# Patient Record
Sex: Female | Born: 1940 | Race: White | Hispanic: No | Smoking: Never smoker
Health system: Southern US, Community
[De-identification: ages and names within clinical notes are randomized; demographics above are authoritative.]

## PROBLEM LIST (undated history)

## (undated) DIAGNOSIS — R51 Headache: Secondary | ICD-10-CM

## (undated) DIAGNOSIS — E785 Hyperlipidemia, unspecified: Secondary | ICD-10-CM

## (undated) DIAGNOSIS — E210 Primary hyperparathyroidism: Secondary | ICD-10-CM

## (undated) DIAGNOSIS — M1990 Unspecified osteoarthritis, unspecified site: Secondary | ICD-10-CM

## (undated) DIAGNOSIS — E209 Hypoparathyroidism, unspecified: Secondary | ICD-10-CM

## (undated) DIAGNOSIS — L570 Actinic keratosis: Secondary | ICD-10-CM

## (undated) DIAGNOSIS — E079 Disorder of thyroid, unspecified: Secondary | ICD-10-CM

## (undated) DIAGNOSIS — I6529 Occlusion and stenosis of unspecified carotid artery: Secondary | ICD-10-CM

## (undated) DIAGNOSIS — E039 Hypothyroidism, unspecified: Secondary | ICD-10-CM

## (undated) DIAGNOSIS — T148XXA Other injury of unspecified body region, initial encounter: Secondary | ICD-10-CM

## (undated) HISTORY — DX: Occlusion and stenosis of unspecified carotid artery: I65.29

## (undated) HISTORY — DX: Disorder of thyroid, unspecified: E07.9

## (undated) HISTORY — DX: Hyperlipidemia, unspecified: E78.5

## (undated) HISTORY — DX: Other injury of unspecified body region, initial encounter: T14.8XXA

## (undated) HISTORY — DX: Actinic keratosis: L57.0

---

## 1998-11-26 HISTORY — PX: CARPAL TUNNEL RELEASE: SHX101

## 2003-09-15 LAB — HM COLONOSCOPY

## 2006-06-18 ENCOUNTER — Ambulatory Visit: Payer: Self-pay | Admitting: Unknown Physician Specialty

## 2007-06-24 ENCOUNTER — Ambulatory Visit: Payer: Self-pay | Admitting: Unknown Physician Specialty

## 2008-06-30 ENCOUNTER — Ambulatory Visit: Payer: Self-pay | Admitting: Unknown Physician Specialty

## 2008-09-11 DIAGNOSIS — T148XXA Other injury of unspecified body region, initial encounter: Secondary | ICD-10-CM

## 2008-09-11 HISTORY — DX: Other injury of unspecified body region, initial encounter: T14.8XXA

## 2008-11-26 LAB — HM DEXA SCAN

## 2009-07-26 ENCOUNTER — Ambulatory Visit: Payer: Self-pay | Admitting: Unknown Physician Specialty

## 2010-08-17 ENCOUNTER — Ambulatory Visit: Payer: Self-pay | Admitting: Unknown Physician Specialty

## 2010-08-17 LAB — HM MAMMOGRAPHY

## 2011-08-05 LAB — HM PAP SMEAR

## 2011-11-27 HISTORY — PX: ROTATOR CUFF REPAIR: SHX139

## 2011-12-03 ENCOUNTER — Ambulatory Visit (INDEPENDENT_AMBULATORY_CARE_PROVIDER_SITE_OTHER): Payer: Medicare Other | Admitting: Internal Medicine

## 2011-12-03 ENCOUNTER — Encounter: Payer: Self-pay | Admitting: Internal Medicine

## 2011-12-03 DIAGNOSIS — M8580 Other specified disorders of bone density and structure, unspecified site: Secondary | ICD-10-CM | POA: Insufficient documentation

## 2011-12-03 DIAGNOSIS — I8390 Asymptomatic varicose veins of unspecified lower extremity: Secondary | ICD-10-CM

## 2011-12-03 DIAGNOSIS — M899 Disorder of bone, unspecified: Secondary | ICD-10-CM

## 2011-12-03 DIAGNOSIS — M949 Disorder of cartilage, unspecified: Secondary | ICD-10-CM | POA: Diagnosis not present

## 2011-12-03 DIAGNOSIS — Z1211 Encounter for screening for malignant neoplasm of colon: Secondary | ICD-10-CM

## 2011-12-03 DIAGNOSIS — G43909 Migraine, unspecified, not intractable, without status migrainosus: Secondary | ICD-10-CM | POA: Insufficient documentation

## 2011-12-03 DIAGNOSIS — I8393 Asymptomatic varicose veins of bilateral lower extremities: Secondary | ICD-10-CM

## 2011-12-03 DIAGNOSIS — Z1239 Encounter for other screening for malignant neoplasm of breast: Secondary | ICD-10-CM

## 2011-12-03 DIAGNOSIS — Z124 Encounter for screening for malignant neoplasm of cervix: Secondary | ICD-10-CM

## 2011-12-03 MED ORDER — LEVOTHYROXINE SODIUM 88 MCG PO TABS: 88.0000 ug | ORAL_TABLET | Freq: Every day | ORAL | Status: DC

## 2011-12-03 NOTE — Assessment & Plan Note (Signed)
Last DEXA 2011 A [facility] [place]

## 2011-12-03 NOTE — Assessment & Plan Note (Signed)
Normal with internal hemorrhoids, asymptomatic 2004.
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
## 2011-12-03 NOTE — Assessment & Plan Note (Signed)
Asymptoamtic.  Recommended use of compression stockings

## 2011-12-03 NOTE — Patient Instructions (Addendum)
You can use up to 2 grams of tylenol daily for pain relief    You can   use up to 2400 mg of advil ( 200 mg tablets are OTC , you can use up to 4 OTC at a time,  Every 8 hours if needed)    Return in March or a[pril for fasting labs

## 2011-12-03 NOTE — Assessment & Plan Note (Signed)
Due for mammogram,  Last one sept 2011

## 2011-12-19 ENCOUNTER — Ambulatory Visit: Payer: Self-pay | Admitting: Unknown Physician Specialty

## 2011-12-19 DIAGNOSIS — R920 Mammographic microcalcification found on diagnostic imaging of breast: Secondary | ICD-10-CM | POA: Diagnosis not present

## 2011-12-19 DIAGNOSIS — Z1231 Encounter for screening mammogram for malignant neoplasm of breast: Secondary | ICD-10-CM | POA: Diagnosis not present

## 2011-12-31 DIAGNOSIS — L821 Other seborrheic keratosis: Secondary | ICD-10-CM | POA: Diagnosis not present

## 2011-12-31 DIAGNOSIS — D485 Neoplasm of uncertain behavior of skin: Secondary | ICD-10-CM | POA: Diagnosis not present

## 2011-12-31 DIAGNOSIS — L988 Other specified disorders of the skin and subcutaneous tissue: Secondary | ICD-10-CM | POA: Diagnosis not present

## 2012-01-31 ENCOUNTER — Encounter: Payer: Self-pay | Admitting: Internal Medicine

## 2012-02-11 ENCOUNTER — Encounter: Payer: Self-pay | Admitting: Internal Medicine

## 2012-02-13 ENCOUNTER — Telehealth: Payer: Self-pay | Admitting: Internal Medicine

## 2012-02-13 DIAGNOSIS — E785 Hyperlipidemia, unspecified: Secondary | ICD-10-CM

## 2012-02-13 DIAGNOSIS — E039 Hypothyroidism, unspecified: Secondary | ICD-10-CM

## 2012-02-13 DIAGNOSIS — Z79899 Other long term (current) drug therapy: Secondary | ICD-10-CM

## 2012-02-13 NOTE — Telephone Encounter (Signed)
Patient is coming in for fasting labs on April 11 th I don't see any labs in the system.

## 2012-02-14 NOTE — Telephone Encounter (Signed)
ordeers placed

## 2012-02-14 NOTE — Telephone Encounter (Signed)
Patient was told to come back for labs but there are no orders.   Please advise on what labs you want patient to have.

## 2012-03-06 ENCOUNTER — Other Ambulatory Visit: Payer: Medicare Other

## 2012-03-07 ENCOUNTER — Other Ambulatory Visit (INDEPENDENT_AMBULATORY_CARE_PROVIDER_SITE_OTHER): Payer: Medicare Other | Admitting: *Deleted

## 2012-03-07 DIAGNOSIS — E785 Hyperlipidemia, unspecified: Secondary | ICD-10-CM | POA: Diagnosis not present

## 2012-03-07 DIAGNOSIS — E039 Hypothyroidism, unspecified: Secondary | ICD-10-CM

## 2012-03-07 DIAGNOSIS — Z79899 Other long term (current) drug therapy: Secondary | ICD-10-CM

## 2012-03-07 LAB — COMPREHENSIVE METABOLIC PANEL
ALT: 20 U/L (ref 0–35)
AST: 23 U/L (ref 0–37)
Albumin: 4.1 g/dL (ref 3.5–5.2)
Alkaline Phosphatase: 67 U/L (ref 39–117)
BUN: 19 mg/dL (ref 6–23)
CO2: 30 mEq/L (ref 19–32)
Calcium: 10.3 mg/dL (ref 8.4–10.5)
Chloride: 105 mEq/L (ref 96–112)
Creatinine, Ser: 0.6 mg/dL (ref 0.4–1.2)
GFR: 97.8 mL/min (ref >60.00)
Glucose, Bld: 87 mg/dL (ref 70–99)
Potassium: 4.2 mEq/L (ref 3.5–5.1)
Sodium: 141 mEq/L (ref 135–145)
Total Bilirubin: 0.5 mg/dL (ref 0.3–1.2)
Total Protein: 6.8 g/dL (ref 6.0–8.3)

## 2012-03-07 LAB — LIPID PANEL
Cholesterol: 194 mg/dL (ref 0–200)
HDL: 69.2 mg/dL (ref >39.00)
LDL Cholesterol: 113 mg/dL — ABNORMAL HIGH (ref 0–99)
Total CHOL/HDL Ratio: 3
Triglycerides: 58 mg/dL (ref 0.0–149.0)
VLDL: 11.6 mg/dL (ref 0.0–40.0)

## 2012-03-07 LAB — TSH: TSH: 0.21 u[IU]/mL — ABNORMAL LOW (ref 0.35–5.50)

## 2012-03-09 MED ORDER — LEVOTHYROXINE SODIUM 75 MCG PO TABS: 75.0000 ug | ORAL_TABLET | Freq: Every day | ORAL | Status: DC

## 2012-03-09 NOTE — Progress Notes (Signed)
Addended by: Duncan Dull on: 03/09/2012 07:51 PM   Modules accepted: Orders

## 2012-05-19 ENCOUNTER — Other Ambulatory Visit: Payer: Self-pay | Admitting: Internal Medicine

## 2012-05-21 ENCOUNTER — Other Ambulatory Visit: Payer: Self-pay | Admitting: Internal Medicine

## 2012-08-27 ENCOUNTER — Telehealth: Payer: Self-pay

## 2012-08-27 DIAGNOSIS — E039 Hypothyroidism, unspecified: Secondary | ICD-10-CM

## 2012-08-27 DIAGNOSIS — Z79899 Other long term (current) drug therapy: Secondary | ICD-10-CM

## 2012-08-27 DIAGNOSIS — E785 Hyperlipidemia, unspecified: Secondary | ICD-10-CM

## 2012-08-27 NOTE — Telephone Encounter (Signed)
Patient is requesting a 90 day supply of Simvastatin 20 mg to last her until January when she can come in for a CPE. Ok to refill?

## 2012-08-28 MED ORDER — SIMVASTATIN 20 MG PO TABS: 20.0000 mg | ORAL_TABLET | Freq: Every evening | ORAL | Status: DC

## 2012-08-28 NOTE — Telephone Encounter (Signed)
Simvastatin  20 MG #90 0 R called in to [facility]

## 2012-08-28 NOTE — Telephone Encounter (Signed)
She can, if she agrees to come in for nonfasting labs this month ;  Needs a CMET and a TSH to monitor her Liver tests and prior thyroid medication dose change.  I will add orders to [facility]

## 2012-09-23 DIAGNOSIS — Z23 Encounter for immunization: Secondary | ICD-10-CM | POA: Diagnosis not present

## 2012-09-25 ENCOUNTER — Other Ambulatory Visit (INDEPENDENT_AMBULATORY_CARE_PROVIDER_SITE_OTHER): Payer: Medicare Other

## 2012-09-25 DIAGNOSIS — Z79899 Other long term (current) drug therapy: Secondary | ICD-10-CM

## 2012-09-25 DIAGNOSIS — E039 Hypothyroidism, unspecified: Secondary | ICD-10-CM | POA: Diagnosis not present

## 2012-09-25 LAB — COMPREHENSIVE METABOLIC PANEL
ALT: 19 U/L (ref 0–35)
AST: 25 U/L (ref 0–37)
Albumin: 3.8 g/dL (ref 3.5–5.2)
Alkaline Phosphatase: 50 U/L (ref 39–117)
BUN: 19 mg/dL (ref 6–23)
CO2: 27 mEq/L (ref 19–32)
Calcium: 10.1 mg/dL (ref 8.4–10.5)
Chloride: 105 mEq/L (ref 96–112)
Creatinine, Ser: 0.7 mg/dL (ref 0.4–1.2)
GFR: 82.58 mL/min (ref >60.00)
Glucose, Bld: 86 mg/dL (ref 70–99)
Potassium: 3.8 mEq/L (ref 3.5–5.1)
Sodium: 139 mEq/L (ref 135–145)
Total Bilirubin: 0.7 mg/dL (ref 0.3–1.2)
Total Protein: 6.6 g/dL (ref 6.0–8.3)

## 2012-09-25 LAB — TSH: TSH: 1.18 u[IU]/mL (ref 0.35–5.50)

## 2012-09-30 ENCOUNTER — Encounter: Payer: Self-pay | Admitting: *Deleted

## 2012-09-30 NOTE — Progress Notes (Signed)
Result letter mailed to patient's home address.

## 2012-10-02 ENCOUNTER — Telehealth: Payer: Self-pay | Admitting: Internal Medicine

## 2012-10-02 NOTE — Telephone Encounter (Signed)
Results of her lab work.

## 2012-10-02 NOTE — Telephone Encounter (Signed)
Left message on pateint vm with normal lab results.

## 2012-12-02 LAB — HM PAP SMEAR: HM Pap smear: NORMAL

## 2012-12-04 ENCOUNTER — Encounter: Payer: Self-pay | Admitting: Internal Medicine

## 2012-12-04 ENCOUNTER — Other Ambulatory Visit (HOSPITAL_COMMUNITY)
Admission: RE | Admit: 2012-12-04 | Discharge: 2012-12-04 | Disposition: A | Discharge status: Home / Self Care | Payer: Medicare Other | Source: Ambulatory Visit | Attending: Internal Medicine | Admitting: Internal Medicine

## 2012-12-04 ENCOUNTER — Ambulatory Visit (INDEPENDENT_AMBULATORY_CARE_PROVIDER_SITE_OTHER): Payer: Medicare Other | Admitting: Internal Medicine

## 2012-12-04 VITALS — BP 124/82 | HR 74 | Temp 98.2°F | Resp 16 | Ht 63.0 in | Wt 127.0 lb

## 2012-12-04 DIAGNOSIS — Z139 Encounter for screening, unspecified: Secondary | ICD-10-CM

## 2012-12-04 DIAGNOSIS — Z1239 Encounter for other screening for malignant neoplasm of breast: Secondary | ICD-10-CM

## 2012-12-04 DIAGNOSIS — M76899 Other specified enthesopathies of unspecified lower limb, excluding foot: Secondary | ICD-10-CM | POA: Diagnosis not present

## 2012-12-04 DIAGNOSIS — Z01419 Encounter for gynecological examination (general) (routine) without abnormal findings: Secondary | ICD-10-CM | POA: Diagnosis not present

## 2012-12-04 DIAGNOSIS — Z0000 Encounter for general adult medical examination without abnormal findings: Secondary | ICD-10-CM | POA: Diagnosis not present

## 2012-12-04 DIAGNOSIS — Z1211 Encounter for screening for malignant neoplasm of colon: Secondary | ICD-10-CM | POA: Diagnosis not present

## 2012-12-04 DIAGNOSIS — E785 Hyperlipidemia, unspecified: Secondary | ICD-10-CM | POA: Diagnosis not present

## 2012-12-04 DIAGNOSIS — Z1151 Encounter for screening for human papillomavirus (HPV): Secondary | ICD-10-CM | POA: Insufficient documentation

## 2012-12-04 DIAGNOSIS — Z124 Encounter for screening for malignant neoplasm of cervix: Secondary | ICD-10-CM

## 2012-12-04 DIAGNOSIS — M7062 Trochanteric bursitis, left hip: Secondary | ICD-10-CM

## 2012-12-04 MED ORDER — SIMVASTATIN 20 MG PO TABS: 20.0000 mg | ORAL_TABLET | Freq: Every evening | ORAL | Status: DC

## 2012-12-04 MED ORDER — TETANUS-DIPHTH-ACELL PERTUSSIS 5-2.5-18.5 LF-MCG/0.5 IM SUSP: 0.5000 mL | Freq: Once | INTRAMUSCULAR | Status: DC

## 2012-12-04 NOTE — Progress Notes (Signed)
Patient ID: Raven [name], female   DOB: 10-04-1941, 72 y.o.   MRN: [id number]        The patient is here for annual Medicare wellness examination and management of other chronic and acute problems. In 2012 had right rotator cuff surgery on the right shoulder,  Lately has been having pain with sleeping ,  aggravated by lying on shoulder. Using prn NSAIDs.  2) Right hip pain points to area of ischial tuberosity. Aggravated by recent prolonged car ride on a driving trip in [place]  Not aggravated by climbing stairs  But made worse by leg abduction of opposite side    The risk factors are reflected in the social history.  The roster of all physicians providing medical care to patient - is listed in the Snapshot section of the chart.  Activities of daily living:  The patient is 100% independent in all ADLs: dressing, toileting, feeding as well as independent mobility  Home safety : The patient has smoke detectors in the home. They wear seatbelts.  There are no firearms at home. There is no violence in the home.   There is no risks for hepatitis, STDs or HIV. There is no   history of blood transfusion. They have no travel history to infectious disease endemic areas of the world.  The patient has seen their dentist in the last six month. They have seen their eye doctor in the last year. They admit to slight hearing difficulty with regard to whispered voices and some television programs.  They have deferred audiologic testing in the last year.  They do not  have excessive sun exposure. Discussed the need for sun protection: hats, long sleeves and use of sunscreen if there is significant sun exposure.   Diet: the importance of a healthy diet is discussed. They do have a healthy diet.  The benefits of regular aerobic exercise were discussed. She walks 4 times per week ,  20 minutes.   Depression screen: there are no signs or vegative symptoms of depression- irritability, change in appetite, anhedonia,  sadness/tearfullness.  Cognitive assessment: the patient manages all their financial and personal affairs and is actively engaged. They could relate day,date,year and events; recalled 2/3 objects at 3 minutes; performed clock-face test normally.  The following portions of the patient's history were reviewed and updated as appropriate: allergies, current medications, past family history, past medical history,  past surgical history, past social history  and problem list.  Visual acuity was not assessed per patient preference since she has regular follow up with her ophthalmologist. Hearing and body mass index were assessed and reviewed.   During the course of the visit the patient was educated and counseled about appropriate screening and preventive services including : fall prevention , diabetes screening, nutrition counseling, colorectal cancer screening, and recommended immunizations.    Objective:     BP 124/82  Pulse 74  Temp 98.2 F (36.8 C) (Oral)  Resp 16  Ht 5\' 3"  (1.6 m)  Wt 127 lb (57.607 kg)  BMI 22.50 kg/m2  SpO2 98%  General appearance: alert, cooperative and appears stated age Eyes: conjunctivae/corneas clear. PERRL, EOM's intact. Fundi benign. Ears: normal TM's and external ear canals both ears Nose: Nares normal. Septum midline. Mucosa normal. No drainage or sinus tenderness. Neck: no adenopathy, no carotid bruit, no JVD, supple, symmetrical, trachea midline and thyroid not enlarged, symmetric, no tenderness/mass/nodules Lungs: clear to auscultation bilaterally Breasts: normal appearance, no masses or tenderness, No nipple retraction or dimpling Heart:  regular rate and rhythm, S1, S2 normal, no murmur, click, rub or gallop Abdomen: soft, non-tender; bowel sounds normal; no masses,  no organomegaly Pelvic: cervix normal in appearance, external genitalia normal, no adnexal masses or tenderness, no bladder tenderness, no cervical motion tenderness, rectovaginal septum  normal, uterus normal size, shape, and consistency and vagina normal without discharge Extremities: extremities normal, atraumatic, no cyanosis or edema Pulses: 2+ and symmetric Skin: Skin color, texture, turgor normal. No rashes or lesions Neurologic: Alert and oriented X 3, normal strength and tone. Normal symmetric reflexes. Normal coordination and gait   Assessment and Plan  Routine general medical examination at a health care facility Breast pelvic and Pap smear were done today. Her last Pap smear was 2010.  Screening for breast cancer Breast exam normal. Mammogram ordered.  Trochanteric bursitis of left hip Range of motion is normal,  straight leg lift is normal. Two week trial of NSAIDs recommended.   Updated Medication List Outpatient Encounter Prescriptions as of 12/04/2012  Medication Sig Dispense Refill  . CALCIUM CARBONATE PO Take 1260 mg by mouth daily       . cholecalciferol (VITAMIN D) 1000 UNITS tablet Take 1,000 Units by mouth daily.       Marland Kitchen levothyroxine (SYNTHROID, LEVOTHROID) 75 MCG tablet TAKE ONE TABLET BY MOUTH ONE TIME DAILY  90 tablet  2  . simvastatin (ZOCOR) 20 MG tablet Take 1 tablet (20 mg total) by mouth every evening.  90 tablet  2  . vitamin E 400 UNIT capsule Take 400 Units by mouth daily.        . [DISCONTINUED] levothyroxine (SYNTHROID, LEVOTHROID) 88 MCG tablet TAKE ONE TABLET BY MOUTH ONE TIME DAILY  30 tablet  6  . [DISCONTINUED] simvastatin (ZOCOR) 20 MG tablet Take 1 tablet (20 mg total) by mouth every evening.  90 tablet  0  . TDaP (BOOSTRIX) 5-2.5-18.5 LF-MCG/0.5 injection Inject 0.5 mLs into the muscle once.  0.5 mL  0  . [DISCONTINUED] TDaP (BOOSTRIX) 5-2.5-18.5 LF-MCG/0.5 injection Inject 0.5 mLs into the muscle once.  0.5 mL  0

## 2012-12-04 NOTE — Patient Instructions (Addendum)
Your hip pain may be due to trochanteric bursitis or bursitis of the ischial tuberosity.  I recommend a two week trial of NSAIDs (aleve twice daily) to see if it improves  You should protect your stomach with daily prevacid or prilosec,  Take in the am with your synthroid  If no improvement, we will get an x ray and consider alternative therapies, including seeing a sports medicine doctor.   You need a TdaP and a colonoscopy.  rx for vaccine  We will call you with a GI referral for colonosocpy   vascular referral  for carotid u/s To be arranged

## 2012-12-06 ENCOUNTER — Encounter: Payer: Self-pay | Admitting: Internal Medicine

## 2012-12-06 DIAGNOSIS — Z0000 Encounter for general adult medical examination without abnormal findings: Secondary | ICD-10-CM | POA: Insufficient documentation

## 2012-12-06 DIAGNOSIS — M7061 Trochanteric bursitis, right hip: Secondary | ICD-10-CM | POA: Insufficient documentation

## 2012-12-06 NOTE — Assessment & Plan Note (Signed)
Breast pelvic and Pap smear were done today. Her last Pap smear was 2010.

## 2012-12-06 NOTE — Assessment & Plan Note (Signed)
Breast exam normal. Mammogram ordered. 

## 2012-12-06 NOTE — Assessment & Plan Note (Addendum)
Range of motion is normal,  straight leg lift is normal. Two week trial of NSAIDs recommended.

## 2012-12-10 NOTE — Progress Notes (Signed)
Quick Note:  PAP smear was normal, HPV screen negative. Repeat in 3 years   ______

## 2012-12-22 LAB — HM MAMMOGRAPHY: HM Mammogram: NORMAL

## 2012-12-30 ENCOUNTER — Telehealth: Payer: Self-pay | Admitting: General Practice

## 2012-12-30 DIAGNOSIS — M7071 Other bursitis of hip, right hip: Secondary | ICD-10-CM

## 2012-12-30 NOTE — Telephone Encounter (Signed)
LM informing pt waiting for a return call in regards to the orthopedist.

## 2012-12-30 NOTE — Telephone Encounter (Signed)
She would definitiely benefit from a steroid hip injection.  I am happy to refer her to an orthopeidst.  Does she have a preference?

## 2012-12-30 NOTE — Telephone Encounter (Signed)
PT called stating that she has taken the Aleve as you requested for 2 weeks to alleviate hip pain. Stated you thought it was bursitis at her last OV. 3 days after stopping Aleve the pain came back. Pt would like to know if she should continue the Aleve or do you want to proceed with a referral. Please advise.

## 2012-12-31 ENCOUNTER — Ambulatory Visit: Payer: Self-pay | Admitting: Internal Medicine

## 2012-12-31 DIAGNOSIS — Z1231 Encounter for screening mammogram for malignant neoplasm of breast: Secondary | ICD-10-CM | POA: Diagnosis not present

## 2013-01-01 NOTE — Telephone Encounter (Signed)
Pt called again to ask about referral.

## 2013-01-02 NOTE — Telephone Encounter (Signed)
PT order in place.

## 2013-01-02 NOTE — Telephone Encounter (Signed)
Pt states she would like to try physical therapy at [facility] if possible.

## 2013-01-02 NOTE — Addendum Note (Signed)
Addended by: Sherlene Shams on: 01/02/2013 02:04 PM   Modules accepted: Orders

## 2013-01-06 DIAGNOSIS — M7150 Other bursitis, not elsewhere classified, unspecified site: Secondary | ICD-10-CM | POA: Diagnosis not present

## 2013-01-06 DIAGNOSIS — M2559 Pain in other specified joint: Secondary | ICD-10-CM | POA: Diagnosis not present

## 2013-01-09 DIAGNOSIS — M2559 Pain in other specified joint: Secondary | ICD-10-CM | POA: Diagnosis not present

## 2013-01-09 DIAGNOSIS — M7150 Other bursitis, not elsewhere classified, unspecified site: Secondary | ICD-10-CM | POA: Diagnosis not present

## 2013-01-10 ENCOUNTER — Other Ambulatory Visit: Payer: Self-pay

## 2013-01-12 DIAGNOSIS — M2559 Pain in other specified joint: Secondary | ICD-10-CM | POA: Diagnosis not present

## 2013-01-12 DIAGNOSIS — M7150 Other bursitis, not elsewhere classified, unspecified site: Secondary | ICD-10-CM | POA: Diagnosis not present

## 2013-01-13 DIAGNOSIS — M2559 Pain in other specified joint: Secondary | ICD-10-CM | POA: Diagnosis not present

## 2013-01-13 DIAGNOSIS — M7150 Other bursitis, not elsewhere classified, unspecified site: Secondary | ICD-10-CM | POA: Diagnosis not present

## 2013-01-15 DIAGNOSIS — M2559 Pain in other specified joint: Secondary | ICD-10-CM | POA: Diagnosis not present

## 2013-01-15 DIAGNOSIS — M7150 Other bursitis, not elsewhere classified, unspecified site: Secondary | ICD-10-CM | POA: Diagnosis not present

## 2013-01-16 ENCOUNTER — Telehealth: Payer: Self-pay | Admitting: Internal Medicine

## 2013-01-16 NOTE — Telephone Encounter (Signed)
Patient Information:  Caller Name: Wrigley  Phone: [phone]  Patient: Raven [name], Raven [name]  Gender: Female  DOB: [birth year]  Age: 72 Years  PCP: Duncan Dull (Adults only)  Office Follow Up:  Does the office need to follow up with this patient?: No  Instructions For The Office: N/A   Symptoms  Reason For Call & Symptoms: Wearing steroid patch daily for 5-6 hours to treat Bursistis and when she removed it this morning and skin was red and raised in the outline of the adhesive but not itchy-01/16/13. Still has faint outline of rash this afternoon but no swelling.  Reviewed Health History In EMR: Yes  Reviewed Medications In EMR: Yes  Reviewed Allergies In EMR: Yes  Reviewed Surgeries / Procedures: Yes  Date of Onset of Symptoms: 01/16/2013  Treatments Tried: Washed area, ice applied  Treatments Tried Worked: Yes  Guideline(s) Used:  Rash or Redness - Localized  Disposition Per Guideline:   Home Care  Reason For Disposition Reached:   Mild localized rash  Advice Given: Wash area with mild soap and water after adhesive removed.  Apply Cold to the Area:  Apply or soak in cold water for 20 minutes every 3 to 4 hours to reduce itching or pain.  Call Back If:  Rash spreads or becomes worse  Rash lasts longer than 1 week  You become worse.  Expected Course:  Most of these rashes pass in 2 to 3 days.

## 2013-01-18 DIAGNOSIS — M2559 Pain in other specified joint: Secondary | ICD-10-CM | POA: Diagnosis not present

## 2013-01-18 DIAGNOSIS — M7150 Other bursitis, not elsewhere classified, unspecified site: Secondary | ICD-10-CM | POA: Diagnosis not present

## 2013-01-19 ENCOUNTER — Encounter: Payer: Self-pay | Admitting: Internal Medicine

## 2013-01-20 DIAGNOSIS — M7150 Other bursitis, not elsewhere classified, unspecified site: Secondary | ICD-10-CM | POA: Diagnosis not present

## 2013-01-20 DIAGNOSIS — M2559 Pain in other specified joint: Secondary | ICD-10-CM | POA: Diagnosis not present

## 2013-01-21 ENCOUNTER — Telehealth: Payer: Self-pay | Admitting: Internal Medicine

## 2013-01-21 NOTE — Telephone Encounter (Signed)
Fixed.

## 2013-01-21 NOTE — Telephone Encounter (Signed)
Patient was looking at her my chart and notice that it mentioned her left hip having bursitis under diagnosis and not her right she is wanting this corrected in case the insurance company is needing any of this information.

## 2013-01-22 ENCOUNTER — Telehealth: Payer: Self-pay | Admitting: Internal Medicine

## 2013-01-22 DIAGNOSIS — M2559 Pain in other specified joint: Secondary | ICD-10-CM | POA: Diagnosis not present

## 2013-01-22 DIAGNOSIS — M7150 Other bursitis, not elsewhere classified, unspecified site: Secondary | ICD-10-CM | POA: Diagnosis not present

## 2013-01-22 NOTE — Telephone Encounter (Signed)
Patient stated that she did not call about labs but a correction in my chart and it was taken care of.

## 2013-01-22 NOTE — Telephone Encounter (Signed)
Pt called to get results.  °

## 2013-01-26 DIAGNOSIS — M2559 Pain in other specified joint: Secondary | ICD-10-CM | POA: Diagnosis not present

## 2013-01-28 DIAGNOSIS — M2559 Pain in other specified joint: Secondary | ICD-10-CM | POA: Diagnosis not present

## 2013-01-30 DIAGNOSIS — M2559 Pain in other specified joint: Secondary | ICD-10-CM | POA: Diagnosis not present

## 2013-02-02 DIAGNOSIS — M2559 Pain in other specified joint: Secondary | ICD-10-CM | POA: Diagnosis not present

## 2013-02-04 DIAGNOSIS — M2559 Pain in other specified joint: Secondary | ICD-10-CM | POA: Diagnosis not present

## 2013-02-06 DIAGNOSIS — M2559 Pain in other specified joint: Secondary | ICD-10-CM | POA: Diagnosis not present

## 2013-07-01 ENCOUNTER — Other Ambulatory Visit: Payer: Self-pay

## 2013-07-15 ENCOUNTER — Other Ambulatory Visit: Payer: Self-pay | Admitting: Internal Medicine

## 2013-08-25 ENCOUNTER — Other Ambulatory Visit: Payer: Self-pay | Admitting: Internal Medicine

## 2013-08-28 DIAGNOSIS — Z23 Encounter for immunization: Secondary | ICD-10-CM | POA: Diagnosis not present

## 2013-09-01 ENCOUNTER — Encounter: Payer: Self-pay | Admitting: Internal Medicine

## 2013-09-01 ENCOUNTER — Ambulatory Visit (INDEPENDENT_AMBULATORY_CARE_PROVIDER_SITE_OTHER): Payer: Medicare Other | Admitting: Internal Medicine

## 2013-09-01 VITALS — BP 126/74 | HR 66 | Temp 97.6°F | Resp 14 | Ht 63.0 in | Wt 131.5 lb

## 2013-09-01 DIAGNOSIS — E559 Vitamin D deficiency, unspecified: Secondary | ICD-10-CM | POA: Diagnosis not present

## 2013-09-01 DIAGNOSIS — E785 Hyperlipidemia, unspecified: Secondary | ICD-10-CM | POA: Insufficient documentation

## 2013-09-01 DIAGNOSIS — Z1211 Encounter for screening for malignant neoplasm of colon: Secondary | ICD-10-CM

## 2013-09-01 DIAGNOSIS — E039 Hypothyroidism, unspecified: Secondary | ICD-10-CM

## 2013-09-01 DIAGNOSIS — Z79899 Other long term (current) drug therapy: Secondary | ICD-10-CM

## 2013-09-01 DIAGNOSIS — Z124 Encounter for screening for malignant neoplasm of cervix: Secondary | ICD-10-CM

## 2013-09-01 LAB — LIPID PANEL
Cholesterol: 208 mg/dL — ABNORMAL HIGH (ref 0–200)
HDL: 81.7 mg/dL (ref >39.00)
Total CHOL/HDL Ratio: 3
Triglycerides: 53 mg/dL (ref 0.0–149.0)
VLDL: 10.6 mg/dL (ref 0.0–40.0)

## 2013-09-01 LAB — COMPREHENSIVE METABOLIC PANEL
ALT: 21 U/L (ref 0–35)
AST: 31 U/L (ref 0–37)
Albumin: 4.2 g/dL (ref 3.5–5.2)
Alkaline Phosphatase: 50 U/L (ref 39–117)
BUN: 18 mg/dL (ref 6–23)
CO2: 28 mEq/L (ref 19–32)
Calcium: 11 mg/dL — ABNORMAL HIGH (ref 8.4–10.5)
Chloride: 108 mEq/L (ref 96–112)
Creatinine, Ser: 0.7 mg/dL (ref 0.4–1.2)
GFR: 82.36 mL/min (ref >60.00)
Glucose, Bld: 88 mg/dL (ref 70–99)
Potassium: 4.3 mEq/L (ref 3.5–5.1)
Sodium: 142 mEq/L (ref 135–145)
Total Bilirubin: 0.5 mg/dL (ref 0.3–1.2)
Total Protein: 6.6 g/dL (ref 6.0–8.3)

## 2013-09-01 LAB — LDL CHOLESTEROL, DIRECT: Direct LDL: 107.6 mg/dL

## 2013-09-01 LAB — TSH: TSH: 1.36 u[IU]/mL (ref 0.35–5.50)

## 2013-09-01 MED ORDER — ZOSTER VACCINE LIVE 19400 UNT/0.65ML ~~LOC~~ SOLR: 0.6500 mL | Freq: Once | SUBCUTANEOUS | Status: DC

## 2013-09-01 MED ORDER — TETANUS-DIPHTH-ACELL PERTUSSIS 5-2.5-18.5 LF-MCG/0.5 IM SUSP: 0.5000 mL | Freq: Once | INTRAMUSCULAR | Status: DC

## 2013-09-01 NOTE — Assessment & Plan Note (Signed)
Scheduled for 10 yr follow up this month

## 2013-09-01 NOTE — Patient Instructions (Addendum)
If your cholesterol remains under 130 on simvastatin,  It would be dine to suspend it for 6 months and check your untreated lipids in 6 months with your next annual exam   I recommend that you have your tetanus-diptheria-pertussis vaccine (TDaP) and Singles vaccines but you can get them for less $$$ at a local pharmacy or the [facility] the [facility] I have provided you.   Return in April for your annual exam
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
## 2013-09-01 NOTE — Assessment & Plan Note (Signed)
LDL is 107 and HDL is 81 on simvastatin. Liver enzymes are normal  Given her lack of cardiac risk factors, I think suspending the statin for 6 months and evaluating untreated repeating fasting lipids would be appropriate .

## 2013-09-01 NOTE — Assessment & Plan Note (Signed)
Normal PAp smear Jan 2014

## 2013-09-02 ENCOUNTER — Encounter: Payer: Self-pay | Admitting: Internal Medicine

## 2013-09-02 LAB — VITAMIN D 25 HYDROXY (VIT D DEFICIENCY, FRACTURES): Vit D, 25-Hydroxy: 45 ng/mL (ref 30–89)

## 2013-09-02 NOTE — Addendum Note (Signed)
Addended by: Sherlene Shams on: 09/02/2013 06:35 AM   Modules accepted: Orders

## 2013-09-04 NOTE — Telephone Encounter (Signed)
Mailed unread message to pt  

## 2013-10-01 ENCOUNTER — Other Ambulatory Visit: Payer: Self-pay

## 2013-10-14 ENCOUNTER — Other Ambulatory Visit (INDEPENDENT_AMBULATORY_CARE_PROVIDER_SITE_OTHER): Payer: Medicare Other

## 2013-10-14 ENCOUNTER — Telehealth: Payer: Self-pay | Admitting: *Deleted

## 2013-10-14 DIAGNOSIS — E785 Hyperlipidemia, unspecified: Secondary | ICD-10-CM

## 2013-10-14 LAB — LDL CHOLESTEROL, DIRECT: Direct LDL: 163 mg/dL

## 2013-10-14 LAB — LIPID PANEL
Cholesterol: 250 mg/dL — ABNORMAL HIGH (ref 0–200)
HDL: 73.7 mg/dL (ref >39.00)
Total CHOL/HDL Ratio: 3
Triglycerides: 46 mg/dL (ref 0.0–149.0)
VLDL: 9.2 mg/dL (ref 0.0–40.0)

## 2013-10-14 LAB — BASIC METABOLIC PANEL
BUN: 13 mg/dL (ref 6–23)
CO2: 28 mEq/L (ref 19–32)
Calcium: 10.2 mg/dL (ref 8.4–10.5)
Chloride: 106 mEq/L (ref 96–112)
Creatinine, Ser: 0.7 mg/dL (ref 0.4–1.2)
GFR: 82.33 mL/min (ref >60.00)
Glucose, Bld: 95 mg/dL (ref 70–99)
Potassium: 4.3 mEq/L (ref 3.5–5.1)
Sodium: 139 mEq/L (ref 135–145)

## 2013-10-14 NOTE — Telephone Encounter (Signed)
Patient came in for labs this morning and stated she need a refill on her thyroid medication sent to the pharmacy on file.

## 2013-10-15 ENCOUNTER — Other Ambulatory Visit: Payer: Self-pay | Admitting: Internal Medicine

## 2013-10-15 LAB — PARATHYROID HORMONE, INTACT (NO CA): PTH: 144.5 pg/mL — ABNORMAL HIGH (ref 14.0–72.0)

## 2013-10-15 LAB — CALCIUM, IONIZED

## 2013-10-15 MED ORDER — LEVOTHYROXINE SODIUM 75 MCG PO TABS: ORAL_TABLET | ORAL | Status: DC

## 2013-10-15 NOTE — Telephone Encounter (Signed)
Rx sent to pharmacy by escript  

## 2013-10-16 LAB — CALCIUM, IONIZED: Calcium, Ion: 1.28 mmol/L (ref 1.12–1.32)

## 2013-10-18 ENCOUNTER — Encounter: Payer: Self-pay | Admitting: Internal Medicine

## 2013-10-18 DIAGNOSIS — E213 Hyperparathyroidism, unspecified: Secondary | ICD-10-CM

## 2013-10-19 MED ORDER — SIMVASTATIN 20 MG PO TABS: ORAL_TABLET | ORAL | Status: DC

## 2013-10-21 ENCOUNTER — Encounter: Payer: Self-pay | Admitting: Emergency Medicine

## 2013-11-02 LAB — HM COLONOSCOPY

## 2013-11-03 DIAGNOSIS — K5730 Diverticulosis of large intestine without perforation or abscess without bleeding: Secondary | ICD-10-CM | POA: Diagnosis not present

## 2013-11-03 DIAGNOSIS — K648 Other hemorrhoids: Secondary | ICD-10-CM | POA: Diagnosis not present

## 2013-11-03 DIAGNOSIS — Z1211 Encounter for screening for malignant neoplasm of colon: Secondary | ICD-10-CM | POA: Diagnosis not present

## 2013-11-03 LAB — HM COLONOSCOPY

## 2013-11-12 ENCOUNTER — Encounter: Payer: Self-pay | Admitting: *Deleted

## 2013-11-17 NOTE — Telephone Encounter (Signed)
Mailed unread message to pt  

## 2013-12-16 ENCOUNTER — Encounter: Payer: Self-pay | Admitting: Internal Medicine

## 2013-12-16 ENCOUNTER — Ambulatory Visit (INDEPENDENT_AMBULATORY_CARE_PROVIDER_SITE_OTHER): Payer: Medicare Other | Admitting: Internal Medicine

## 2013-12-16 VITALS — BP 118/72 | HR 65 | Temp 98.0°F | Resp 12 | Ht 63.0 in | Wt 127.4 lb

## 2013-12-16 DIAGNOSIS — E213 Hyperparathyroidism, unspecified: Secondary | ICD-10-CM | POA: Diagnosis not present

## 2013-12-16 LAB — MAGNESIUM: Magnesium: 2.1 mg/dL (ref 1.5–2.5)

## 2013-12-16 LAB — PHOSPHORUS: Phosphorus: 2.8 mg/dL (ref 2.3–4.6)

## 2013-12-16 NOTE — Patient Instructions (Signed)
Please return in 3 months. Please stop at the lab. I will send you the results through [place]. Please start 500 mg calcium supplement daily with a meal >> ideally dinner.
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
## 2013-12-17 LAB — VITAMIN D 25 HYDROXY (VIT D DEFICIENCY, FRACTURES): Vit D, 25-Hydroxy: 46 ng/mL (ref 30–89)

## 2013-12-18 LAB — PTH, INTACT AND CALCIUM
Calcium: 10.6 mg/dL — ABNORMAL HIGH (ref 8.7–10.3)
PTH: 58 pg/mL (ref 15–65)

## 2013-12-19 LAB — VITAMIN D 1,25 DIHYDROXY
Vitamin D 1, 25 (OH)2 Total: 81 pg/mL — ABNORMAL HIGH (ref 18–72)
Vitamin D2 1, 25 (OH)2: <8 pg/mL
Vitamin D3 1, 25 (OH)2: 81 pg/mL

## 2013-12-25 NOTE — Addendum Note (Signed)
Addended by: Philemon Kingdom on: 12/25/2013 03:50 PM   Modules accepted: Orders

## 2013-12-31 DIAGNOSIS — H2510 Age-related nuclear cataract, unspecified eye: Secondary | ICD-10-CM | POA: Diagnosis not present

## 2014-01-04 ENCOUNTER — Encounter (INDEPENDENT_AMBULATORY_CARE_PROVIDER_SITE_OTHER): Payer: Medicare Other | Admitting: Surgery

## 2014-01-06 ENCOUNTER — Encounter (INDEPENDENT_AMBULATORY_CARE_PROVIDER_SITE_OTHER): Payer: Self-pay | Admitting: Surgery

## 2014-01-06 ENCOUNTER — Ambulatory Visit (INDEPENDENT_AMBULATORY_CARE_PROVIDER_SITE_OTHER): Payer: Medicare Other | Admitting: Surgery

## 2014-01-06 ENCOUNTER — Telehealth (INDEPENDENT_AMBULATORY_CARE_PROVIDER_SITE_OTHER): Payer: Self-pay

## 2014-01-06 DIAGNOSIS — E349 Endocrine disorder, unspecified: Secondary | ICD-10-CM | POA: Diagnosis not present
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
## 2014-01-06 NOTE — Telephone Encounter (Signed)
Order in epic for parathyroid scan. To Stevie to set up and call pt.

## 2014-01-06 NOTE — Patient Instructions (Signed)
Parathyroidectomy A parathyroidectomy is surgery to remove one or more parathyroid glands. These glands produce a hormone (parathyroid hormone) that helps control the level of calcium in your body. The glands are very small, about the size of a pea. They are located in your neck, close to your thyroid gland and your Adam's apple. Most people (85%) have four parathyroid glands,some people may have one or two more than that. Hyperparathyroidism is when too much parathyroid hormone is being produced. Usually this is caused by one of the parathyroid glands becoming enlarged, but it can also be caused by more than one of the glands. Hyperparathyroidism is found during blood tests that show high calcium in the blood. Parathyroid hormone levels will also be elevated. Cancer also can cause hyperparathyroidism, but this is rare. For the most common type of hyperparathyroidism, the treatment is surgical removal of the parathyroid gland that is enlarged. For patients with kidney failure and hyperparathyroidism, other treatment will be tried before surgery is done on the parathyroid.  Many times x-ray studies are done to find out which parathyroid gland or glands is malfunctioning. The decision about the best treatment for hyperparathyroidism is between the patient, their primary doctor, an endocrinologist, and a surgeon experienced in parathyroid surgery. LET YOUR CAREGIVER KNOW ABOUT:  Any allergies.  All medications you are taking, including:  Herbs, eyedrops, over-the-counter medications and creams.  Blood thinners (anticoagulants), aspirin or other drugs that could affect blood clotting.  Use of steroids (by mouth or as creams).  Previous problems with anesthetics, including local anesthetics.  Possibility of pregnancy, if this applies.  Any history of blood clots.  Any history of bleeding or other blood problems.  Previous surgery.  Smoking history.  Other health problems. RISKS AND  COMPLICATIONS   Short-term possibilities include:  Excessive bleeding.  Pain.  Infection near the incision.  Slow healing.  Pooling of blood under the wound (hematoma).  Damage to nerves in your neck.  Blood clots.  Difficulty breathing. This is very rare. It also is almost always temporary.  Longer-term possibilities include:  Scarring.  Skin damage.  Damage to blood vessels in the area.  Need for additional surgery.  A hoarse or weak voice. This is usually temporary. It can be the result of nerve damage.  Development of hypoparathyroidism. This means you are not making enough parathyroid hormone. It is rare. If it occurs, you will need to take calcium supplements daily. BEFORE THE PROCEDURE  Sometimes the surgery is done on an outpatient basis. This means you could go home the same day as your surgery. Other times, people need to stay in the hospital overnight. Ask your surgeon what you should expect.  If your surgery will be an outpatient procedure, arrange for someone to drive you home after the surgery.  Two weeks before your surgery, stop using aspirin and non-steroidal anti-inflammatory drugs (NSAID's) for pain relief. This includes prescription drugs and over-the-counter drugs such as ibuprofen and naproxen. Also stop taking vitamin E.  If you take blood-thinners, ask your healthcare provider when you should stop taking them.  Do not eat or drink for about 8 hours before your surgery.  You might be asked to shower or wash with a special antibacterial soap before the procedure.  Arrive at least an hour before the surgery, or whenever your surgeon recommends. This will give you time to check in and fill out any needed paperwork. PROCEDURE  The preparation:  You will change into a hospital gown.  You  will be given an IV. A needle will be inserted in your arm. Medication will be able to flow directly into your body through this needle.  You might be given a  sedative to help you relax.  You will be given a drug that puts you to sleep during the surgery (general anesthetic).  The procedure:  Once you are asleep, the surgeon will make a small cut (incision) in your lower neck. Ask your surgeon where the incision will be.  The surgeon will look for the gland(s) that are not working well. Often a tissue sample from a gland is used to determine this.  Any glands that are not working well will be removed.  The surgeon will close the incision with stitches, often these are hidden under the skin. AFTER THE PROCEDURE  You will stay in a recovery area until the anesthesia has worn off. Your blood pressure and heart rate will be checked.  If your surgery was an outpatient procedure, you will go home the same day.  If you need to stay in the hospital, you will be moved to a hospital room. You will probably stay for two to three days. This will depend on how quickly you recover.  While you are in the hospital, your blood will be tested to check the calcium levels in your body. HOME CARE INSTRUCTIONS   Take any medication that your surgeon prescribes. Follow the directions carefully. Take all of the medication.  Ask your surgeon whether you can take over-the-counter medicines for pain, discomfort or fever. Do not take aspirin without permission from the surgeon. Aspirin increases the chances of bleeding.  Do not get the wound wet for the first few days after surgery (or until the surgeon tells you it is OK).  After this procedure, many patients may develop low calcium levels in the blood. It is critical that you see your medical caregiver to have this monitored and managed.     SEEK MEDICAL CARE IF:   You notice blood or fluid leaking from the wound, or it becomes red or swollen.  You have trouble breathing.  You have trouble speaking.  You become nauseous or throw up for more than two days after the surgery.  You have a fever or  persistent symptoms for more than 2-3 days. SEEK IMMEDIATE MEDICAL CARE IF:   Breathing becomes more difficult.  You have a fever and your symptoms suddenly get worse. Document Released: 02/08/2009 Document Revised: 10/29/2012 Document Reviewed: 02/08/2009 [facility] Patient Information 2014 [place], [place].

## 2014-01-07 ENCOUNTER — Encounter (INDEPENDENT_AMBULATORY_CARE_PROVIDER_SITE_OTHER): Payer: Self-pay | Admitting: Surgery

## 2014-01-07 ENCOUNTER — Encounter: Payer: Self-pay | Admitting: Internal Medicine

## 2014-01-07 ENCOUNTER — Telehealth (INDEPENDENT_AMBULATORY_CARE_PROVIDER_SITE_OTHER): Payer: Self-pay | Admitting: *Deleted

## 2014-01-07 NOTE — Telephone Encounter (Signed)
I spoke with pt and informed her of the parathyroid scan scheduled at [facility] on 01/19/14 with an arrival time of 7:45am.  She is agreeable with this appt.

## 2014-01-08 DIAGNOSIS — E349 Endocrine disorder, unspecified: Secondary | ICD-10-CM | POA: Diagnosis not present

## 2014-01-08 LAB — CALCIUM, URINE, 24 HOUR
Calcium, 24H Urine: 436.8 mg/24 hr — ABNORMAL HIGH (ref 100.0–300.0)
Calcium, Ur: 27.3 mg/dL

## 2014-01-11 NOTE — Telephone Encounter (Signed)
FYI

## 2014-01-12 NOTE — Progress Notes (Signed)
Quick Note:  24-hour urine calcium markedly elevated consistent with primary hyperparathyroidism.  Await nuclear med scan.  Earnstine Regal, MD, [facility] Office: [phone]    ______

## 2014-01-15 ENCOUNTER — Encounter: Payer: Self-pay | Admitting: Internal Medicine

## 2014-01-19 ENCOUNTER — Encounter (HOSPITAL_COMMUNITY)
Admission: RE | Admit: 2014-01-19 | Discharge: 2014-01-19 | Disposition: A | Discharge status: Home / Self Care | Payer: Medicare Other | Source: Ambulatory Visit | Attending: Surgery | Admitting: Surgery

## 2014-01-19 DIAGNOSIS — E213 Hyperparathyroidism, unspecified: Secondary | ICD-10-CM | POA: Diagnosis not present

## 2014-01-19 DIAGNOSIS — E349 Endocrine disorder, unspecified: Secondary | ICD-10-CM

## 2014-01-19 DIAGNOSIS — E0590 Thyrotoxicosis, unspecified without thyrotoxic crisis or storm: Secondary | ICD-10-CM | POA: Diagnosis not present

## 2014-01-19 MED ORDER — TECHNETIUM TC 99M SESTAMIBI - CARDIOLITE
24.3000 | Freq: Once | INTRAVENOUS | Status: AC | PRN
  Administered 2014-01-19: 24 via INTRAVENOUS

## 2014-01-20 ENCOUNTER — Telehealth (INDEPENDENT_AMBULATORY_CARE_PROVIDER_SITE_OTHER): Payer: Self-pay | Admitting: Surgery

## 2014-01-20 ENCOUNTER — Telehealth (INDEPENDENT_AMBULATORY_CARE_PROVIDER_SITE_OTHER): Payer: Self-pay

## 2014-01-20 ENCOUNTER — Other Ambulatory Visit (INDEPENDENT_AMBULATORY_CARE_PROVIDER_SITE_OTHER): Payer: Self-pay | Admitting: Surgery

## 2014-01-20 DIAGNOSIS — E349 Endocrine disorder, unspecified: Secondary | ICD-10-CM

## 2014-01-20 NOTE — Telephone Encounter (Signed)
Orders for surgery in epic and surgery scheduling form to surgery schedulers to call pt.

## 2014-01-20 NOTE — Progress Notes (Signed)
Quick Note:  Scan localizes left inferior parathyroid adenoma. Patient is good candidate for out-patient surgery. Will proceed with scheduling.  Earnstine Regal, MD, [facility] Office: [phone]    ______

## 2014-01-20 NOTE — Telephone Encounter (Signed)
Telephone call to patient with results of 24-hour urine collection for calcium and nuclear medicine parathyroid scan. 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 Patient is a good candidate for minimally invasive parathyroidectomy as an outpatient surgery. We will schedule that procedure in the near future at a time convenient for the patient.  The risks and benefits of the procedure have been discussed at length with the patient.  The patient understands the proposed procedure, potential alternative treatments, and the course of recovery to be expected.  All of the patient's questions have been answered at this time.  The patient wishes to proceed with surgery.  Earnstine Regal, MD, [facility] Office: [phone]

## 2014-01-25 ENCOUNTER — Telehealth (INDEPENDENT_AMBULATORY_CARE_PROVIDER_SITE_OTHER): Payer: Self-pay | Admitting: Surgery

## 2014-01-25 DIAGNOSIS — E349 Endocrine disorder, unspecified: Secondary | ICD-10-CM

## 2014-01-25 NOTE — Telephone Encounter (Signed)
Telephone call to patient in order to answer her questions.  No answer.  Left message with instructions to call office on 3/4 and talk to my nurse.  Earnstine Regal, MD, [facility] Office: [phone]

## 2014-01-27 ENCOUNTER — Telehealth (INDEPENDENT_AMBULATORY_CARE_PROVIDER_SITE_OTHER): Payer: Self-pay | Admitting: General Surgery

## 2014-01-27 NOTE — Telephone Encounter (Signed)
Pt called to ask what dosage of calcium and Vitamin D she needs to be taking at this point.  Please advise and call pt.

## 2014-01-29 NOTE — Telephone Encounter (Signed)
Patient on schedule for parathyroidectomy.  Does not need to take supplemental calcium, but can take 600 mg daily if she wants.  Vit D supplement should be 1000 IU daily.  Earnstine Regal, MD, [facility] Office: [phone]

## 2014-01-29 NOTE — Telephone Encounter (Signed)
Pt advised attached msg below. Pt states she understands and will follow Dr Gala Lewandowsky recommendation.

## 2014-02-15 ENCOUNTER — Encounter (INDEPENDENT_AMBULATORY_CARE_PROVIDER_SITE_OTHER): Payer: Self-pay

## 2014-02-24 ENCOUNTER — Encounter (INDEPENDENT_AMBULATORY_CARE_PROVIDER_SITE_OTHER): Payer: Medicare Other | Admitting: Surgery

## 2014-03-05 ENCOUNTER — Encounter (INDEPENDENT_AMBULATORY_CARE_PROVIDER_SITE_OTHER): Payer: Self-pay

## 2014-03-11 ENCOUNTER — Encounter (HOSPITAL_COMMUNITY): Payer: Self-pay

## 2014-03-11 ENCOUNTER — Encounter (HOSPITAL_COMMUNITY): Payer: Self-pay | Admitting: Pharmacy Technician

## 2014-03-11 ENCOUNTER — Ambulatory Visit (HOSPITAL_COMMUNITY)
Admission: RE | Admit: 2014-03-11 | Discharge: 2014-03-11 | Disposition: A | Discharge status: Home / Self Care | Payer: Medicare Other | Source: Ambulatory Visit | Attending: Anesthesiology | Admitting: Anesthesiology

## 2014-03-11 ENCOUNTER — Encounter (HOSPITAL_COMMUNITY)
Admission: RE | Admit: 2014-03-11 | Discharge: 2014-03-11 | Disposition: A | Discharge status: Home / Self Care | Payer: Medicare Other | Source: Ambulatory Visit | Attending: Surgery | Admitting: Surgery

## 2014-03-11 DIAGNOSIS — Z01818 Encounter for other preprocedural examination: Secondary | ICD-10-CM | POA: Diagnosis not present

## 2014-03-11 DIAGNOSIS — Z01810 Encounter for preprocedural cardiovascular examination: Secondary | ICD-10-CM | POA: Diagnosis not present

## 2014-03-11 DIAGNOSIS — Z01812 Encounter for preprocedural laboratory examination: Secondary | ICD-10-CM | POA: Diagnosis not present

## 2014-03-11 HISTORY — DX: Unspecified osteoarthritis, unspecified site: M19.90

## 2014-03-11 HISTORY — DX: Headache: R51

## 2014-03-11 HISTORY — DX: Hypoparathyroidism, unspecified: E20.9

## 2014-03-11 HISTORY — DX: Hypothyroidism, unspecified: E03.9

## 2014-03-11 HISTORY — DX: Primary hyperparathyroidism: E21.0

## 2014-03-11 LAB — CBC
HCT: 39.9 % (ref 36.0–46.0)
Hemoglobin: 13.1 g/dL (ref 12.0–15.0)
MCH: 31.4 pg (ref 26.0–34.0)
MCHC: 32.8 g/dL (ref 30.0–36.0)
MCV: 95.7 fL (ref 78.0–100.0)
Platelets: 232 10*3/uL (ref 150–400)
RBC: 4.17 MIL/uL (ref 3.87–5.11)
RDW: 12.9 % (ref 11.5–15.5)
WBC: 5.1 10*3/uL (ref 4.0–10.5)

## 2014-03-11 LAB — BASIC METABOLIC PANEL
BUN: 15 mg/dL (ref 6–23)
CO2: 30 mEq/L (ref 19–32)
Calcium: 11 mg/dL — ABNORMAL HIGH (ref 8.4–10.5)
Chloride: 103 mEq/L (ref 96–112)
Creatinine, Ser: 0.8 mg/dL (ref 0.50–1.10)
GFR calc Af Amer: 85 mL/min — ABNORMAL LOW (ref >90)
GFR calc non Af Amer: 73 mL/min — ABNORMAL LOW (ref >90)
Glucose, Bld: 96 mg/dL (ref 70–99)
Potassium: 4.7 mEq/L (ref 3.7–5.3)
Sodium: 142 mEq/L (ref 137–147)

## 2014-03-11 NOTE — Patient Instructions (Signed)
NO ASPIRIN OR HERBALS FOR 5 DAYS BEFORE SURGERY.  YOUR SURGERY IS SCHEDULED AT [facility] [address]  ON:  Tuesday  4/28  REPORT TO  SHORT STAY CENTER AT:  5:30 AM      PHONE # FOR SHORT STAY IS [phone]  DO NOT EAT OR DRINK ANYTHING AFTER MIDNIGHT THE NIGHT BEFORE YOUR SURGERY.  YOU MAY BRUSH YOUR TEETH, RINSE OUT YOUR MOUTH--BUT NO WATER, NO FOOD, NO CHEWING GUM, NO MINTS, NO CANDIES, NO CHEWING TOBACCO.  PLEASE TAKE THE FOLLOWING MEDICATIONS THE AM OF YOUR SURGERY WITH A FEW SIPS OF WATER:  LEVOTHYROXINE  DO NOT BRING VALUABLES, MONEY, CREDIT CARDS.  DO NOT WEAR JEWELRY, MAKE-UP, NAIL POLISH AND NO METAL PINS OR CLIPS IN YOUR HAIR. CONTACT LENS, DENTURES / PARTIALS, GLASSES SHOULD NOT BE WORN TO SURGERY AND IN MOST CASES-HEARING AIDS WILL NEED TO BE REMOVED.  BRING YOUR GLASSES CASE, ANY EQUIPMENT NEEDED FOR YOUR CONTACT LENS. FOR PATIENTS ADMITTED TO THE HOSPITAL--CHECK OUT TIME THE DAY OF DISCHARGE IS 11:00 AM.  ALL INPATIENT ROOMS ARE PRIVATE - WITH BATHROOM, TELEPHONE, TELEVISION AND WIFI INTERNET.  IF YOU ARE BEING DISCHARGED THE SAME DAY OF YOUR SURGERY--YOU CAN NOT DRIVE YOURSELF HOME--AND SHOULD NOT GO HOME ALONE BY TAXI OR BUS.  NO DRIVING OR OPERATING MACHINERY, OR MAKING LEGAL DECISIONS FOR 24 HOURS FOLLOWING ANESTHESIA / PAIN MEDICATIONS.  PLEASE MAKE ARRANGEMENTS FOR SOMEONE TO BE WITH YOU AT HOME THE FIRST 24 HOURS AFTER SURGERY. RESPONSIBLE DRIVER'S NAME / PHONE                                                     FAILURE TO FOLLOW THESE INSTRUCTIONS MAY RESULT IN THE CANCELLATION OF YOUR SURGERY. PLEASE BE AWARE THAT YOU MAY NEED ADDITIONAL BLOOD DRAWN DAY OF YOUR SURGERY  PATIENT SIGNATURE_________________________________

## 2014-03-11 NOTE — Progress Notes (Signed)
Quick Note:  Pre-operative chest x-ray is acceptable for scheduled surgery.  Loyola Santino M. Ruwayda Curet, MD, FACS [facility] Office: [phone]   ______ 

## 2014-03-11 NOTE — Progress Notes (Signed)
Quick Note:  EKG is acceptable for scheduled surgery.  Raven Dedmon M. Breaunna Gottlieb, MD, FACS [facility] Office: [phone]   ______ 

## 2014-03-11 NOTE — Progress Notes (Signed)
Quick Note:  These results are acceptable for scheduled surgery.  Vertie Dibbern M. Veralyn Lopp, MD, FACS [facility] Office: [phone]   ______ 

## 2014-03-11 NOTE — Pre-Procedure Instructions (Signed)
EKG AND CXR WERE DONE TODAY - PREOP AT [facility]. 

## 2014-03-17 ENCOUNTER — Ambulatory Visit: Payer: Medicare Other | Admitting: Internal Medicine

## 2014-03-22 ENCOUNTER — Ambulatory Visit: Payer: Medicare Other | Admitting: Internal Medicine

## 2014-03-22 NOTE — Anesthesia Preprocedure Evaluation (Addendum)
Anesthesia Evaluation  Patient identified by MRN, date of birth, ID band Patient awake    Reviewed: Allergy & Precautions, H&P , NPO status , Patient's Chart, lab work & pertinent test results  Airway Mallampati: II TM Distance: >3 FB Neck ROM: Full    Dental no notable dental hx.    Pulmonary neg pulmonary ROS,  breath sounds clear to auscultation  Pulmonary exam normal       Cardiovascular Rhythm:Regular Rate:Normal     Neuro/Psych negative neurological ROS  negative psych ROS   GI/Hepatic negative GI ROS, Neg liver ROS,   Endo/Other  Hypothyroidism   Renal/GU negative Renal ROS  negative genitourinary   Musculoskeletal negative musculoskeletal ROS (+)   Abdominal   Peds negative pediatric ROS (+)  Hematology negative hematology ROS (+)   Anesthesia Other Findings   Reproductive/Obstetrics negative OB ROS                          Anesthesia Physical Anesthesia Plan  ASA: II  Anesthesia Plan: General   Post-op Pain Management:    Induction: Intravenous  Airway Management Planned: Oral ETT  Additional Equipment:   Intra-op Plan:   Post-operative Plan: Extubation in OR  Informed Consent: I have reviewed the patients History and Physical, chart, labs and discussed the procedure including the risks, benefits and alternatives for the proposed anesthesia with the patient or authorized representative who has indicated his/her understanding and acceptance.   Dental advisory given  Plan Discussed with: CRNA and Surgeon  Anesthesia Plan Comments:         Anesthesia Quick Evaluation

## 2014-03-23 ENCOUNTER — Encounter (HOSPITAL_COMMUNITY): Payer: Medicare Other | Admitting: Anesthesiology

## 2014-03-23 ENCOUNTER — Encounter (HOSPITAL_COMMUNITY): Payer: Self-pay | Admitting: *Deleted

## 2014-03-23 ENCOUNTER — Other Ambulatory Visit (INDEPENDENT_AMBULATORY_CARE_PROVIDER_SITE_OTHER): Payer: Self-pay

## 2014-03-23 ENCOUNTER — Telehealth (INDEPENDENT_AMBULATORY_CARE_PROVIDER_SITE_OTHER): Payer: Self-pay

## 2014-03-23 ENCOUNTER — Ambulatory Visit (HOSPITAL_COMMUNITY): Payer: Medicare Other | Admitting: Anesthesiology

## 2014-03-23 ENCOUNTER — Encounter (HOSPITAL_COMMUNITY)
Admission: RE | Disposition: A | Discharge status: Home / Self Care | Payer: Self-pay | Source: Ambulatory Visit | Attending: Surgery

## 2014-03-23 ENCOUNTER — Ambulatory Visit: Payer: Medicare Other | Admitting: Internal Medicine

## 2014-03-23 ENCOUNTER — Ambulatory Visit (HOSPITAL_COMMUNITY)
Admission: RE | Admit: 2014-03-23 | Discharge: 2014-03-23 | Disposition: A | Discharge status: Home / Self Care | Payer: Medicare Other | Source: Ambulatory Visit | Attending: Surgery | Admitting: Surgery

## 2014-03-23 DIAGNOSIS — E892 Postprocedural hypoparathyroidism: Secondary | ICD-10-CM | POA: Diagnosis present

## 2014-03-23 DIAGNOSIS — E349 Endocrine disorder, unspecified: Secondary | ICD-10-CM | POA: Diagnosis present

## 2014-03-23 DIAGNOSIS — M810 Age-related osteoporosis without current pathological fracture: Secondary | ICD-10-CM | POA: Insufficient documentation

## 2014-03-23 DIAGNOSIS — D34 Benign neoplasm of thyroid gland: Secondary | ICD-10-CM | POA: Diagnosis not present

## 2014-03-23 DIAGNOSIS — E210 Primary hyperparathyroidism: Secondary | ICD-10-CM | POA: Diagnosis not present

## 2014-03-23 DIAGNOSIS — Z79899 Other long term (current) drug therapy: Secondary | ICD-10-CM | POA: Insufficient documentation

## 2014-03-23 DIAGNOSIS — Z9889 Other specified postprocedural states: Secondary | ICD-10-CM | POA: Diagnosis present

## 2014-03-23 DIAGNOSIS — R7989 Other specified abnormal findings of blood chemistry: Secondary | ICD-10-CM | POA: Diagnosis present

## 2014-03-23 DIAGNOSIS — E785 Hyperlipidemia, unspecified: Secondary | ICD-10-CM | POA: Diagnosis not present

## 2014-03-23 DIAGNOSIS — E039 Hypothyroidism, unspecified: Secondary | ICD-10-CM | POA: Insufficient documentation

## 2014-03-23 DIAGNOSIS — D351 Benign neoplasm of parathyroid gland: Secondary | ICD-10-CM | POA: Diagnosis not present

## 2014-03-23 HISTORY — PX: PARATHYROIDECTOMY: SHX19

## 2014-03-23 SURGERY — PARATHYROIDECTOMY
Anesthesia: General | Site: Neck | Laterality: Left

## 2014-03-23 MED ORDER — NEOSTIGMINE METHYLSULFATE 1 MG/ML IJ SOLN
INTRAMUSCULAR | Status: DC | PRN
  Administered 2014-03-23: 3 mg via INTRAVENOUS

## 2014-03-23 MED ORDER — LIDOCAINE HCL (CARDIAC) 20 MG/ML IV SOLN
INTRAVENOUS | Status: DC | PRN
  Administered 2014-03-23: 25 mg via INTRAVENOUS

## 2014-03-23 MED ORDER — LIDOCAINE HCL (CARDIAC) 20 MG/ML IV SOLN
INTRAVENOUS | Status: AC
  Filled 2014-03-23: qty 5

## 2014-03-23 MED ORDER — EPHEDRINE SULFATE 50 MG/ML IJ SOLN
INTRAMUSCULAR | Status: DC | PRN
  Administered 2014-03-23 (×2): 10 mg via INTRAVENOUS

## 2014-03-23 MED ORDER — LACTATED RINGERS IV SOLN
INTRAVENOUS | Status: DC | PRN
  Administered 2014-03-23: 07:00:00 via INTRAVENOUS
  Administered 2014-03-23: 1000 mL

## 2014-03-23 MED ORDER — EPHEDRINE SULFATE 50 MG/ML IJ SOLN
INTRAMUSCULAR | Status: AC
  Filled 2014-03-23: qty 1

## 2014-03-23 MED ORDER — HYDROMORPHONE HCL PF 1 MG/ML IJ SOLN: 0.2500 mg | INTRAMUSCULAR | Status: DC | PRN

## 2014-03-23 MED ORDER — DEXAMETHASONE SODIUM PHOSPHATE 10 MG/ML IJ SOLN
INTRAMUSCULAR | Status: DC | PRN
  Administered 2014-03-23: 6 mg via INTRAVENOUS

## 2014-03-23 MED ORDER — DEXAMETHASONE SODIUM PHOSPHATE 10 MG/ML IJ SOLN
INTRAMUSCULAR | Status: AC
  Filled 2014-03-23: qty 1

## 2014-03-23 MED ORDER — HYDROCODONE-ACETAMINOPHEN 5-325 MG PO TABS
1.0000 | ORAL_TABLET | ORAL | Status: DC | PRN
  Administered 2014-03-23: 1 via ORAL
  Filled 2014-03-23: qty 1

## 2014-03-23 MED ORDER — LIDOCAINE HCL 4 % MT SOLN
OROMUCOSAL | Status: DC | PRN
  Administered 2014-03-23: 4 mL via TOPICAL

## 2014-03-23 MED ORDER — CISATRACURIUM BESYLATE (PF) 10 MG/5ML IV SOLN
INTRAVENOUS | Status: DC | PRN
  Administered 2014-03-23: 4 mg via INTRAVENOUS

## 2014-03-23 MED ORDER — CEFAZOLIN SODIUM-DEXTROSE 2-3 GM-% IV SOLR
2.0000 g | INTRAVENOUS | Status: AC
  Administered 2014-03-23: 2 g via INTRAVENOUS

## 2014-03-23 MED ORDER — 0.9 % SODIUM CHLORIDE (POUR BTL) OPTIME
TOPICAL | Status: DC | PRN
  Administered 2014-03-23: 1000 mL

## 2014-03-23 MED ORDER — BUPIVACAINE-EPINEPHRINE 0.25% -1:200000 IJ SOLN
INTRAMUSCULAR | Status: DC | PRN
  Administered 2014-03-23: 10 mL

## 2014-03-23 MED ORDER — PROPOFOL 10 MG/ML IV BOLUS
INTRAVENOUS | Status: DC | PRN
  Administered 2014-03-23: 110 mg via INTRAVENOUS

## 2014-03-23 MED ORDER — BUPIVACAINE-EPINEPHRINE (PF) 0.25% -1:200000 IJ SOLN
INTRAMUSCULAR | Status: AC
  Filled 2014-03-23: qty 30

## 2014-03-23 MED ORDER — PROMETHAZINE HCL 25 MG/ML IJ SOLN: 6.2500 mg | INTRAMUSCULAR | Status: DC | PRN

## 2014-03-23 MED ORDER — HYDROCODONE-ACETAMINOPHEN 5-325 MG PO TABS: 1.0000 | ORAL_TABLET | ORAL | Status: DC | PRN

## 2014-03-23 MED ORDER — SUCCINYLCHOLINE CHLORIDE 20 MG/ML IJ SOLN
INTRAMUSCULAR | Status: DC | PRN
Start: 2014-03-23 — End: 2014-03-23
  Administered 2014-03-23: 60 mg via INTRAVENOUS

## 2014-03-23 MED ORDER — ONDANSETRON HCL 4 MG/2ML IJ SOLN
INTRAMUSCULAR | Status: AC
  Filled 2014-03-23: qty 2

## 2014-03-23 MED ORDER — ONDANSETRON HCL 4 MG/2ML IJ SOLN
INTRAMUSCULAR | Status: DC | PRN
  Administered 2014-03-23: 4 mg via INTRAVENOUS

## 2014-03-23 MED ORDER — FENTANYL CITRATE 0.05 MG/ML IJ SOLN
INTRAMUSCULAR | Status: AC
  Filled 2014-03-23: qty 5

## 2014-03-23 MED ORDER — GLYCOPYRROLATE 0.2 MG/ML IJ SOLN
INTRAMUSCULAR | Status: AC
  Filled 2014-03-23: qty 2

## 2014-03-23 MED ORDER — CISATRACURIUM BESYLATE 20 MG/10ML IV SOLN
INTRAVENOUS | Status: AC
  Filled 2014-03-23: qty 10

## 2014-03-23 MED ORDER — GLYCOPYRROLATE 0.2 MG/ML IJ SOLN
INTRAMUSCULAR | Status: DC | PRN
  Administered 2014-03-23: .4 mg via INTRAVENOUS

## 2014-03-23 MED ORDER — KETOROLAC TROMETHAMINE 30 MG/ML IJ SOLN: 15.0000 mg | Freq: Once | INTRAMUSCULAR | Status: DC | PRN

## 2014-03-23 MED ORDER — FENTANYL CITRATE 0.05 MG/ML IJ SOLN
INTRAMUSCULAR | Status: DC | PRN
  Administered 2014-03-23 (×2): 50 ug via INTRAVENOUS
  Administered 2014-03-23: 100 ug via INTRAVENOUS

## 2014-03-23 MED ORDER — PROPOFOL 10 MG/ML IV BOLUS
INTRAVENOUS | Status: AC
  Filled 2014-03-23: qty 20

## 2014-03-23 MED ORDER — SODIUM CHLORIDE 0.9 % IJ SOLN
INTRAMUSCULAR | Status: AC
  Filled 2014-03-23: qty 10

## 2014-03-23 MED ORDER — CEFAZOLIN SODIUM-DEXTROSE 2-3 GM-% IV SOLR
INTRAVENOUS | Status: AC
  Filled 2014-03-23: qty 50

## 2014-03-23 SURGICAL SUPPLY — 40 items
ATTRACTOMAT 16X20 MAGNETIC DRP (DRAPES) ×2 IMPLANT
BENZOIN TINCTURE PRP APPL 2/3 (GAUZE/BANDAGES/DRESSINGS) ×2 IMPLANT
BLADE HEX COATED 2.75 (ELECTRODE) ×2 IMPLANT
BLADE SURG 15 STRL LF DISP TIS (BLADE) ×1 IMPLANT
BLADE SURG 15 STRL SS (BLADE) ×1
CANISTER SUCTION 2500CC (MISCELLANEOUS) IMPLANT
CHLORAPREP W/TINT 10.5 ML (MISCELLANEOUS) ×2 IMPLANT
CLIP TI MEDIUM 6 (CLIP) ×2 IMPLANT
CLIP TI WIDE RED SMALL 6 (CLIP) ×2 IMPLANT
DRAPE PED LAPAROTOMY (DRAPES) ×2 IMPLANT
DRESSING SURGICEL FIBRLLR 1X2 (HEMOSTASIS) ×1 IMPLANT
DRSG SURGICEL FIBRILLAR 1X2 (HEMOSTASIS) ×2
ELECT REM PT RETURN 9FT ADLT (ELECTROSURGICAL) ×2
ELECTRODE REM PT RTRN 9FT ADLT (ELECTROSURGICAL) ×1 IMPLANT
GAUZE SPONGE 4X4 16PLY XRAY LF (GAUZE/BANDAGES/DRESSINGS) ×2 IMPLANT
GLOVE BIOGEL PI IND STRL 7.5 (GLOVE) ×1 IMPLANT
GLOVE BIOGEL PI INDICATOR 7.5 (GLOVE) ×1
GLOVE SS BIOGEL STRL SZ 7.5 (GLOVE) ×1 IMPLANT
GLOVE SUPERSENSE BIOGEL SZ 7.5 (GLOVE) ×1
GLOVE SURG ORTHO 8.0 STRL STRW (GLOVE) ×2 IMPLANT
GLOVE SURG SS PI 6.5 STRL IVOR (GLOVE) ×2 IMPLANT
GOWN STRL REUS W/TWL XL LVL3 (GOWN DISPOSABLE) ×4 IMPLANT
KIT BASIN OR (CUSTOM PROCEDURE TRAY) ×2 IMPLANT
NEEDLE HYPO 25X1 1.5 SAFETY (NEEDLE) ×2 IMPLANT
NS IRRIG 1000ML POUR BTL (IV SOLUTION) ×2 IMPLANT
PACK BASIC VI WITH GOWN DISP (CUSTOM PROCEDURE TRAY) ×2 IMPLANT
PENCIL BUTTON HOLSTER BLD 10FT (ELECTRODE) ×2 IMPLANT
STAPLER VISISTAT 35W (STAPLE) IMPLANT
STRIP CLOSURE SKIN 1/2X4 (GAUZE/BANDAGES/DRESSINGS) ×2 IMPLANT
SUT MNCRL AB 4-0 PS2 18 (SUTURE) ×2 IMPLANT
SUT SILK 2 0 (SUTURE)
SUT SILK 2-0 18XBRD TIE 12 (SUTURE) IMPLANT
SUT SILK 3 0 (SUTURE)
SUT SILK 3-0 18XBRD TIE 12 (SUTURE) IMPLANT
SUT VIC AB 3-0 SH 18 (SUTURE) ×2 IMPLANT
SYR BULB IRRIGATION 50ML (SYRINGE) ×2 IMPLANT
SYR CONTROL 10ML LL (SYRINGE) ×2 IMPLANT
TOWEL OR 17X26 10 PK STRL BLUE (TOWEL DISPOSABLE) ×2 IMPLANT
TOWEL OR NON WOVEN STRL DISP B (DISPOSABLE) ×2 IMPLANT
YANKAUER SUCT BULB TIP 10FT TU (MISCELLANEOUS) ×2 IMPLANT
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
## 2014-03-23 NOTE — Telephone Encounter (Signed)
LMOM for pt to call. Pt needs to be advised of po appt with Dr Harlow Asa for 04-20-14 arrive at 10:00am. PO appt will be 4 wk po due to Dr Harlow Asa being out of the office. I have mailed lab slip to pt to have labs on 04-13-14.

## 2014-03-23 NOTE — Telephone Encounter (Signed)
Pt called back and was given below appt info. She will be out of town through 5/26. She is asking if she can be seen on 5/27. Let pt know I would send request to his assistant and she will call her back.

## 2014-03-23 NOTE — Op Note (Signed)
OPERATIVE REPORT - PARATHYROIDECTOMY  Preoperative diagnosis: Primary hyperparathyroidism  Postop diagnosis: Same  Procedure: Left inferior minimally invasive parathyroidectomy  Surgeon:  Earnstine Regal, MD, FACS  Anesthesia: Gen. endotracheal  Estimated blood loss: Minimal  Preparation: ChloraPrep  Indications: Patient is a 73 year old female referred by her endocrinologist for evaluation of hypercalcemia and elevated intact parathyroid hormone levels. She does have a history of osteoporosis. She has had no prior head or neck surgery. Laboratory studies have shown calcium levels ranging from 10.2-11.0. Intact PTH levels from separate laboratories ranged from 58-144. Vitamin D levels are normal. Patient has a long-standing history of hypothyroidism and takes levothyroxine 75 mcg daily. There is a history of hypothyroidism in the patient's mother. There is no family history of parathyroid disease. There is no family history of other endocrine neoplasm.  Patient has had no prior head or neck surgery. Nuclear med scan localizes to the left inferior position. 24 hour calcium level is markedly elevated consistent with primary hyperparathyroidism.  Procedure: Patient was prepared in the holding area. He was brought to operating room and placed in a supine position on the operating room table. Following administration of general anesthesia, the patient was positioned and then prepped and draped in the usual strict aseptic fashion. After ascertaining that an adequate level of anesthesia been achieved, a neck incision was made with a #15 blade. Dissection was carried through subcutaneous tissues and platysma. Hemostasis was obtained with the electrocautery. Skin flaps were developed circumferentially and a Weitlander retractor was placed for exposure.  Strap muscles were incised in the midline. Strap muscles were reflected exposing the thyroid lobe. With gentle blunt dissection the thyroid lobe was  mobilized.  Dissection was carried through adipose tissue and an enlarged parathyroid gland was identified. It was gently mobilized. Vascular structures were divided between small and medium ligaclips. Care was taken to avoid the recurrent laryngeal nerve and the esophagus. The parathyroid gland was completely excised. It was submitted to pathology where frozen section confirmed parathyroid tissue consistent with adenoma.  Neck was irrigated with warm saline and good hemostasis was noted. Fibrillar was placed in the operative field. Strap muscles were reapproximated in the midline with interrupted 3-0 Vicryl sutures. Platysma was closed with interrupted 3-0 Vicryl sutures. Skin was closed with a running 4-0 Monocryl subcuticular suture. Marcaine was infiltrated circumferentially. Wound was washed and dried and benzoin and Steri-Strips were applied. Sterile gauze dressings were applied. Patient was awakened from anesthesia and brought to the recovery room. The patient tolerated the procedure well.   Earnstine Regal, MD, [facility]

## 2014-03-23 NOTE — Anesthesia Postprocedure Evaluation (Signed)
  Anesthesia Post-op Note  Patient: Raven [name]  Procedure(s) Performed: Procedure(s) (LRB): LEFT INFERIOR PARATHYROIDECTOMY (Left)  Patient Location: PACU  Anesthesia Type: General  Level of Consciousness: awake and alert   Airway and Oxygen Therapy: Patient Spontanous Breathing  Post-op Pain: mild  Post-op Assessment: Post-op Vital signs reviewed, Patient's Cardiovascular Status Stable, Respiratory Function Stable, Patent Airway and No signs of Nausea or vomiting  Last Vitals:  Filed Vitals:   03/23/14 0915  BP: 123/60  Pulse: 57  Temp: 36.6 C  Resp: 16    Post-op Vital Signs: stable   Complications: No apparent anesthesia complications

## 2014-03-23 NOTE — Transfer of Care (Signed)
Immediate Anesthesia Transfer of Care Note  Patient: Raven [name]  Procedure(s) Performed: Procedure(s): LEFT INFERIOR PARATHYROIDECTOMY (Left)  Patient Location: PACU  Anesthesia Type:General  Level of Consciousness: awake, alert  and oriented  Airway & Oxygen Therapy: Patient Spontanous Breathing and Patient connected to face mask oxygen  Post-op Assessment: Report given to PACU RN and Post -op Vital signs reviewed and stable  Post vital signs: Reviewed and stable  Complications: No apparent anesthesia complications

## 2014-03-23 NOTE — Anesthesia Procedure Notes (Signed)
Procedure Name: Intubation Date/Time: 03/23/2014 7:44 AM Performed by: Danley Danker L Patient Re-evaluated:Patient Re-evaluated prior to inductionOxygen Delivery Method: Circle system utilized Preoxygenation: Pre-oxygenation with 100% oxygen Intubation Type: IV induction Ventilation: Mask ventilation without difficulty Laryngoscope Size: Miller and 2 Grade View: Grade I Tube type: Reinforced Tube size: 7.0 mm Number of attempts: 1 Airway Equipment and Method: Stylet and LTA kit utilized Placement Confirmation: ETT inserted through vocal cords under direct vision,  positive ETCO2 and breath sounds checked- equal and bilateral Secured at: 20 cm Tube secured with: Tape Dental Injury: Teeth and Oropharynx as per pre-operative assessment

## 2014-03-24 ENCOUNTER — Encounter (HOSPITAL_COMMUNITY): Payer: Self-pay | Admitting: Surgery

## 2014-03-28 ENCOUNTER — Encounter (INDEPENDENT_AMBULATORY_CARE_PROVIDER_SITE_OTHER): Payer: Self-pay | Admitting: Surgery

## 2014-04-09 ENCOUNTER — Ambulatory Visit (INDEPENDENT_AMBULATORY_CARE_PROVIDER_SITE_OTHER): Payer: Medicare Other | Admitting: Internal Medicine

## 2014-04-09 ENCOUNTER — Encounter: Payer: Self-pay | Admitting: Internal Medicine

## 2014-04-09 VITALS — BP 140/76 | HR 60 | Temp 98.0°F | Resp 16 | Wt 126.2 lb

## 2014-04-09 DIAGNOSIS — Z1239 Encounter for other screening for malignant neoplasm of breast: Secondary | ICD-10-CM | POA: Diagnosis not present

## 2014-04-09 DIAGNOSIS — M8580 Other specified disorders of bone density and structure, unspecified site: Secondary | ICD-10-CM

## 2014-04-09 DIAGNOSIS — M899 Disorder of bone, unspecified: Secondary | ICD-10-CM

## 2014-04-09 DIAGNOSIS — M949 Disorder of cartilage, unspecified: Secondary | ICD-10-CM

## 2014-04-09 DIAGNOSIS — G5600 Carpal tunnel syndrome, unspecified upper limb: Secondary | ICD-10-CM

## 2014-04-09 DIAGNOSIS — E210 Primary hyperparathyroidism: Secondary | ICD-10-CM

## 2014-04-09 DIAGNOSIS — G5602 Carpal tunnel syndrome, left upper limb: Secondary | ICD-10-CM

## 2014-04-09 DIAGNOSIS — E785 Hyperlipidemia, unspecified: Secondary | ICD-10-CM
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
## 2014-04-09 NOTE — Patient Instructions (Addendum)
There is some controversy regarding the effects of calcium supplements on coronary scores.  I am recommending that dietary calcium be the main source of calcium for that reason, rather than medications.   Try [employer] Diamond almond milk/coconut milk   Unsweetened   Variety > 400 mg calcium /serving 45 calories   Mammogram and Bone Density Test are to be ordered.   Fasting labs to be done next week with your calcium recheck.   Retrurn  in 6 months for your annual exam

## 2014-04-09 NOTE — Progress Notes (Signed)
Pre-visit discussion using our clinic review tool. No additional management support is needed unless otherwise documented below in the visit note.  

## 2014-04-11 DIAGNOSIS — Z9889 Other specified postprocedural states: Secondary | ICD-10-CM | POA: Insufficient documentation

## 2014-04-11 NOTE — Assessment & Plan Note (Signed)
Referral to ortho for nonsurgical evaluation and management including splinting of left wrist

## 2014-04-11 NOTE — Assessment & Plan Note (Signed)
Secondary to hyperparathyrodism.  S/p parathyroidectomy.

## 2014-04-11 NOTE — Assessment & Plan Note (Signed)
S/p parathyroidectomy by todd Gherkin in late April. Benign path report.  Repeat calcium due end of month   Lab Results  Component Value Date   CALCIUM 11.0* 03/11/2014   PHOS 2.8 12/16/2013

## 2014-04-11 NOTE — Assessment & Plan Note (Signed)
Currently on statin therapy according to [facility] guidelines.  Liver enzymes are normal , no changes today.  Lab Results  Component Value Date   CHOL 250* 10/14/2013   HDL 73.70 10/14/2013   LDLCALC 113* 03/07/2012   LDLDIRECT 163.0 10/14/2013   TRIG 46.0 10/14/2013   CHOLHDL 3 10/14/2013   Lab Results  Component Value Date   ALT 21 09/01/2013   AST 31 09/01/2013   ALKPHOS 50 09/01/2013   BILITOT 0.5 09/01/2013

## 2014-04-11 NOTE — Assessment & Plan Note (Signed)
Discussed the current controversies surrounding the risks and benefits of calcium supplementation.  Encouraged her to increase dietary calcium through natural foods including almond/coconut milk 

## 2014-04-13 ENCOUNTER — Other Ambulatory Visit: Payer: Self-pay | Admitting: Internal Medicine

## 2014-04-13 DIAGNOSIS — E785 Hyperlipidemia, unspecified: Secondary | ICD-10-CM | POA: Diagnosis not present

## 2014-04-13 DIAGNOSIS — E559 Vitamin D deficiency, unspecified: Secondary | ICD-10-CM | POA: Diagnosis not present

## 2014-04-13 DIAGNOSIS — E039 Hypothyroidism, unspecified: Secondary | ICD-10-CM | POA: Diagnosis not present

## 2014-04-13 DIAGNOSIS — Z79899 Other long term (current) drug therapy: Secondary | ICD-10-CM | POA: Diagnosis not present

## 2014-04-13 LAB — CBC WITH DIFFERENTIAL/PLATELET
Basophils Absolute: 0 10*3/uL (ref 0.0–0.1)
Basophils Relative: 1 % (ref 0–1)
Eosinophils Absolute: 0.1 10*3/uL (ref 0.0–0.7)
Eosinophils Relative: 2 % (ref 0–5)
HCT: 36.5 % (ref 36.0–46.0)
Hemoglobin: 12.5 g/dL (ref 12.0–15.0)
Lymphocytes Relative: 38 % (ref 12–46)
Lymphs Abs: 1.7 10*3/uL (ref 0.7–4.0)
MCH: 30.9 pg (ref 26.0–34.0)
MCHC: 34.2 g/dL (ref 30.0–36.0)
MCV: 90.1 fL (ref 78.0–100.0)
Monocytes Absolute: 0.4 10*3/uL (ref 0.1–1.0)
Monocytes Relative: 9 % (ref 3–12)
Neutro Abs: 2.3 10*3/uL (ref 1.7–7.7)
Neutrophils Relative %: 50 % (ref 43–77)
Platelets: 269 10*3/uL (ref 150–400)
RBC: 4.05 MIL/uL (ref 3.87–5.11)
RDW: 13.6 % (ref 11.5–15.5)
WBC: 4.5 10*3/uL (ref 4.0–10.5)

## 2014-04-13 LAB — COMPREHENSIVE METABOLIC PANEL
ALT: 19 U/L (ref 0–35)
AST: 24 U/L (ref 0–37)
Albumin: 4.2 g/dL (ref 3.5–5.2)
Alkaline Phosphatase: 56 U/L (ref 39–117)
BUN: 16 mg/dL (ref 6–23)
CO2: 29 mEq/L (ref 19–32)
Calcium: 9.8 mg/dL (ref 8.4–10.5)
Chloride: 106 mEq/L (ref 96–112)
Creat: 0.77 mg/dL (ref 0.50–1.10)
Glucose, Bld: 89 mg/dL (ref 70–99)
Potassium: 4.5 mEq/L (ref 3.5–5.3)
Sodium: 141 mEq/L (ref 135–145)
Total Bilirubin: 0.5 mg/dL (ref 0.2–1.2)
Total Protein: 6.3 g/dL (ref 6.0–8.3)

## 2014-04-13 LAB — TSH: TSH: 1.217 u[IU]/mL (ref 0.350–4.500)

## 2014-04-13 LAB — LIPID PANEL
Cholesterol: 188 mg/dL (ref 0–200)
HDL: 77 mg/dL (ref >39)
LDL Cholesterol: 97 mg/dL (ref 0–99)
Total CHOL/HDL Ratio: 2.4 Ratio
Triglycerides: 70 mg/dL (ref <150)
VLDL: 14 mg/dL (ref 0–40)

## 2014-04-13 LAB — VITAMIN D 25 HYDROXY (VIT D DEFICIENCY, FRACTURES): Vit D, 25-Hydroxy: 50 ng/mL (ref 30–89)

## 2014-04-13 LAB — CALCIUM: Calcium: 9.7 mg/dL (ref 8.4–10.5)

## 2014-04-14 ENCOUNTER — Encounter: Payer: Self-pay | Admitting: Internal Medicine

## 2014-04-20 ENCOUNTER — Encounter (INDEPENDENT_AMBULATORY_CARE_PROVIDER_SITE_OTHER): Payer: Federal, State, Local not specified - PPO | Admitting: Surgery

## 2014-04-21 ENCOUNTER — Ambulatory Visit (INDEPENDENT_AMBULATORY_CARE_PROVIDER_SITE_OTHER): Payer: Medicare Other | Admitting: Surgery

## 2014-04-21 ENCOUNTER — Encounter (INDEPENDENT_AMBULATORY_CARE_PROVIDER_SITE_OTHER): Payer: Self-pay | Admitting: Surgery

## 2014-04-21 ENCOUNTER — Other Ambulatory Visit (INDEPENDENT_AMBULATORY_CARE_PROVIDER_SITE_OTHER): Payer: Self-pay

## 2014-04-21 VITALS — BP 118/75 | HR 68 | Temp 98.3°F | Resp 12 | Ht 63.0 in | Wt 127.2 lb

## 2014-04-21 DIAGNOSIS — E210 Primary hyperparathyroidism: Secondary | ICD-10-CM

## 2014-04-21 DIAGNOSIS — E213 Hyperparathyroidism, unspecified: Secondary | ICD-10-CM

## 2014-04-21 NOTE — Progress Notes (Signed)
General Surgery [facility]  Chief Complaint  Patient presents with  . Routine Post Op    left inferior parathyroidectomy 03/23/2014    HISTORY: Patient is a 73 year old female who underwent minimally invasive parathyroidectomy on 03/23/2014. Final pathology showed a parathyroid adenoma measuring 2.0 cm in length. Postoperative calcium level was normal at 9.7.  EXAM: Mild soft tissue swelling. No sign of infection. Voice quality is normal.  IMPRESSION: Status post minimally invasive parathyroidectomy  PLAN: Patient will begin applying topical creams to her incision. She will return in 6 weeks for wound check. We will check an intact PTH level and serum calcium level prior to that office visit.  Earnstine Regal, MD, [facility]   Visit Diagnoses: 1. Hyperparathyroidism, primary

## 2014-04-21 NOTE — Patient Instructions (Signed)
  CARE OF INCISION   Apply cocoa butter/vitamin E cream (Palmer's brand) to your incision 2 - 3 times daily.  Massage cream into incision for one minute with each application.  Use sunscreen (50 SPF or higher) for first 6 months after surgery if area is exposed to sun.  You may alternate Mederma or other scar reducing cream with cocoa butter cream if desired.       Toma Erichsen M. Lashonda Sonneborn, MD, FACS      [facility]      Office: [phone]    

## 2014-04-22 NOTE — Telephone Encounter (Signed)
Mailed unread message to pt  

## 2014-04-23 ENCOUNTER — Encounter: Payer: Self-pay | Admitting: Internal Medicine

## 2014-04-23 DIAGNOSIS — G5600 Carpal tunnel syndrome, unspecified upper limb: Secondary | ICD-10-CM

## 2014-04-30 ENCOUNTER — Telehealth: Payer: Self-pay | Admitting: Internal Medicine

## 2014-04-30 NOTE — Telephone Encounter (Signed)
Can you givem them a verbal until Monday?

## 2014-04-30 NOTE — Telephone Encounter (Signed)
Patient is fine with Monday appointment is not til Thursday.

## 2014-04-30 NOTE — Telephone Encounter (Signed)
The patient stated that a form was faxed over from [facility] needing a signature to start therapy.  Fax # [phone]

## 2014-04-30 NOTE — Telephone Encounter (Signed)
In red folder.  Patient called at 10.25 and paper work did not arrive until 11.06 advised patient MD out of office til Monday.

## 2014-05-03 ENCOUNTER — Encounter: Payer: Self-pay | Admitting: Internal Medicine

## 2014-05-03 MED ORDER — SIMVASTATIN 20 MG PO TABS: ORAL_TABLET | ORAL | Status: DC

## 2014-05-06 ENCOUNTER — Encounter: Payer: Self-pay | Admitting: Internal Medicine

## 2014-05-06 DIAGNOSIS — Z993 Dependence on wheelchair: Secondary | ICD-10-CM | POA: Diagnosis not present

## 2014-05-06 DIAGNOSIS — G5600 Carpal tunnel syndrome, unspecified upper limb: Secondary | ICD-10-CM | POA: Diagnosis not present

## 2014-05-06 DIAGNOSIS — IMO0001 Reserved for inherently not codable concepts without codable children: Secondary | ICD-10-CM | POA: Diagnosis not present

## 2014-05-13 DIAGNOSIS — G5600 Carpal tunnel syndrome, unspecified upper limb: Secondary | ICD-10-CM | POA: Diagnosis not present

## 2014-05-13 DIAGNOSIS — Z993 Dependence on wheelchair: Secondary | ICD-10-CM | POA: Diagnosis not present

## 2014-05-13 DIAGNOSIS — IMO0001 Reserved for inherently not codable concepts without codable children: Secondary | ICD-10-CM | POA: Diagnosis not present

## 2014-05-25 ENCOUNTER — Ambulatory Visit: Payer: Federal, State, Local not specified - PPO

## 2014-05-26 ENCOUNTER — Encounter: Payer: Self-pay | Admitting: Internal Medicine

## 2014-06-01 ENCOUNTER — Ambulatory Visit: Payer: Medicare Other | Admitting: *Deleted

## 2014-06-01 DIAGNOSIS — Z23 Encounter for immunization: Secondary | ICD-10-CM

## 2014-06-01 MED ORDER — TETANUS-DIPHTH-ACELL PERTUSSIS 5-2.5-18.5 LF-MCG/0.5 IM SUSP
0.5000 mL | Freq: Once | INTRAMUSCULAR | Status: AC
  Administered 2014-06-01: 0.5 mL via INTRAMUSCULAR

## 2014-06-02 DIAGNOSIS — E213 Hyperparathyroidism, unspecified: Secondary | ICD-10-CM | POA: Diagnosis not present

## 2014-06-03 LAB — PTH, INTACT AND CALCIUM
Calcium: 9.6 mg/dL (ref 8.4–10.5)
PTH: 63.2 pg/mL (ref 14.0–72.0)

## 2014-06-10 ENCOUNTER — Ambulatory Visit (INDEPENDENT_AMBULATORY_CARE_PROVIDER_SITE_OTHER): Payer: Medicare Other | Admitting: Surgery

## 2014-06-10 ENCOUNTER — Encounter (INDEPENDENT_AMBULATORY_CARE_PROVIDER_SITE_OTHER): Payer: Self-pay | Admitting: Surgery

## 2014-06-10 VITALS — BP 120/68 | HR 72 | Temp 98.3°F | Resp 18 | Ht 63.0 in | Wt 125.0 lb

## 2014-06-10 DIAGNOSIS — E210 Primary hyperparathyroidism: Secondary | ICD-10-CM

## 2014-06-10 NOTE — Progress Notes (Signed)
General Surgery [facility]  Chief Complaint  Patient presents with  . Routine Post Op    parathyroidectomy 03/23/2014    HISTORY: The patient is a 73 year old female who underwent parathyroidectomy in late April. She returns today for final wound check. Laboratory studies on 06/02/2014 show a normal calcium level of 9.6 and a normal intact PTH level of 63.2.  Patient notes that her chronic fatigue has improved. Bone and joint pain has also improved.  EXAM: Surgical wounds healed nicely. Good cosmetic result. No sign of infection. No sign of seroma. Voice quality is normal.  IMPRESSION: Status post minimally invasive parathyroidectomy  PLAN: Patient will return for surgical care as needed.  Earnstine Regal, MD, [facility]   Visit Diagnoses: 1. Hyperparathyroidism, primary

## 2014-06-28 DIAGNOSIS — G5600 Carpal tunnel syndrome, unspecified upper limb: Secondary | ICD-10-CM | POA: Diagnosis not present

## 2014-06-28 DIAGNOSIS — M25549 Pain in joints of unspecified hand: Secondary | ICD-10-CM | POA: Diagnosis not present

## 2014-06-30 ENCOUNTER — Ambulatory Visit: Payer: Self-pay | Admitting: Internal Medicine

## 2014-06-30 DIAGNOSIS — Z1231 Encounter for screening mammogram for malignant neoplasm of breast: Secondary | ICD-10-CM | POA: Diagnosis not present

## 2014-06-30 DIAGNOSIS — M899 Disorder of bone, unspecified: Secondary | ICD-10-CM | POA: Diagnosis not present

## 2014-06-30 LAB — HM MAMMOGRAPHY: HM Mammogram: NEGATIVE

## 2014-06-30 LAB — HM DEXA SCAN

## 2014-07-01 ENCOUNTER — Encounter: Payer: Self-pay | Admitting: *Deleted

## 2014-07-01 ENCOUNTER — Telehealth: Payer: Self-pay | Admitting: Internal Medicine

## 2014-07-01 DIAGNOSIS — M8580 Other specified disorders of bone density and structure, unspecified site: Secondary | ICD-10-CM

## 2014-07-01 NOTE — Assessment & Plan Note (Signed)
There has been progressive bone loss by repeat DEXA scan compared to 2008 study .  She should consider starting definitive therapy . I recommend she make an appt to discuss available options

## 2014-07-28 ENCOUNTER — Encounter: Payer: Self-pay | Admitting: Internal Medicine

## 2014-07-28 ENCOUNTER — Ambulatory Visit (INDEPENDENT_AMBULATORY_CARE_PROVIDER_SITE_OTHER): Payer: Medicare Other | Admitting: Internal Medicine

## 2014-07-28 VITALS — BP 130/72 | HR 78 | Temp 98.1°F | Resp 16 | Ht 63.0 in | Wt 124.5 lb

## 2014-07-28 DIAGNOSIS — G5602 Carpal tunnel syndrome, left upper limb: Secondary | ICD-10-CM

## 2014-07-28 DIAGNOSIS — M949 Disorder of cartilage, unspecified: Secondary | ICD-10-CM

## 2014-07-28 DIAGNOSIS — M899 Disorder of bone, unspecified: Secondary | ICD-10-CM

## 2014-07-28 DIAGNOSIS — M8580 Other specified disorders of bone density and structure, unspecified site: Secondary | ICD-10-CM

## 2014-07-28 DIAGNOSIS — E210 Primary hyperparathyroidism: Secondary | ICD-10-CM | POA: Diagnosis not present

## 2014-07-28 DIAGNOSIS — G5600 Carpal tunnel syndrome, unspecified upper limb: Secondary | ICD-10-CM

## 2014-07-28 DIAGNOSIS — T148XXA Other injury of unspecified body region, initial encounter: Secondary | ICD-10-CM | POA: Diagnosis not present

## 2014-07-28 NOTE — Patient Instructions (Signed)
I recommend getting the majority of your calcium and Vitamin D  through diet rather than supplements given the recent association of calcium supplements with increased coronary artery calcium scores (You need 1200 mg daily )    almond/coconut milk is a great low calorie low carb, cholesterol free  way to increase your dietary calcium and vitamin D.  Try the blue Raven [name]    We will repeat the DEXA in 2 years.   X ray of right tibia soon

## 2014-07-28 NOTE — Progress Notes (Signed)
Pre-visit discussion using our clinic review tool. No additional management support is needed unless otherwise documented below in the visit note.  
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
## 2014-07-28 NOTE — Assessment & Plan Note (Addendum)
etiology unclear.  Plain films of tibia have been ordered to rule out bone mass, although area is nontender and alk phos is normal so osteosarcoma, although considered , is unlikely.

## 2014-07-30 DIAGNOSIS — G5600 Carpal tunnel syndrome, unspecified upper limb: Secondary | ICD-10-CM | POA: Diagnosis not present

## 2014-07-30 DIAGNOSIS — Z79899 Other long term (current) drug therapy: Secondary | ICD-10-CM | POA: Diagnosis not present

## 2014-07-31 ENCOUNTER — Encounter: Payer: Self-pay | Admitting: Internal Medicine

## 2014-07-31 NOTE — Assessment & Plan Note (Signed)
Secondary to primary hyperparathyroidism, now resolved post parathyroidectomy May 2015

## 2014-07-31 NOTE — Assessment & Plan Note (Signed)
Managed with partial parathyroidectomy for persistent hypercalcemia, by surgeon Coletta Memos.  Calcium levels have normalized.   Lab Results  Component Value Date   PTH 63.2 06/02/2014   CALCIUM 9.6 06/02/2014   CAION 1.28 10/15/2013   PHOS 2.8 12/16/2013

## 2014-07-31 NOTE — Assessment & Plan Note (Signed)
Progressive by repeat DEXA scans wit T scores -2.3 .  She had stopped calcium supplemements due to hyperPTH and hypercalcemia, which has been resolved surgically.  Discussed pharmacotherapy,  She has deferred for now.  Discussed the current controversies surrounding the risks and benefits of calcium supplementation.  Encouraged her to increase dietary calcium through natural foods including almond/coconut milk

## 2014-08-06 ENCOUNTER — Encounter: Payer: Self-pay | Admitting: Internal Medicine

## 2014-08-06 ENCOUNTER — Ambulatory Visit (INDEPENDENT_AMBULATORY_CARE_PROVIDER_SITE_OTHER)
Admission: RE | Admit: 2014-08-06 | Discharge: 2014-08-06 | Disposition: A | Discharge status: Home / Self Care | Payer: Medicare Other | Source: Ambulatory Visit | Attending: Internal Medicine | Admitting: Internal Medicine

## 2014-08-06 DIAGNOSIS — S8010XA Contusion of unspecified lower leg, initial encounter: Secondary | ICD-10-CM | POA: Diagnosis not present

## 2014-08-06 DIAGNOSIS — T148XXA Other injury of unspecified body region, initial encounter: Secondary | ICD-10-CM

## 2014-08-11 NOTE — Telephone Encounter (Signed)
Mailed unread message to pt  

## 2014-08-26 IMAGING — MG MM CAD SCREENING MAMMO
1 series · 5 of 5 positions shown · non-contrast
Comparison: none

REASON FOR EXAM: SCR MAMMO NO ORDER
COMMENTS:

PROCEDURE:     MAM - MAM DGTL SCRN MAM NO ORDER W/CAD  - December 31, 2012  [DATE]
RESULT:     COMPARISON:  12/19/2011, 07/26/2009, 06/24/2007
TECHNIQUE: Digital screening mammograms were obtained. FDA approved
computer-aided detection (CAD) for mammography was utilized for this study.
BREAST COMPOSITION: The breast composition is HETEROGENEOUSLY DENSE
(glandular tissue is 51-75%). This may decrease the sensitivity of
mammography.

[R CC · right · 5 of 5 slices shown]
[im 1/5]
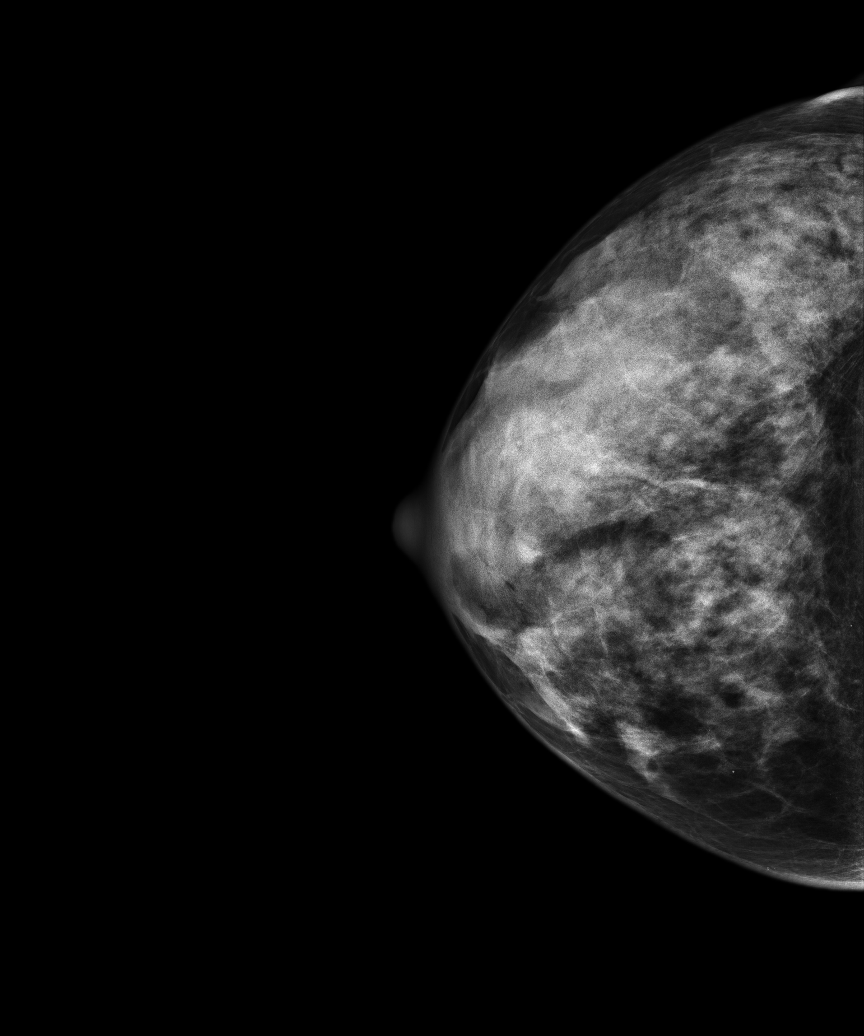
[im 2/5]
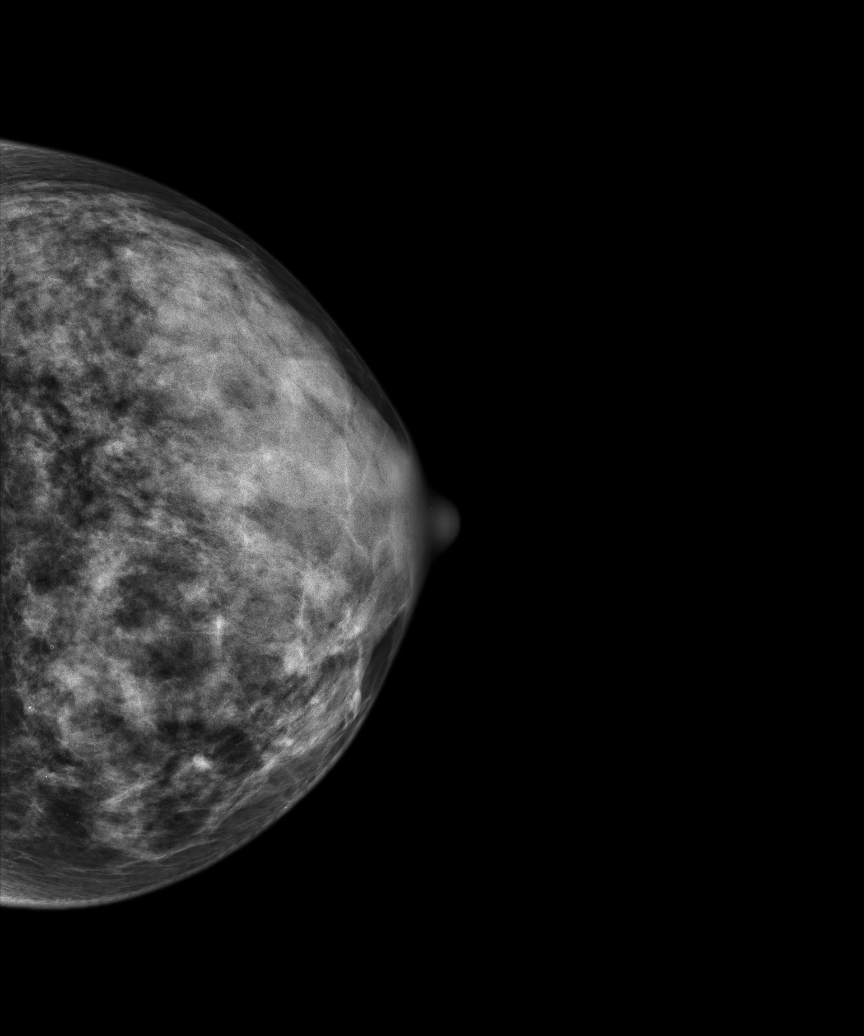
[im 3/5]
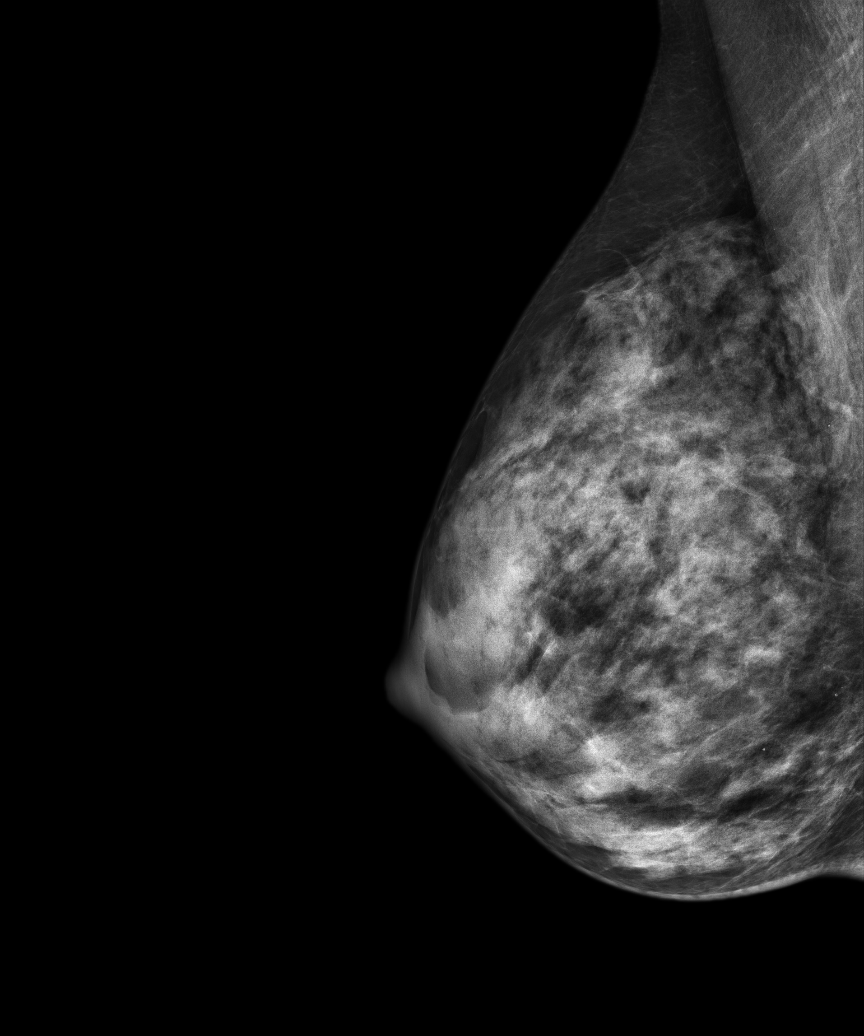
[im 4/5]
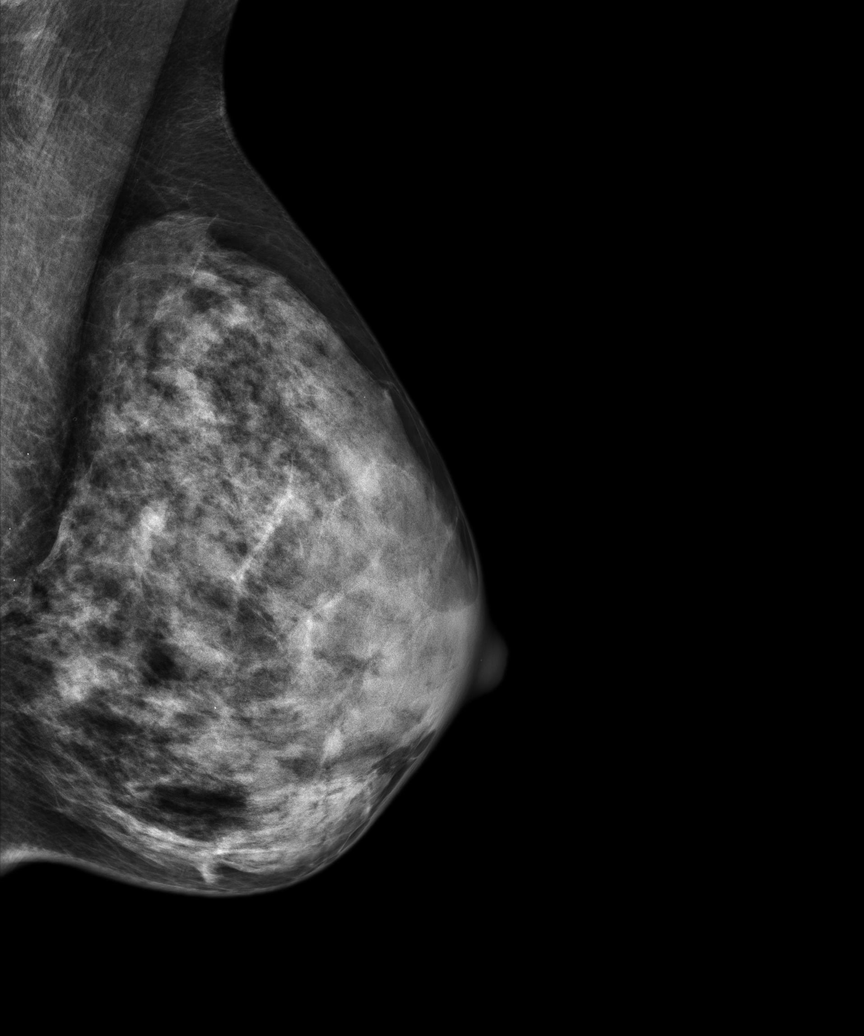
[im 5/5]
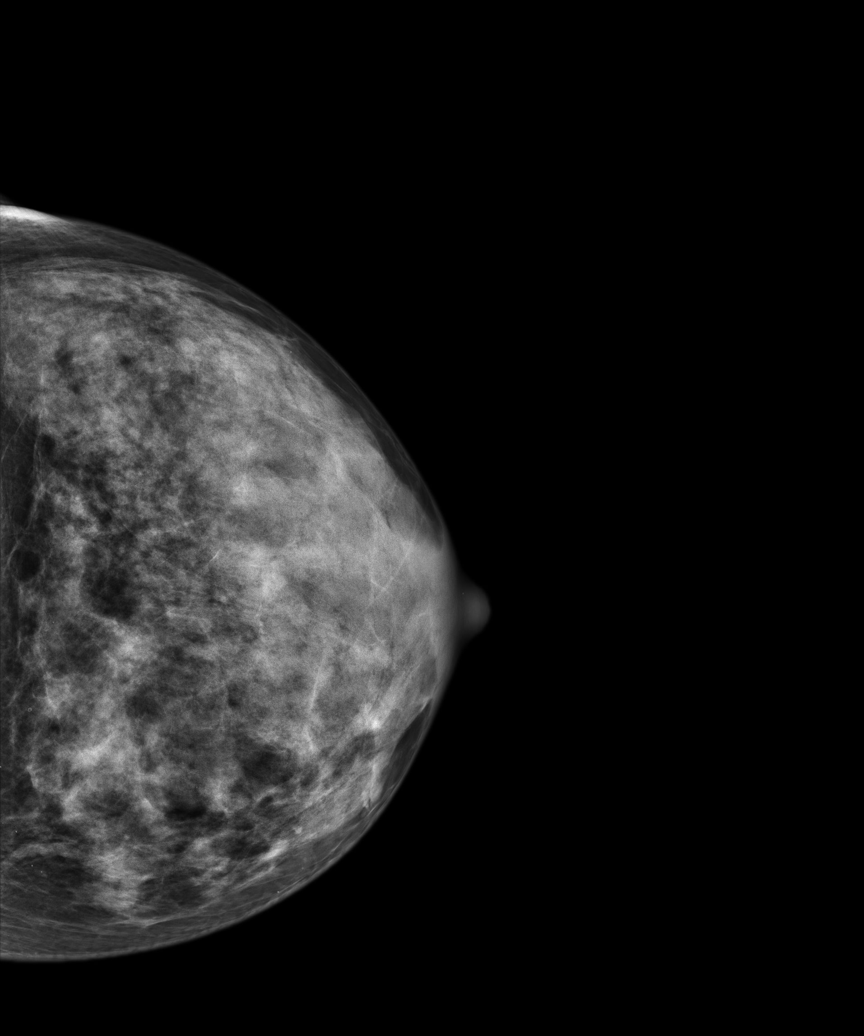

[5 of 5 positions shown; findings below may reference images not displayed]

FINDING: There is no dominant mass, architectural distortion or clusters of
suspicious microcalcifications.
IMPRESSION: 1.     Stable bilateral mammogram.
2.     Annual mammographic follow up recommended.

BI-RADS:  Category 2- Benign.

A negative mammogram report does not preclude biopsy or other evaluation of
a clinically palpable or otherwise suspicious mass or lesion. Breast cancer
may not be detected by mammography in up to 10% of cases.

[REDACTED]

## 2014-09-16 DIAGNOSIS — Z23 Encounter for immunization: Secondary | ICD-10-CM | POA: Diagnosis not present

## 2014-10-08 ENCOUNTER — Ambulatory Visit (INDEPENDENT_AMBULATORY_CARE_PROVIDER_SITE_OTHER): Payer: Medicare Other | Admitting: Internal Medicine

## 2014-10-08 ENCOUNTER — Encounter: Payer: Self-pay | Admitting: Internal Medicine

## 2014-10-08 VITALS — BP 130/78 | HR 70 | Temp 98.0°F | Resp 16 | Ht 62.75 in | Wt 123.5 lb

## 2014-10-08 DIAGNOSIS — E785 Hyperlipidemia, unspecified: Secondary | ICD-10-CM | POA: Diagnosis not present

## 2014-10-08 DIAGNOSIS — Z79899 Other long term (current) drug therapy: Secondary | ICD-10-CM | POA: Diagnosis not present

## 2014-10-08 DIAGNOSIS — E210 Primary hyperparathyroidism: Secondary | ICD-10-CM | POA: Diagnosis not present

## 2014-10-08 DIAGNOSIS — Z0000 Encounter for general adult medical examination without abnormal findings: Secondary | ICD-10-CM | POA: Diagnosis not present

## 2014-10-08 DIAGNOSIS — E039 Hypothyroidism, unspecified: Secondary | ICD-10-CM | POA: Diagnosis not present

## 2014-10-08 DIAGNOSIS — Z23 Encounter for immunization: Secondary | ICD-10-CM

## 2014-10-08 LAB — COMPREHENSIVE METABOLIC PANEL
ALT: 22 U/L (ref 0–35)
AST: 29 U/L (ref 0–37)
Albumin: 3.7 g/dL (ref 3.5–5.2)
Alkaline Phosphatase: 53 U/L (ref 39–117)
BUN: 12 mg/dL (ref 6–23)
CO2: 23 mEq/L (ref 19–32)
Calcium: 9.8 mg/dL (ref 8.4–10.5)
Chloride: 103 mEq/L (ref 96–112)
Creatinine, Ser: 0.7 mg/dL (ref 0.4–1.2)
GFR: 83.4 mL/min (ref >60.00)
Glucose, Bld: 90 mg/dL (ref 70–99)
Potassium: 4.1 mEq/L (ref 3.5–5.1)
Sodium: 139 mEq/L (ref 135–145)
Total Bilirubin: 0.8 mg/dL (ref 0.2–1.2)
Total Protein: 6.9 g/dL (ref 6.0–8.3)

## 2014-10-08 LAB — TSH: TSH: 1.25 u[IU]/mL (ref 0.35–4.50)

## 2014-10-08 NOTE — Progress Notes (Signed)
Patient ID: Raven [name], female   DOB: 1941/07/15, 73 y.o.   MRN: [id number]  The patient is here for annual Medicare wellness examination and management of other chronic and acute problems.    Left carpal tunnel release surgery a success in September .  Complicated only by postop  severe constipation  2 weeks later had another episodes of severe abd pain/ cramping in the afternoon. . Pain lasted several hours , then resolved with  A bowel  movement  Which was hard and large,  Has been normal since then. .     The risk factors are reflected in the social history.  The roster of all physicians providing medical care to patient - is listed in the Snapshot section of the chart.  Activities of daily living:  The patient is 100% independent in all ADLs: dressing, toileting, feeding as well as independent mobility  Home safety : The patient has smoke detectors in the home. They wear seatbelts.  There are no firearms at home. There is no violence in the home.   There is no risks for hepatitis, STDs or HIV. There is no   history of blood transfusion. They have no travel history to infectious disease endemic areas of the world.  The patient has seen their dentist in the last six month. They have seen their eye doctor in the last year. They admit to slight hearing difficulty with regard to whispered voices and some television programs.  They have deferred audiologic testing in the last year.  They do not  have excessive sun exposure. Discussed the need for sun protection: hats, long sleeves and use of sunscreen if there is significant sun exposure.   Diet: the importance of a healthy diet is discussed. They do have a healthy diet.  The benefits of regular aerobic exercise were discussed. She walks 4 times per week ,  20 minutes.   Depression screen: there are no signs or vegative symptoms of depression- irritability, change in appetite, anhedonia, sadness/tearfullness.  Cognitive assessment: the  patient manages all their financial and personal affairs and is actively engaged. They could relate day,date,year and events; recalled 2/3 objects at 3 minutes; performed clock-face test normally.  The following portions of the patient's history were reviewed and updated as appropriate: allergies, current medications, past family history, past medical history,  past surgical history, past social history  and problem list.  Visual acuity was not assessed per patient preference since she has regular follow up with her ophthalmologist. Hearing and body mass index were assessed and reviewed.   During the course of the visit the patient was educated and counseled about appropriate screening and preventive services including : fall prevention , diabetes screening, nutrition counseling, colorectal cancer screening, and recommended immunizations.     Objective:  BP 130/78 mmHg  Pulse 70  Temp(Src) 98 F (36.7 C) (Oral)  Resp 16  Ht 5' 2.75" (1.594 m)  Wt 123 lb 8 oz (56.019 kg)  BMI 22.05 kg/m2  SpO2 96%  General appearance: alert, cooperative and appears stated age Head: Normocephalic, without obvious abnormality, atraumatic Eyes: conjunctivae/corneas clear. PERRL, EOM's intact. Fundi benign. Ears: normal TM's and external ear canals both ears Nose: Nares normal. Septum midline. Mucosa normal. No drainage or sinus tenderness. Throat: lips, mucosa, and tongue normal; teeth and gums normal Neck: no adenopathy, no carotid bruit, no JVD, supple, symmetrical, trachea midline and thyroid not enlarged, symmetric, no tenderness/mass/nodules Lungs: clear to auscultation bilaterally Breasts: normal appearance, no masses  or tenderness Heart: regular rate and rhythm, S1, S2 normal, no murmur, click, rub or gallop Abdomen: soft, non-tender; bowel sounds normal; no masses,  no organomegaly Extremities: extremities normal, atraumatic, no cyanosis or edema Pulses: 2+ and symmetric Skin: Skin color,  texture, turgor normal. No rashes or lesions Neurologic: Alert and oriented X 3, normal strength and tone. Normal symmetric reflexes. Normal coordination and gait.     Assessment and Plan:  Hyperparathyroidism, primary Secondary to primary hyperparathyroidism, now resolved post parathyroidectomy May 2015.  Repeat calcium today is normal:  Lab Results  Component Value Date   PTH 63.2 06/02/2014   CALCIUM 9.8 10/08/2014   CAION 1.28 10/15/2013   PHOS 2.8 12/16/2013       S/P carpal tunnel release Left wrist  Sept 2015,  Symptoms resolved.   Hyperlipidemia Well controlled on current statin therapy.   Liver enzymes are normal , no changes today.  Lab Results  Component Value Date   CHOL 188 04/13/2014   HDL 77 04/13/2014   LDLCALC 97 04/13/2014   LDLDIRECT 163.0 10/14/2013   TRIG 70 04/13/2014   CHOLHDL 2.4 04/13/2014   Lab Results  Component Value Date   ALT 22 10/08/2014   AST 29 10/08/2014   ALKPHOS 53 10/08/2014   BILITOT 0.8 10/08/2014     Medicare annual wellness visit, subsequent Annual Medicare wellness  exam was done as well as a comprehensive physical exam and management of acute and chronic conditions .  During the course of the visit the patient was educated and counseled about appropriate screening and preventive services including : fall prevention , diabetes screening, nutrition counseling, colorectal cancer screening, and recommended immunizations.  Printed recommendations for health maintenance screenings was given.    Updated Medication List Outpatient Encounter Prescriptions as of 10/08/2014  Medication Sig  . aspirin-acetaminophen-caffeine (EXCEDRIN MIGRAINE) [id number] MG per tablet Take 1 tablet by mouth every 6 (six) hours as needed for headache.  . calcium-vitamin D (OSCAL WITH D) 500-200 MG-UNIT per tablet Take 1 tablet by mouth daily with breakfast.  . ibuprofen (ADVIL,MOTRIN) 200 MG tablet Take 200-400 mg by mouth every 6 (six) hours as  needed for mild pain or moderate pain.  Marland Kitchen levothyroxine (SYNTHROID, LEVOTHROID) 75 MCG tablet Take 75 mcg by mouth daily before breakfast.  . Multiple Vitamin (MULTIVITAMIN WITH MINERALS) TABS tablet Take 1 tablet by mouth daily.  . simvastatin (ZOCOR) 20 MG tablet Take one tablet by mouth daily in the evening

## 2014-10-08 NOTE — Progress Notes (Signed)
Pre-visit discussion using our clinic review tool. No additional management support is needed unless otherwise documented below in the visit note.  

## 2014-10-08 NOTE — Patient Instructions (Signed)
You had your annual  wellness exam today.    We will schedule your 3 d mammogram at [facility] in the summer .   You received the Prevnar  vaccine today.  If you develop a sore arm , use tylenol and ibuprofen for a few days    We will contact you with the bloodwork results  I recommend an increase in fiber intake to 25 to 35 grams daily and a minimum of 3 16 ounce servings of water daily .   There are several dietary sources of high fiber content including Mission low carb whole wheat tortillas which have 26 g fiber/serving, Toufayan flatbread which has around 10 g fiber,     and Atkins protein bars which have 8 to 10 g fiber.    It is perfectly safe to use a stool softener or a bulf forming laxative daily such as  Miralax., metamucil, citrucel, benefiber  or fibercon.   Health Maintenance Adopting a healthy lifestyle and getting preventive care can go a long way to promote health and wellness. Talk with your health care provider about what schedule of regular examinations is right for you. This is a good chance for you to check in with your provider about disease prevention and staying healthy. In between checkups, there are plenty of things you can do on your own. Experts have done a lot of research about which lifestyle changes and preventive measures are most likely to keep you healthy. Ask your health care provider for more information. WEIGHT AND DIET  Eat a healthy diet  Be sure to include plenty of vegetables, fruits, low-fat dairy products, and lean protein.  Do not eat a lot of foods high in solid fats, added sugars, or salt.  Get regular exercise. This is one of the most important things you can do for your health.  Most adults should exercise for at least 150 minutes each week. The exercise should increase your heart rate and make you sweat (moderate-intensity exercise).  Most adults should also do strengthening exercises at least twice a week. This is in addition to the  moderate-intensity exercise.  Maintain a healthy weight  Body mass index (BMI) is a measurement that can be used to identify possible weight problems. It estimates body fat based on height and weight. Your health care provider can help determine your BMI and help you achieve or maintain a healthy weight.  For females 108 years of age and older:   A BMI below 18.5 is considered underweight.  A BMI of 18.5 to 24.9 is normal.  A BMI of 25 to 29.9 is considered overweight.  A BMI of 30 and above is considered obese.  Watch levels of cholesterol and blood lipids  You should start having your blood tested for lipids and cholesterol at 71 years of age, then have this test every 5 years.  You may need to have your cholesterol levels checked more often if:  Your lipid or cholesterol levels are high.  You are older than 71 years of age.  You are at high risk for heart disease.  CANCER SCREENING   Lung Cancer  Lung cancer screening is recommended for adults 4-53 years old who are at high risk for lung cancer because of a history of smoking.  A yearly low-dose CT scan of the lungs is recommended for people who:  Currently smoke.  Have quit within the past 15 years.  Have at least a 30-pack-year history of smoking. A pack year  is smoking an average of one pack of cigarettes a day for 1 year.  Yearly screening should continue until it has been 15 years since you quit.  Yearly screening should stop if you develop a health problem that would prevent you from having lung cancer treatment.  Breast Cancer  Practice breast self-awareness. This means understanding how your breasts normally appear and feel.  It also means doing regular breast self-exams. Let your health care provider know about any changes, no matter how small.  If you are in your 20s or 30s, you should have a clinical breast exam (CBE) by a health care provider every 1-3 years as part of a regular health exam.  If  you are 66 or older, have a CBE every year. Also consider having a breast X-ray (mammogram) every year.  If you have a family history of breast cancer, talk to your health care provider about genetic screening.  If you are at high risk for breast cancer, talk to your health care provider about having an MRI and a mammogram every year.  Breast cancer gene (BRCA) assessment is recommended for women who have family members with BRCA-related cancers. BRCA-related cancers include:  Breast.  Ovarian.  Tubal.  Peritoneal cancers.  Results of the assessment will determine the need for genetic counseling and BRCA1 and BRCA2 testing. Cervical Cancer Routine pelvic examinations to screen for cervical cancer are no longer recommended for nonpregnant women who are considered low risk for cancer of the pelvic organs (ovaries, uterus, and vagina) and who do not have symptoms. A pelvic examination may be necessary if you have symptoms including those associated with pelvic infections. Ask your health care provider if a screening pelvic exam is right for you.   The Pap test is the screening test for cervical cancer for women who are considered at risk.  If you had a hysterectomy for a problem that was not cancer or a condition that could lead to cancer, then you no longer need Pap tests.  If you are older than 65 years, and you have had normal Pap tests for the past 10 years, you no longer need to have Pap tests.  If you have had past treatment for cervical cancer or a condition that could lead to cancer, you need Pap tests and screening for cancer for at least 20 years after your treatment.  If you no longer get a Pap test, assess your risk factors if they change (such as having a new sexual partner). This can affect whether you should start being screened again.  Some women have medical problems that increase their chance of getting cervical cancer. If this is the case for you, your health care  provider may recommend more frequent screening and Pap tests.  The human papillomavirus (HPV) test is another test that may be used for cervical cancer screening. The HPV test looks for the virus that can cause cell changes in the cervix. The cells collected during the Pap test can be tested for HPV.  The HPV test can be used to screen women 61 years of age and older. Getting tested for HPV can extend the interval between normal Pap tests from three to five years.  An HPV test also should be used to screen women of any age who have unclear Pap test results.  After 71 years of age, women should have HPV testing as often as Pap tests.  Colorectal Cancer  This type of cancer can be detected and often  prevented.  Routine colorectal cancer screening usually begins at 71 years of age and continues through 71 years of age.  Your health care provider may recommend screening at an earlier age if you have risk factors for colon cancer.  Your health care provider may also recommend using home test kits to check for hidden blood in the stool.  A small camera at the end of a tube can be used to examine your colon directly (sigmoidoscopy or colonoscopy). This is done to check for the earliest forms of colorectal cancer.  Routine screening usually begins at age 66.  Direct examination of the colon should be repeated every 5-10 years through 71 years of age. However, you may need to be screened more often if early forms of precancerous polyps or small growths are found. Skin Cancer  Check your skin from head to toe regularly.  Tell your health care provider about any new moles or changes in moles, especially if there is a change in a mole's shape or color.  Also tell your health care provider if you have a mole that is larger than the size of a pencil eraser.  Always use sunscreen. Apply sunscreen liberally and repeatedly throughout the day.  Protect yourself by wearing long sleeves, pants, a  wide-brimmed hat, and sunglasses whenever you are outside. HEART DISEASE, DIABETES, AND HIGH BLOOD PRESSURE   Have your blood pressure checked at least every 1-2 years. High blood pressure causes heart disease and increases the risk of stroke.  If you are between 16 years and 21 years old, ask your health care provider if you should take aspirin to prevent strokes.  Have regular diabetes screenings. This involves taking a blood sample to check your fasting blood sugar level.  If you are at a normal weight and have a low risk for diabetes, have this test once every three years after 71 years of age.  If you are overweight and have a high risk for diabetes, consider being tested at a younger age or more often. PREVENTING INFECTION  Hepatitis B  If you have a higher risk for hepatitis B, you should be screened for this virus. You are considered at high risk for hepatitis B if:  You were born in a country where hepatitis B is common. Ask your health care provider which countries are considered high risk.  Your parents were born in a high-risk country, and you have not been immunized against hepatitis B (hepatitis B vaccine).  You have HIV or AIDS.  You use needles to inject street drugs.  You live with someone who has hepatitis B.  You have had sex with someone who has hepatitis B.  You get hemodialysis treatment.  You take certain medicines for conditions, including cancer, organ transplantation, and autoimmune conditions. Hepatitis C  Blood testing is recommended for:  Everyone born from [birth year] through 1963.  Anyone with known risk factors for hepatitis C. Sexually transmitted infections (STIs)  You should be screened for sexually transmitted infections (STIs) including gonorrhea and chlamydia if:  You are sexually active and are younger than 71 years of age.  You are older than 71 years of age and your health care provider tells you that you are at risk for this type of  infection.  Your sexual activity has changed since you were last screened and you are at an increased risk for chlamydia or gonorrhea. Ask your health care provider if you are at risk.  If you do not have HIV,  but are at risk, it may be recommended that you take a prescription medicine daily to prevent HIV infection. This is called pre-exposure prophylaxis (PrEP). You are considered at risk if:  You are sexually active and do not regularly use condoms or know the HIV status of your partner(s).  You take drugs by injection.  You are sexually active with a partner who has HIV. Talk with your health care provider about whether you are at high risk of being infected with HIV. If you choose to begin PrEP, you should first be tested for HIV. You should then be tested every 3 months for as long as you are taking PrEP.  PREGNANCY   If you are premenopausal and you may become pregnant, ask your health care provider about preconception counseling.  If you may become pregnant, take 400 to 800 micrograms (mcg) of folic acid every day.  If you want to prevent pregnancy, talk to your health care provider about birth control (contraception). OSTEOPOROSIS AND MENOPAUSE   Osteoporosis is a disease in which the bones lose minerals and strength with aging. This can result in serious bone fractures. Your risk for osteoporosis can be identified using a bone density scan.  If you are 33 years of age or older, or if you are at risk for osteoporosis and fractures, ask your health care provider if you should be screened.  Ask your health care provider whether you should take a calcium or vitamin D supplement to lower your risk for osteoporosis.  Menopause may have certain physical symptoms and risks.  Hormone replacement therapy may reduce some of these symptoms and risks. Talk to your health care provider about whether hormone replacement therapy is right for you.  HOME CARE INSTRUCTIONS   Schedule regular  health, dental, and eye exams.  Stay current with your immunizations.   Do not use any tobacco products including cigarettes, chewing tobacco, or electronic cigarettes.  If you are pregnant, do not drink alcohol.  If you are breastfeeding, limit how much and how often you drink alcohol.  Limit alcohol intake to no more than 1 drink per day for nonpregnant women. One drink equals 12 ounces of beer, 5 ounces of wine, or 1 ounces of hard liquor.  Do not use street drugs.  Do not share needles.  Ask your health care provider for help if you need support or information about quitting drugs.  Tell your health care provider if you often feel depressed.  Tell your health care provider if you have ever been abused or do not feel safe at home. Document Released: 05/28/2011 Document Revised: 03/29/2014 Document Reviewed: 10/14/2013 [facility] Patient Information 2015 [place], [place]. This information is not intended to replace advice given to you by your health care provider. Make sure you discuss any questions you have with your health care provider.

## 2014-10-10 ENCOUNTER — Encounter: Payer: Self-pay | Admitting: Internal Medicine

## 2014-10-10 NOTE — Assessment & Plan Note (Signed)
Well controlled on current statin therapy.   Liver enzymes are normal , no changes today.  Lab Results  Component Value Date   CHOL 188 04/13/2014   HDL 77 04/13/2014   LDLCALC 97 04/13/2014   LDLDIRECT 163.0 10/14/2013   TRIG 70 04/13/2014   CHOLHDL 2.4 04/13/2014   Lab Results  Component Value Date   ALT 22 10/08/2014   AST 29 10/08/2014   ALKPHOS 53 10/08/2014   BILITOT 0.8 10/08/2014

## 2014-10-10 NOTE — Assessment & Plan Note (Signed)

## 2014-10-10 NOTE — Assessment & Plan Note (Signed)
Left wrist  Sept 2015,  Symptoms resolved.

## 2014-10-10 NOTE — Assessment & Plan Note (Signed)
Secondary to primary hyperparathyroidism, now resolved post parathyroidectomy May 2015.  Repeat calcium today is normal:  Lab Results  Component Value Date   PTH 63.2 06/02/2014   CALCIUM 9.8 10/08/2014   CAION 1.28 10/15/2013   PHOS 2.8 12/16/2013

## 2014-11-02 ENCOUNTER — Other Ambulatory Visit: Payer: Self-pay | Admitting: Internal Medicine

## 2014-11-04 DIAGNOSIS — D1800 Hemangioma unspecified site: Secondary | ICD-10-CM | POA: Diagnosis not present

## 2014-11-04 DIAGNOSIS — L578 Other skin changes due to chronic exposure to nonionizing radiation: Secondary | ICD-10-CM | POA: Diagnosis not present

## 2014-11-04 DIAGNOSIS — D225 Melanocytic nevi of trunk: Secondary | ICD-10-CM | POA: Diagnosis not present

## 2014-11-04 DIAGNOSIS — L821 Other seborrheic keratosis: Secondary | ICD-10-CM | POA: Diagnosis not present

## 2014-11-04 DIAGNOSIS — D485 Neoplasm of uncertain behavior of skin: Secondary | ICD-10-CM | POA: Diagnosis not present

## 2014-11-04 DIAGNOSIS — Z1283 Encounter for screening for malignant neoplasm of skin: Secondary | ICD-10-CM | POA: Diagnosis not present

## 2014-11-04 DIAGNOSIS — L814 Other melanin hyperpigmentation: Secondary | ICD-10-CM | POA: Diagnosis not present

## 2014-12-21 ENCOUNTER — Telehealth: Payer: Self-pay | Admitting: *Deleted

## 2014-12-21 DIAGNOSIS — H34211 Partial retinal artery occlusion, right eye: Secondary | ICD-10-CM | POA: Diagnosis not present

## 2014-12-21 DIAGNOSIS — I7090 Unspecified atherosclerosis: Secondary | ICD-10-CM

## 2014-12-21 NOTE — Telephone Encounter (Signed)
Dr. Kem Parkinson from [facility] called, will dictate report tomorrow but wanted to give heads up today, she saw pt today for eye exam. Noticed arterial plaque, recommendations are cardiovascular work up within 1 week.

## 2014-12-21 NOTE — Telephone Encounter (Signed)
Please ask patient to make appt for fasting lipids.  She should be still be taking simvastatin.  i will also make a referral to [facility] for risk stratification and carotid dopplers

## 2014-12-22 NOTE — Telephone Encounter (Signed)
Left message for pt to return my call. And sent mychart

## 2014-12-23 NOTE — Telephone Encounter (Signed)
Pt made lab appt

## 2014-12-28 ENCOUNTER — Other Ambulatory Visit (INDEPENDENT_AMBULATORY_CARE_PROVIDER_SITE_OTHER): Payer: Medicare Other

## 2014-12-28 DIAGNOSIS — E785 Hyperlipidemia, unspecified: Secondary | ICD-10-CM

## 2014-12-28 DIAGNOSIS — I7090 Unspecified atherosclerosis: Secondary | ICD-10-CM | POA: Diagnosis not present

## 2014-12-28 LAB — COMPREHENSIVE METABOLIC PANEL
ALT: 23 U/L (ref 0–35)
AST: 28 U/L (ref 0–37)
Albumin: 4.3 g/dL (ref 3.5–5.2)
Alkaline Phosphatase: 49 U/L (ref 39–117)
BUN: 20 mg/dL (ref 6–23)
CO2: 30 mEq/L (ref 19–32)
Calcium: 9.5 mg/dL (ref 8.4–10.5)
Chloride: 104 mEq/L (ref 96–112)
Creatinine, Ser: 0.82 mg/dL (ref 0.40–1.20)
GFR: 72.88 mL/min (ref >60.00)
Glucose, Bld: 86 mg/dL (ref 70–99)
Potassium: 4.2 mEq/L (ref 3.5–5.1)
Sodium: 140 mEq/L (ref 135–145)
Total Bilirubin: 0.4 mg/dL (ref 0.2–1.2)
Total Protein: 6.7 g/dL (ref 6.0–8.3)

## 2014-12-28 LAB — LIPID PANEL
Cholesterol: 211 mg/dL — ABNORMAL HIGH (ref 0–200)
HDL: 79.4 mg/dL (ref >39.00)
LDL Cholesterol: 116 mg/dL — ABNORMAL HIGH (ref 0–99)
NonHDL: 131.6
Total CHOL/HDL Ratio: 3
Triglycerides: 77 mg/dL (ref 0.0–149.0)
VLDL: 15.4 mg/dL (ref 0.0–40.0)

## 2014-12-31 ENCOUNTER — Encounter: Payer: Self-pay | Admitting: Internal Medicine

## 2015-01-02 ENCOUNTER — Telehealth: Payer: Self-pay | Admitting: Internal Medicine

## 2015-01-02 DIAGNOSIS — H34213 Partial retinal artery occlusion, bilateral: Secondary | ICD-10-CM

## 2015-01-02 NOTE — Telephone Encounter (Signed)
Additional labs are needed to rle out conditions suggested by her recent abnormal eye exam.  I have ordered the labs, as well as the cardiology evaluation and the carotid doppler can be done at the cardiologists office as well.

## 2015-01-03 NOTE — Telephone Encounter (Signed)
Sent mychart

## 2015-01-10 ENCOUNTER — Encounter: Payer: Self-pay | Admitting: Cardiovascular Disease

## 2015-01-10 ENCOUNTER — Ambulatory Visit (INDEPENDENT_AMBULATORY_CARE_PROVIDER_SITE_OTHER): Payer: Medicare Other | Admitting: Cardiovascular Disease

## 2015-01-10 VITALS — BP 142/88 | HR 59 | Ht 63.0 in | Wt 126.5 lb

## 2015-01-10 DIAGNOSIS — I6529 Occlusion and stenosis of unspecified carotid artery: Secondary | ICD-10-CM | POA: Diagnosis not present

## 2015-01-10 DIAGNOSIS — E785 Hyperlipidemia, unspecified: Secondary | ICD-10-CM

## 2015-01-10 MED ORDER — SIMVASTATIN 40 MG PO TABS: 40.0000 mg | ORAL_TABLET | Freq: Every day | ORAL | Status: DC
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
## 2015-01-10 NOTE — Patient Instructions (Signed)
Your physician has requested that you have a carotid duplex. This test is an ultrasound of the carotid arteries in your neck. It looks at blood flow through these arteries that supply the brain with blood. Allow one hour for this exam. There are no restrictions or special instructions.  Increase Simvastatin to 40 mg once daily.   Follow up as needed.

## 2015-01-10 NOTE — Assessment & Plan Note (Signed)
Lab Results  Component Value Date   CHOL 211* 12/28/2014   HDL 79.40 12/28/2014   LDLCALC 116* 12/28/2014   LDLDIRECT 163.0 10/14/2013   TRIG 77.0 12/28/2014   CHOLHDL 3 12/28/2014   High HDL is protective. However, given evidence of underlying atherosclerosis, I recommend a target LDL of less than 100. Thus, I increased the dose of simvastatin to 40 mg once daily.

## 2015-01-10 NOTE — Assessment & Plan Note (Signed)
The patient was recently found to have recurrent artery atherosclerosis and also reports bilateral carotid stenosis on previous screening. Overall lifestyle is relatively healthy. I agree with carotid Doppler which was scheduled today. We discussed the importance of continued lifestyle changes in controlling risk factors especially hyperlipidemia.

## 2015-01-19 ENCOUNTER — Other Ambulatory Visit (INDEPENDENT_AMBULATORY_CARE_PROVIDER_SITE_OTHER): Payer: Medicare Other

## 2015-01-19 DIAGNOSIS — H34213 Partial retinal artery occlusion, bilateral: Secondary | ICD-10-CM | POA: Diagnosis not present

## 2015-01-19 LAB — CBC WITH DIFFERENTIAL/PLATELET
Basophils Absolute: 0.1 10*3/uL (ref 0.0–0.1)
Basophils Relative: 1.3 % (ref 0.0–3.0)
Eosinophils Absolute: 0.1 10*3/uL (ref 0.0–0.7)
Eosinophils Relative: 1.3 % (ref 0.0–5.0)
HCT: 38 % (ref 36.0–46.0)
Hemoglobin: 12.9 g/dL (ref 12.0–15.0)
Lymphocytes Relative: 35.1 % (ref 12.0–46.0)
Lymphs Abs: 1.8 10*3/uL (ref 0.7–4.0)
MCHC: 33.9 g/dL (ref 30.0–36.0)
MCV: 92.7 fl (ref 78.0–100.0)
Monocytes Absolute: 0.3 10*3/uL (ref 0.1–1.0)
Monocytes Relative: 6.7 % (ref 3.0–12.0)
Neutro Abs: 2.9 10*3/uL (ref 1.4–7.7)
Neutrophils Relative %: 55.6 % (ref 43.0–77.0)
Platelets: 223 10*3/uL (ref 150.0–400.0)
RBC: 4.1 Mil/uL (ref 3.87–5.11)
RDW: 13.3 % (ref 11.5–15.5)
WBC: 5.1 10*3/uL (ref 4.0–10.5)

## 2015-01-19 LAB — SEDIMENTATION RATE: Sed Rate: 18 mm/hr (ref 0–22)

## 2015-01-19 LAB — APTT: aPTT: 29.6 s (ref 23.4–32.7)

## 2015-01-19 LAB — PROTIME-INR
INR: 1 ratio (ref 0.8–1.0)
Prothrombin Time: 10.7 s (ref 9.6–13.1)

## 2015-01-19 LAB — HEMOGLOBIN A1C: Hgb A1c MFr Bld: 5.7 % (ref 4.6–6.5)

## 2015-01-20 ENCOUNTER — Encounter: Payer: Self-pay | Admitting: Internal Medicine

## 2015-01-20 DIAGNOSIS — H3509 Other intraretinal microvascular abnormalities: Secondary | ICD-10-CM | POA: Insufficient documentation

## 2015-01-20 DIAGNOSIS — I708 Atherosclerosis of other arteries: Secondary | ICD-10-CM

## 2015-01-21 DIAGNOSIS — H269 Unspecified cataract: Secondary | ICD-10-CM | POA: Diagnosis not present

## 2015-01-25 ENCOUNTER — Encounter (INDEPENDENT_AMBULATORY_CARE_PROVIDER_SITE_OTHER): Payer: Medicare Other

## 2015-01-25 DIAGNOSIS — I6529 Occlusion and stenosis of unspecified carotid artery: Secondary | ICD-10-CM | POA: Diagnosis not present

## 2015-01-25 DIAGNOSIS — E785 Hyperlipidemia, unspecified: Secondary | ICD-10-CM

## 2015-03-22 DIAGNOSIS — L578 Other skin changes due to chronic exposure to nonionizing radiation: Secondary | ICD-10-CM | POA: Diagnosis not present

## 2015-03-22 DIAGNOSIS — L814 Other melanin hyperpigmentation: Secondary | ICD-10-CM | POA: Diagnosis not present

## 2015-03-22 DIAGNOSIS — L570 Actinic keratosis: Secondary | ICD-10-CM | POA: Diagnosis not present

## 2015-03-22 DIAGNOSIS — D1800 Hemangioma unspecified site: Secondary | ICD-10-CM | POA: Diagnosis not present

## 2015-03-22 DIAGNOSIS — L821 Other seborrheic keratosis: Secondary | ICD-10-CM | POA: Diagnosis not present

## 2015-04-05 ENCOUNTER — Other Ambulatory Visit: Payer: Federal, State, Local not specified - PPO

## 2015-04-07 ENCOUNTER — Other Ambulatory Visit: Payer: Medicare Other

## 2015-04-07 ENCOUNTER — Telehealth: Payer: Self-pay | Admitting: *Deleted

## 2015-04-07 NOTE — Telephone Encounter (Signed)
Labs and dX?  

## 2015-04-07 NOTE — Telephone Encounter (Signed)
i didn't order any,  Not due until august

## 2015-04-08 ENCOUNTER — Encounter: Payer: Self-pay | Admitting: Internal Medicine

## 2015-04-08 ENCOUNTER — Ambulatory Visit (INDEPENDENT_AMBULATORY_CARE_PROVIDER_SITE_OTHER): Payer: Medicare Other | Admitting: Internal Medicine

## 2015-04-08 VITALS — BP 112/70 | HR 64 | Resp 14 | Ht 63.0 in | Wt 124.5 lb

## 2015-04-08 DIAGNOSIS — I6529 Occlusion and stenosis of unspecified carotid artery: Secondary | ICD-10-CM

## 2015-04-08 DIAGNOSIS — Z7189 Other specified counseling: Secondary | ICD-10-CM

## 2015-04-08 DIAGNOSIS — Z7184 Encounter for health counseling related to travel: Secondary | ICD-10-CM

## 2015-04-08 DIAGNOSIS — E785 Hyperlipidemia, unspecified: Secondary | ICD-10-CM

## 2015-04-08 DIAGNOSIS — K5900 Constipation, unspecified: Secondary | ICD-10-CM

## 2015-04-08 DIAGNOSIS — E038 Other specified hypothyroidism: Secondary | ICD-10-CM | POA: Diagnosis not present

## 2015-04-08 DIAGNOSIS — H3509 Other intraretinal microvascular abnormalities: Secondary | ICD-10-CM

## 2015-04-08 DIAGNOSIS — Z79899 Other long term (current) drug therapy: Secondary | ICD-10-CM | POA: Diagnosis not present

## 2015-04-08 DIAGNOSIS — E034 Atrophy of thyroid (acquired): Secondary | ICD-10-CM

## 2015-04-08 DIAGNOSIS — I708 Atherosclerosis of other arteries: Secondary | ICD-10-CM

## 2015-04-08 LAB — LIPID PANEL
Cholesterol: 176 mg/dL (ref 0–200)
HDL: 73.6 mg/dL (ref >39.00)
LDL Cholesterol: 88 mg/dL (ref 0–99)
NonHDL: 102.4
Total CHOL/HDL Ratio: 2
Triglycerides: 72 mg/dL (ref 0.0–149.0)
VLDL: 14.4 mg/dL (ref 0.0–40.0)

## 2015-04-08 LAB — COMPREHENSIVE METABOLIC PANEL
ALT: 16 U/L (ref 0–35)
AST: 21 U/L (ref 0–37)
Albumin: 3.9 g/dL (ref 3.5–5.2)
Alkaline Phosphatase: 46 U/L (ref 39–117)
BUN: 17 mg/dL (ref 6–23)
CO2: 29 mEq/L (ref 19–32)
Calcium: 9.4 mg/dL (ref 8.4–10.5)
Chloride: 103 mEq/L (ref 96–112)
Creatinine, Ser: 0.72 mg/dL (ref 0.40–1.20)
GFR: 84.62 mL/min (ref >60.00)
Glucose, Bld: 96 mg/dL (ref 70–99)
Potassium: 4 mEq/L (ref 3.5–5.1)
Sodium: 136 mEq/L (ref 135–145)
Total Bilirubin: 0.6 mg/dL (ref 0.2–1.2)
Total Protein: 6.4 g/dL (ref 6.0–8.3)

## 2015-04-08 LAB — TSH: TSH: 0.94 u[IU]/mL (ref 0.35–4.50)

## 2015-04-08 MED ORDER — LEVOFLOXACIN 500 MG PO TABS: 500.0000 mg | ORAL_TABLET | Freq: Every day | ORAL | Status: DC

## 2015-04-08 NOTE — Patient Instructions (Signed)
Your cough may be coming from reflux, allergies, or post nasal drip (PND).  PND and allergies can be treated with benadryl,  Allegra, Zyrtec and claritin. Benadryl is the most effective for drying you up but it is also the most sedating,  So try taking it at night and use one of the others in the daytime  Consider using [employer]Med's sinus rinse to flush your sinuses twice daily when you have congestion to prevent sinus infections   Take the Levaquin and probiotic with you to [place].  Do not start unless you have cough with fever and body aches,   sinus pain with  green/brown blood streaked nsasal drainage,  Or Urinary symptoms  Please take a probiotic ( Align, Floraque or Culturelle) for a minimum of 3 weeks if you are on the antibiotic to prevent a serious antibiotic associated diarrhea  Called clostirudium dificile colitis and a vaginal yeast infection
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
## 2015-04-08 NOTE — Progress Notes (Signed)
Pre visit review using our clinic review tool, if applicable. No additional management support is needed unless otherwise documented below in the visit note. 

## 2015-04-09 ENCOUNTER — Encounter: Payer: Self-pay | Admitting: Internal Medicine

## 2015-04-09 DIAGNOSIS — K5900 Constipation, unspecified: Secondary | ICD-10-CM | POA: Insufficient documentation

## 2015-04-09 DIAGNOSIS — E039 Hypothyroidism, unspecified: Secondary | ICD-10-CM | POA: Insufficient documentation

## 2015-04-09 DIAGNOSIS — Z7184 Encounter for health counseling related to travel: Secondary | ICD-10-CM | POA: Insufficient documentation

## 2015-04-09 MED ORDER — LEVOTHYROXINE SODIUM 75 MCG PO TABS: 75.0000 ug | ORAL_TABLET | Freq: Every day | ORAL | Status: DC

## 2015-04-09 NOTE — Assessment & Plan Note (Signed)
Thyroid function is WNL on current dose.  No current changes needed.   Lab Results  Component Value Date   TSH 0.94 04/08/2015   .

## 2015-04-09 NOTE — Assessment & Plan Note (Addendum)
Advised to use defensive measures with use of mask on flight if needed.  Prescriptions for antibiotics  given for use if certain conditions develop, and probiotics strongly advised as well.

## 2015-04-09 NOTE — Assessment & Plan Note (Addendum)
LDL  Is < 100  on current medications. She has no side effects and liver enzymes are normal. No changes today.  Lab Results  Component Value Date   CHOL 176 04/08/2015   HDL 73.60 04/08/2015   LDLCALC 88 04/08/2015   LDLDIRECT 163.0 10/14/2013   TRIG 72.0 04/08/2015   CHOLHDL 2 04/08/2015   Lab Results  Component Value Date   ALT 16 04/08/2015   AST 21 04/08/2015   ALKPHOS 46 04/08/2015   BILITOT 0.6 04/08/2015

## 2015-04-09 NOTE — Assessment & Plan Note (Signed)
Use of bulk forming laxatives advised and encouraged to use instead of stimulatn laxatives which have cuased abdominal cramping and loose stools in the past.

## 2015-04-09 NOTE — Assessment & Plan Note (Signed)
Simvastatin dose was increased for goal LDL < 100 by Dr Fletcher Anon.LDL is now < 100, and HDL remains high.  No changes today.  Lab Results  Component Value Date   CHOL 176 04/08/2015   HDL 73.60 04/08/2015   LDLCALC 88 04/08/2015   LDLDIRECT 163.0 10/14/2013   TRIG 72.0 04/08/2015   CHOLHDL 2 04/08/2015   Lab Results  Component Value Date   ALT 16 04/08/2015   AST 21 04/08/2015   ALKPHOS 46 04/08/2015   BILITOT 0.6 04/08/2015

## 2015-04-28 NOTE — Telephone Encounter (Signed)
Mailed to pt

## 2015-05-08 ENCOUNTER — Encounter: Payer: Self-pay | Admitting: Internal Medicine

## 2015-05-08 DIAGNOSIS — E034 Atrophy of thyroid (acquired): Secondary | ICD-10-CM

## 2015-05-09 MED ORDER — LEVOTHYROXINE SODIUM 75 MCG PO TABS: 75.0000 ug | ORAL_TABLET | Freq: Every day | ORAL | Status: DC

## 2015-09-22 DIAGNOSIS — Z23 Encounter for immunization: Secondary | ICD-10-CM | POA: Diagnosis not present

## 2015-10-26 ENCOUNTER — Telehealth: Payer: Self-pay | Admitting: Internal Medicine

## 2015-10-26 ENCOUNTER — Other Ambulatory Visit: Payer: Self-pay

## 2015-10-26 DIAGNOSIS — Z1159 Encounter for screening for other viral diseases: Secondary | ICD-10-CM

## 2015-10-26 DIAGNOSIS — E785 Hyperlipidemia, unspecified: Secondary | ICD-10-CM

## 2015-10-26 DIAGNOSIS — E034 Atrophy of thyroid (acquired): Secondary | ICD-10-CM

## 2015-10-26 DIAGNOSIS — R5383 Other fatigue: Secondary | ICD-10-CM

## 2015-10-26 DIAGNOSIS — E559 Vitamin D deficiency, unspecified: Secondary | ICD-10-CM

## 2015-10-26 MED ORDER — LEVOTHYROXINE SODIUM 75 MCG PO TABS: 75.0000 ug | ORAL_TABLET | Freq: Every day | ORAL | Status: DC

## 2015-10-26 NOTE — Telephone Encounter (Signed)
Synthroid already filled

## 2015-10-26 NOTE — Telephone Encounter (Signed)
Pt would like to get her blood work done before her physical on 11/25/2015. Pt needs another refill for medicationlevothyroxine (SYNTHROID, LEVOTHROID) 75 MCG tablet. Pt will run out before appt. If pt needs to come in no appt avail to sch. Let me know where to sch.  Need order please and thank you!

## 2015-10-27 NOTE — Telephone Encounter (Signed)
No OV needed,  Fasting labs ordered.

## 2015-10-27 NOTE — Telephone Encounter (Signed)
Lab appointment made.

## 2015-11-03 ENCOUNTER — Other Ambulatory Visit: Payer: Self-pay | Admitting: Internal Medicine

## 2015-11-22 ENCOUNTER — Other Ambulatory Visit: Payer: Federal, State, Local not specified - PPO

## 2015-11-23 ENCOUNTER — Other Ambulatory Visit (INDEPENDENT_AMBULATORY_CARE_PROVIDER_SITE_OTHER): Payer: Medicare Other

## 2015-11-23 DIAGNOSIS — E785 Hyperlipidemia, unspecified: Secondary | ICD-10-CM | POA: Diagnosis not present

## 2015-11-23 DIAGNOSIS — E559 Vitamin D deficiency, unspecified: Secondary | ICD-10-CM

## 2015-11-23 DIAGNOSIS — Z1159 Encounter for screening for other viral diseases: Secondary | ICD-10-CM | POA: Diagnosis not present

## 2015-11-23 DIAGNOSIS — E034 Atrophy of thyroid (acquired): Secondary | ICD-10-CM | POA: Diagnosis not present

## 2015-11-23 DIAGNOSIS — E038 Other specified hypothyroidism: Secondary | ICD-10-CM

## 2015-11-23 DIAGNOSIS — Z7289 Other problems related to lifestyle: Secondary | ICD-10-CM | POA: Diagnosis not present

## 2015-11-23 DIAGNOSIS — R5383 Other fatigue: Secondary | ICD-10-CM | POA: Diagnosis not present

## 2015-11-23 LAB — LIPID PANEL
Cholesterol: 190 mg/dL (ref 0–200)
HDL: 71 mg/dL (ref >39.00)
LDL Cholesterol: 109 mg/dL — ABNORMAL HIGH (ref 0–99)
NonHDL: 119.48
Total CHOL/HDL Ratio: 3
Triglycerides: 54 mg/dL (ref 0.0–149.0)
VLDL: 10.8 mg/dL (ref 0.0–40.0)

## 2015-11-23 LAB — COMPREHENSIVE METABOLIC PANEL
ALT: 19 U/L (ref 0–35)
AST: 23 U/L (ref 0–37)
Albumin: 4.1 g/dL (ref 3.5–5.2)
Alkaline Phosphatase: 56 U/L (ref 39–117)
BUN: 18 mg/dL (ref 6–23)
CO2: 29 mEq/L (ref 19–32)
Calcium: 9.5 mg/dL (ref 8.4–10.5)
Chloride: 105 mEq/L (ref 96–112)
Creatinine, Ser: 0.74 mg/dL (ref 0.40–1.20)
GFR: 81.84 mL/min (ref >60.00)
Glucose, Bld: 93 mg/dL (ref 70–99)
Potassium: 4.1 mEq/L (ref 3.5–5.1)
Sodium: 141 mEq/L (ref 135–145)
Total Bilirubin: 0.5 mg/dL (ref 0.2–1.2)
Total Protein: 6.9 g/dL (ref 6.0–8.3)

## 2015-11-23 LAB — CBC WITH DIFFERENTIAL/PLATELET
Basophils Absolute: 0 10*3/uL (ref 0.0–0.1)
Basophils Relative: 0.7 % (ref 0.0–3.0)
Eosinophils Absolute: 0.1 10*3/uL (ref 0.0–0.7)
Eosinophils Relative: 1.5 % (ref 0.0–5.0)
HCT: 38.7 % (ref 36.0–46.0)
Hemoglobin: 12.9 g/dL (ref 12.0–15.0)
Lymphocytes Relative: 31.6 % (ref 12.0–46.0)
Lymphs Abs: 1.8 10*3/uL (ref 0.7–4.0)
MCHC: 33.3 g/dL (ref 30.0–36.0)
MCV: 94.3 fl (ref 78.0–100.0)
Monocytes Absolute: 0.4 10*3/uL (ref 0.1–1.0)
Monocytes Relative: 7.6 % (ref 3.0–12.0)
Neutro Abs: 3.3 10*3/uL (ref 1.4–7.7)
Neutrophils Relative %: 58.6 % (ref 43.0–77.0)
Platelets: 241 10*3/uL (ref 150.0–400.0)
RBC: 4.1 Mil/uL (ref 3.87–5.11)
RDW: 13.4 % (ref 11.5–15.5)
WBC: 5.6 10*3/uL (ref 4.0–10.5)

## 2015-11-23 LAB — TSH: TSH: 0.92 u[IU]/mL (ref 0.35–4.50)

## 2015-11-23 LAB — VITAMIN D 25 HYDROXY (VIT D DEFICIENCY, FRACTURES): VITD: 35.02 ng/mL (ref 30.00–100.00)

## 2015-11-24 LAB — HEPATITIS C ANTIBODY: HCV Ab: NEGATIVE

## 2015-11-25 ENCOUNTER — Ambulatory Visit (INDEPENDENT_AMBULATORY_CARE_PROVIDER_SITE_OTHER): Payer: Medicare Other | Admitting: Internal Medicine

## 2015-11-25 ENCOUNTER — Encounter: Payer: Self-pay | Admitting: Internal Medicine

## 2015-11-25 VITALS — BP 136/82 | HR 70 | Temp 98.1°F | Resp 12 | Ht 62.5 in | Wt 125.0 lb

## 2015-11-25 DIAGNOSIS — Z23 Encounter for immunization: Secondary | ICD-10-CM

## 2015-11-25 DIAGNOSIS — Z0000 Encounter for general adult medical examination without abnormal findings: Secondary | ICD-10-CM | POA: Diagnosis not present

## 2015-11-25 DIAGNOSIS — H01005 Unspecified blepharitis left lower eyelid: Secondary | ICD-10-CM

## 2015-11-25 DIAGNOSIS — E038 Other specified hypothyroidism: Secondary | ICD-10-CM | POA: Diagnosis not present

## 2015-11-25 DIAGNOSIS — I6529 Occlusion and stenosis of unspecified carotid artery: Secondary | ICD-10-CM | POA: Diagnosis not present

## 2015-11-25 DIAGNOSIS — E034 Atrophy of thyroid (acquired): Secondary | ICD-10-CM | POA: Diagnosis not present

## 2015-11-25 DIAGNOSIS — Z1239 Encounter for other screening for malignant neoplasm of breast: Secondary | ICD-10-CM | POA: Diagnosis not present

## 2015-11-25 MED ORDER — TOBRAMYCIN 0.3 % OP SOLN: 2.0000 [drp] | Freq: Four times a day (QID) | OPHTHALMIC | Status: DC

## 2015-11-25 NOTE — Progress Notes (Signed)
Patient ID: Raven [name], female    DOB: 1941-10-09  Age: 74 y.o. MRN: [id number]  The patient is here for annual  wellness examination and management of other chronic and acute problems.  Last mammogram   2015  Ext dense breasts  Parathyroid biopsy 2014.  Last colonoscopy 2014 [facility] PAP normal 2014 .   DEXA 2015  Eye exam in January   The risk factors are reflected in the social history.  The roster of all physicians providing medical care to patient - is listed in the Snapshot section of the chart.  Activities of daily living:  The patient is 100% independent in all ADLs: dressing, toileting, feeding as well as independent mobility  Home safety : The patient has smoke detectors in the home. They wear seatbelts.  There are no firearms at home. There is no violence in the home.   There is no risks for hepatitis, STDs or HIV. There is no   history of blood transfusion. They have no travel history to infectious disease endemic areas of the world.  The patient has seen their dentist in the last six month. They have seen their eye doctor in the last year. They admit to slight hearing difficulty with regard to whispered voices and some television programs.  They have deferred audiologic testing in the last year.  They do not  have excessive sun exposure. Discussed the need for sun protection: hats, long sleeves and use of sunscreen if there is significant sun exposure.   Diet: the importance of a healthy diet is discussed. They do have a healthy diet.  The benefits of regular aerobic exercise were discussed. She walks 4 times per week ,  20 minutes.   Depression screen: there are no signs or vegative symptoms of depression- irritability, change in appetite, anhedonia, sadness/tearfullness.  Cognitive assessment: the patient manages all their financial and personal affairs and is actively engaged. They could relate day,date,year and events; recalled 2/3 objects at 3 minutes; performed  clock-face test normally.  The following portions of the patient's history were reviewed and updated as appropriate: allergies, current medications, past family history, past medical history,  past surgical history, past social history  and problem list.  Visual acuity was not assessed per patient preference since she has regular follow up with her ophthalmologist. Hearing and body mass index were assessed and reviewed.   During the course of the visit the patient was educated and counseled about appropriate screening and preventive services including : fall prevention , diabetes screening, nutrition counseling, colorectal cancer screening, and recommended immunizations.    CC: The primary encounter diagnosis was Breast cancer screening. Diagnoses of Blepharitis of left lower eyelid, Need for prophylactic vaccination against Streptococcus pneumoniae (pneumococcus), Hypercalcemia, Hypothyroidism due to acquired atrophy of thyroid, and Medicare annual wellness visit, subsequent were also pertinent to this visit.   Left eye issues. Developed a swelling on the outer corning of lower eyelid  Several weeks ago,  Which resolved,  But recurred last week on the inner corner of the same eyelid.  Seeing eye doctor next week.   Recent fall  Occurred on Dec 1.  While descending the steps from her friend's house . Struck her right temple and right side of upper body on concrete steps.  Did not seek medical attention, since she did not have a headache,  Saw nurse at [facility] who advised follow up with PCP.  Waited until now.   All tenderness gone.  Bruises to right  side of face, black eye.  Right 5th finger,  Right breast /rib  Had a large goose on left temple, resolving,  Black eye  Some anal leakage with flatus  Notices it when she wipes. .  Has been occurring since the summer. Not daily  More often when bowels movements are loose ,  Related to diet.   History Raven [name] has a past medical history of  Hyperlipidemia; Fracture (Sep 11 2008); Thyroid disease; Hypothyroidism; Headache(784.0); Arthritis; Idiopathic parathyroidism ([place]); Primary hyperparathyroidism ([place]); and Carotid artery occlusion.   She has past surgical history that includes Carpal tunnel release (2000); Cesarean section; Rotator cuff repair (2013); and Parathyroidectomy (Left, 03/23/2014).   Her family history includes Alzheimer's disease in her mother; Arthritis in her other; Dementia in her maternal grandmother and mother; Heart attack in her father and mother; Heart disease in her father; Heart failure in her father.She reports that she has never smoked. She has never used smokeless tobacco. She reports that she drinks about 4.2 oz of alcohol per week. She reports that she does not use illicit drugs.  Outpatient Prescriptions Prior to Visit  Medication Sig Dispense Refill  . aspirin-acetaminophen-caffeine (EXCEDRIN MIGRAINE) 250-250-65 MG per tablet Take 1 tablet by mouth every 6 (six) hours as needed for headache.    . calcium-vitamin D (OSCAL WITH D) 500-200 MG-UNIT per tablet Take 1 tablet by mouth daily with breakfast.    . ibuprofen (ADVIL,MOTRIN) 200 MG tablet Take 200-400 mg by mouth every 6 (six) hours as needed for mild pain or moderate pain.    Marland Kitchen levothyroxine (SYNTHROID, LEVOTHROID) 75 MCG tablet TAKE 1 TABLET (75 MCG TOTAL) BY MOUTH DAILY BEFORE BREAKFAST. 90 tablet 1  . Multiple Vitamin (MULTIVITAMIN WITH MINERALS) TABS tablet Take 1 tablet by mouth daily.    . simvastatin (ZOCOR) 40 MG tablet Take 1 tablet (40 mg total) by mouth at bedtime. 60 tablet 6  . levothyroxine (SYNTHROID, LEVOTHROID) 75 MCG tablet Take 1 tablet (75 mcg total) by mouth daily before breakfast. 90 tablet 0  . levofloxacin (LEVAQUIN) 500 MG tablet Take 1 tablet (500 mg total) by mouth daily. (Patient not taking: Reported on 11/25/2015) 7 tablet 0   No facility-administered medications prior to visit.    Review of Systems   Patient  denies headache, fevers, malaise, unintentional weight loss, skin rash, eye pain, sinus congestion and sinus pain, sore throat, dysphagia,  hemoptysis , cough, dyspnea, wheezing, chest pain, palpitations, orthopnea, edema, abdominal pain, nausea, melena, diarrhea, constipation, flank pain, dysuria, hematuria, urinary  Frequency, nocturia, numbness, tingling, seizures,  Focal weakness, Loss of consciousness,  Tremor, insomnia, depression, anxiety, and suicidal ideation.      Objective:  BP 136/82 mmHg  Pulse 70  Temp(Src) 98.1 F (36.7 C) (Oral)  Resp 12  Ht 5' 2.5" (1.588 m)  Wt 125 lb (56.7 kg)  BMI 22.48 kg/m2  SpO2 97%  Physical Exam  General appearance: alert, cooperative and appears stated age Head: Normocephalic, without obvious abnormality, atraumatic Eyes: conjunctivae/corneas clear. PERRL, EOM's intact. Fundi benign. Ears: normal TM's and external ear canals both ears Nose: Nares normal. Septum midline. Mucosa normal. No drainage or sinus tenderness. Throat: lips, mucosa, and tongue normal; teeth and gums normal Neck: no adenopathy, no carotid bruit, no JVD, supple, symmetrical, trachea midline and thyroid not enlarged, symmetric, no tenderness/mass/nodules Lungs: clear to auscultation bilaterally Breasts: normal appearance, no masses or tenderness Heart: regular rate and rhythm, S1, S2 normal, no murmur, click, rub or gallop Abdomen: soft, non-tender;  bowel sounds normal; no masses,  no organomegaly Extremities: extremities normal, atraumatic, no cyanosis or edema Pulses: 2+ and symmetric Skin: Skin color, texture, turgor normal. No rashes or lesions Neurologic: Alert and oriented X 3, normal strength and tone. Normal symmetric reflexes. Normal coordination and gait.   Assessment & Plan:   Problem List Items Addressed This Visit    Medicare annual wellness visit, subsequent    Annual comprehensive preventive exam was done as well as an evaluation and management of  chronic conditions .  During the course of the visit the patient was educated and counseled about appropriate screening and preventive services including :  diabetes screening, lipid analysis with projected  10 year  risk for CAD , nutrition counseling, colorectal cancer screening, and recommended immunizations.  Printed recommendations for health maintenance screenings was given.       Hypercalcemia    Secondary to primary hyperparathyroidism, now resolved post parathyroidectomy May 2015        Hypothyroidism    Thyroid function is WNL on current dose.  No current changes needed.   Lab Results  Component Value Date   TSH 0.92 11/23/2015   .        Blepharitis of left lower eyelid    Topical antibiotic prescribed. She will follow up with her ophthalmologist next week.        Other Visit Diagnoses    Breast cancer screening    -  Primary    Relevant Orders    MM DIGITAL SCREENING BILATERAL    Need for prophylactic vaccination against Streptococcus pneumoniae (pneumococcus)        Relevant Orders    Pneumococcal polysaccharide vaccine 23-valent greater than or equal to 2yo subcutaneous/IM (Completed)       I have discontinued Ms. Syx levofloxacin. I am also having her start on tobramycin. Additionally, I am having her maintain her calcium-vitamin D, multivitamin with minerals, aspirin-acetaminophen-caffeine, ibuprofen, simvastatin, levothyroxine, Biotin, and folic acid.  Meds ordered this encounter  Medications  . Biotin 2500 MCG CAPS    Sig: Take by mouth.  . folic acid (FOLVITE) [id number] MCG tablet    Sig: Take 400 mcg by mouth daily.  Marland Kitchen tobramycin (TOBREX) 0.3 % ophthalmic solution    Sig: Place 2 drops into the left eye every 6 (six) hours.    Dispense:  5 mL    Refill:  0    Medications Discontinued During This Encounter  Medication Reason  . levofloxacin (LEVAQUIN) 500 MG tablet Completed Course  . levothyroxine (SYNTHROID, LEVOTHROID) 75 MCG tablet Completed  Course    Follow-up: Return in about 6 months (around 05/25/2016).   Crecencio Mc, MD

## 2015-11-25 NOTE — Patient Instructions (Signed)
I have ordered your mammogram  Continue 2000 IUs of D3 daily   Menopause is a normal process in which your reproductive ability comes to an end. This process happens gradually over a span of months to years, usually between the ages of 32 and 38. Menopause is complete when you have missed 12 consecutive menstrual periods. It is important to talk with your health care provider about some of the most common conditions that affect postmenopausal women, such as heart disease, cancer, and bone loss (osteoporosis). Adopting a healthy lifestyle and getting preventive care can help to promote your health and wellness. Those actions can also lower your chances of developing some of these common conditions. WHAT SHOULD I KNOW ABOUT MENOPAUSE? During menopause, you may experience a number of symptoms, such as:  Moderate-to-severe hot flashes.  Night sweats.  Decrease in sex drive.  Mood swings.  Headaches.  Tiredness.  Irritability.  Memory problems.  Insomnia. Choosing to treat or not to treat menopausal changes is an individual decision that you make with your health care provider. WHAT SHOULD I KNOW ABOUT HORMONE REPLACEMENT THERAPY AND SUPPLEMENTS? Hormone therapy products are effective for treating symptoms that are associated with menopause, such as hot flashes and night sweats. Hormone replacement carries certain risks, especially as you become older. If you are thinking about using estrogen or estrogen with progestin treatments, discuss the benefits and risks with your health care provider. WHAT SHOULD I KNOW ABOUT HEART DISEASE AND STROKE? Heart disease, heart attack, and stroke become more likely as you age. This may be due, in part, to the hormonal changes that your body experiences during menopause. These can affect how your body processes dietary fats, triglycerides, and cholesterol. Heart attack and stroke are both medical emergencies. There are many things that you can do to help  prevent heart disease and stroke:  Have your blood pressure checked at least every 1-2 years. High blood pressure causes heart disease and increases the risk of stroke.  If you are 23-11 years old, ask your health care provider if you should take aspirin to prevent a heart attack or a stroke.  Do not use any tobacco products, including cigarettes, chewing tobacco, or electronic cigarettes. If you need help quitting, ask your health care provider.  It is important to eat a healthy diet and maintain a healthy weight.  Be sure to include plenty of vegetables, fruits, low-fat dairy products, and lean protein.  Avoid eating foods that are high in solid fats, added sugars, or salt (sodium).  Get regular exercise. This is one of the most important things that you can do for your health.  Try to exercise for at least 150 minutes each week. The type of exercise that you do should increase your heart rate and make you sweat. This is known as moderate-intensity exercise.  Try to do strengthening exercises at least twice each week. Do these in addition to the moderate-intensity exercise.  Know your numbers.Ask your health care provider to check your cholesterol and your blood glucose. Continue to have your blood tested as directed by your health care provider. WHAT SHOULD I KNOW ABOUT CANCER SCREENING? There are several types of cancer. Take the following steps to reduce your risk and to catch any cancer development as early as possible. Breast Cancer  Practice breast self-awareness.  This means understanding how your breasts normally appear and feel.  It also means doing regular breast self-exams. Let your health care provider know about any changes, no matter  how small.  If you are 71 or older, have a clinician do a breast exam (clinical breast exam or CBE) every year. Depending on your age, family history, and medical history, it may be recommended that you also have a yearly breast X-ray  (mammogram).  If you have a family history of breast cancer, talk with your health care provider about genetic screening.  If you are at high risk for breast cancer, talk with your health care provider about having an MRI and a mammogram every year.  Breast cancer (BRCA) gene test is recommended for women who have family members with BRCA-related cancers. Results of the assessment will determine the need for genetic counseling and BRCA1 and for BRCA2 testing. BRCA-related cancers include these types:  Breast. This occurs in males or females.  Ovarian.  Tubal. This may also be called fallopian tube cancer.  Cancer of the abdominal or pelvic lining (peritoneal cancer).  Prostate.  Pancreatic. Cervical, Uterine, and Ovarian Cancer Your health care provider may recommend that you be screened regularly for cancer of the pelvic organs. These include your ovaries, uterus, and vagina. This screening involves a pelvic exam, which includes checking for microscopic changes to the surface of your cervix (Pap test).  For women ages 21-65, health care providers may recommend a pelvic exam and a Pap test every three years. For women ages 25-65, they may recommend the Pap test and pelvic exam, combined with testing for human papilloma virus (HPV), every five years. Some types of HPV increase your risk of cervical cancer. Testing for HPV may also be done on women of any age who have unclear Pap test results.  Other health care providers may not recommend any screening for nonpregnant women who are considered low risk for pelvic cancer and have no symptoms. Ask your health care provider if a screening pelvic exam is right for you.  If you have had past treatment for cervical cancer or a condition that could lead to cancer, you need Pap tests and screening for cancer for at least 20 years after your treatment. If Pap tests have been discontinued for you, your risk factors (such as having a new sexual partner)  need to be reassessed to determine if you should start having screenings again. Some women have medical problems that increase the chance of getting cervical cancer. In these cases, your health care provider may recommend that you have screening and Pap tests more often.  If you have a family history of uterine cancer or ovarian cancer, talk with your health care provider about genetic screening.  If you have vaginal bleeding after reaching menopause, tell your health care provider.  There are currently no reliable tests available to screen for ovarian cancer. Lung Cancer Lung cancer screening is recommended for adults 12-51 years old who are at high risk for lung cancer because of a history of smoking. A yearly low-dose CT scan of the lungs is recommended if you:  Currently smoke.  Have a history of at least 30 pack-years of smoking and you currently smoke or have quit within the past 15 years. A pack-year is smoking an average of one pack of cigarettes per day for one year. Yearly screening should:  Continue until it has been 15 years since you quit.  Stop if you develop a health problem that would prevent you from having lung cancer treatment. Colorectal Cancer  This type of cancer can be detected and can often be prevented.  Routine colorectal cancer screening  usually begins at age 39 and continues through age 55.  If you have risk factors for colon cancer, your health care provider may recommend that you be screened at an earlier age.  If you have a family history of colorectal cancer, talk with your health care provider about genetic screening.  Your health care provider may also recommend using home test kits to check for hidden blood in your stool.  A small camera at the end of a tube can be used to examine your colon directly (sigmoidoscopy or colonoscopy). This is done to check for the earliest forms of colorectal cancer.  Direct examination of the colon should be repeated  every 5-10 years until age 63. However, if early forms of precancerous polyps or small growths are found or if you have a family history or genetic risk for colorectal cancer, you may need to be screened more often. Skin Cancer  Check your skin from head to toe regularly.  Monitor any moles. Be sure to tell your health care provider:  About any new moles or changes in moles, especially if there is a change in a mole's shape or color.  If you have a mole that is larger than the size of a pencil eraser.  If any of your family members has a history of skin cancer, especially at a young age, talk with your health care provider about genetic screening.  Always use sunscreen. Apply sunscreen liberally and repeatedly throughout the day.  Whenever you are outside, protect yourself by wearing long sleeves, pants, a wide-brimmed hat, and sunglasses. WHAT SHOULD I KNOW ABOUT OSTEOPOROSIS? Osteoporosis is a condition in which bone destruction happens more quickly than new bone creation. After menopause, you may be at an increased risk for osteoporosis. To help prevent osteoporosis or the bone fractures that can happen because of osteoporosis, the following is recommended:  If you are 39-35 years old, get at least 1,000 mg of calcium and at least 600 mg of vitamin D per day.  If you are older than age 63 but younger than age 84, get at least 1,200 mg of calcium and at least 600 mg of vitamin D per day.  If you are older than age 51, get at least 1,200 mg of calcium and at least 800 mg of vitamin D per day. Smoking and excessive alcohol intake increase the risk of osteoporosis. Eat foods that are rich in calcium and vitamin D, and do weight-bearing exercises several times each week as directed by your health care provider. WHAT SHOULD I KNOW ABOUT HOW MENOPAUSE AFFECTS [place]? Depression may occur at any age, but it is more common as you become older. Common symptoms of depression  include:  Low or sad mood.  Changes in sleep patterns.  Changes in appetite or eating patterns.  Feeling an overall lack of motivation or enjoyment of activities that you previously enjoyed.  Frequent crying spells. Talk with your health care provider if you think that you are experiencing depression. WHAT SHOULD I KNOW ABOUT IMMUNIZATIONS? It is important that you get and maintain your immunizations. These include:  Tetanus, diphtheria, and pertussis (Tdap) booster vaccine.  Influenza every year before the flu season begins.  Pneumonia vaccine.  Shingles vaccine. Your health care provider may also recommend other immunizations.   This information is not intended to replace advice given to you by your health care provider. Make sure you discuss any questions you have with your health care provider.   Document  Released: 01/04/2006 Document Revised: 12/03/2014 Document Reviewed: 07/15/2014 Elsevier Interactive Patient Education [employer].

## 2015-11-25 NOTE — Progress Notes (Signed)
Pre visit review using our clinic review tool, if applicable. No additional management support is needed unless otherwise documented below in the visit note. 

## 2015-11-27 NOTE — Assessment & Plan Note (Signed)
Annual comprehensive preventive exam was done as well as an evaluation and management of chronic conditions .  During the course of the visit the patient was educated and counseled about appropriate screening and preventive services including :  diabetes screening, lipid analysis with projected  10 year  risk for CAD , nutrition counseling, colorectal cancer screening, and recommended immunizations.  Printed recommendations for health maintenance screenings was given.   

## 2015-11-27 NOTE — Assessment & Plan Note (Signed)
Topical antibiotic prescribed. She will follow up with her ophthalmologist next week.

## 2015-11-27 NOTE — Assessment & Plan Note (Signed)
Secondary to primary hyperparathyroidism, now resolved post parathyroidectomy May 2015 

## 2015-11-27 NOTE — Assessment & Plan Note (Signed)
Thyroid function is WNL on current dose.  No current changes needed.   Lab Results  Component Value Date   TSH 0.92 11/23/2015   .

## 2015-11-28 ENCOUNTER — Encounter: Payer: Self-pay | Admitting: Internal Medicine

## 2015-12-23 ENCOUNTER — Other Ambulatory Visit: Payer: Self-pay | Admitting: *Deleted

## 2015-12-23 MED ORDER — SIMVASTATIN 40 MG PO TABS: 40.0000 mg | ORAL_TABLET | Freq: Every day | ORAL | Status: DC

## 2015-12-27 ENCOUNTER — Ambulatory Visit
Admission: RE | Admit: 2015-12-27 | Discharge: 2015-12-27 | Disposition: A | Discharge status: Home / Self Care | Payer: Federal, State, Local not specified - PPO | Source: Ambulatory Visit | Attending: Internal Medicine | Admitting: Internal Medicine

## 2015-12-27 DIAGNOSIS — Z1239 Encounter for other screening for malignant neoplasm of breast: Secondary | ICD-10-CM

## 2015-12-28 ENCOUNTER — Ambulatory Visit
Admission: RE | Admit: 2015-12-28 | Discharge: 2015-12-28 | Disposition: A | Discharge status: Home / Self Care | Payer: Medicare Other | Source: Ambulatory Visit | Attending: Internal Medicine | Admitting: Internal Medicine

## 2015-12-28 ENCOUNTER — Other Ambulatory Visit: Payer: Self-pay | Admitting: Internal Medicine

## 2015-12-28 DIAGNOSIS — Z1231 Encounter for screening mammogram for malignant neoplasm of breast: Secondary | ICD-10-CM

## 2015-12-28 DIAGNOSIS — Z1239 Encounter for other screening for malignant neoplasm of breast: Secondary | ICD-10-CM

## 2015-12-29 DIAGNOSIS — D225 Melanocytic nevi of trunk: Secondary | ICD-10-CM | POA: Diagnosis not present

## 2015-12-29 DIAGNOSIS — L812 Freckles: Secondary | ICD-10-CM | POA: Diagnosis not present

## 2015-12-29 DIAGNOSIS — L570 Actinic keratosis: Secondary | ICD-10-CM | POA: Diagnosis not present

## 2015-12-29 DIAGNOSIS — D485 Neoplasm of uncertain behavior of skin: Secondary | ICD-10-CM | POA: Diagnosis not present

## 2015-12-29 DIAGNOSIS — L820 Inflamed seborrheic keratosis: Secondary | ICD-10-CM | POA: Diagnosis not present

## 2015-12-29 DIAGNOSIS — L821 Other seborrheic keratosis: Secondary | ICD-10-CM | POA: Diagnosis not present

## 2015-12-29 DIAGNOSIS — L578 Other skin changes due to chronic exposure to nonionizing radiation: Secondary | ICD-10-CM | POA: Diagnosis not present

## 2016-01-15 DIAGNOSIS — H9222 Otorrhagia, left ear: Secondary | ICD-10-CM | POA: Diagnosis not present

## 2016-01-17 DIAGNOSIS — H9222 Otorrhagia, left ear: Secondary | ICD-10-CM | POA: Diagnosis not present

## 2016-01-17 DIAGNOSIS — H60332 Swimmer's ear, left ear: Secondary | ICD-10-CM | POA: Diagnosis not present

## 2016-01-23 ENCOUNTER — Other Ambulatory Visit: Payer: Self-pay | Admitting: Internal Medicine

## 2016-02-17 ENCOUNTER — Encounter: Payer: Self-pay | Admitting: Internal Medicine

## 2016-02-21 ENCOUNTER — Encounter: Payer: Self-pay | Admitting: Internal Medicine

## 2016-02-24 ENCOUNTER — Ambulatory Visit (INDEPENDENT_AMBULATORY_CARE_PROVIDER_SITE_OTHER): Payer: Medicare Other | Admitting: Family Medicine

## 2016-02-24 ENCOUNTER — Encounter: Payer: Self-pay | Admitting: Family Medicine

## 2016-02-24 VITALS — BP 118/66 | HR 71 | Temp 98.1°F | Ht 62.5 in | Wt 124.6 lb

## 2016-02-24 DIAGNOSIS — M546 Pain in thoracic spine: Secondary | ICD-10-CM | POA: Insufficient documentation

## 2016-02-24 DIAGNOSIS — S29012A Strain of muscle and tendon of back wall of thorax, initial encounter: Secondary | ICD-10-CM

## 2016-02-24 MED ORDER — BACLOFEN 10 MG PO TABS: 5.0000 mg | ORAL_TABLET | Freq: Three times a day (TID) | ORAL | Status: DC

## 2016-02-24 NOTE — Progress Notes (Signed)
Pre visit review using our clinic review tool, if applicable. No additional management support is needed unless otherwise documented below in the visit note. 

## 2016-02-24 NOTE — Assessment & Plan Note (Signed)
Patient complains of neck discomfort though when pointing to the location of her pain it appears that the origin is in her thoracic musculature. Suspect spasm and radiation up to her head leading to her headaches. 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 She is neurologically intact. She does have spasm on exam. Discussed possible treatments including anti-inflammatories which she is v already ery taking and a muscle relaxer. Also discussed physical therapy as she was interested in this. A physical therapy referral was placed. We will treat her with baclofen as a muscle relaxer given that it does not fall under the beers criteria. Advised that this could make her drowsy. She'll continue to monitor. If not improving with physical therapy and muscle relaxer she will let us know. Given return precautions.

## 2016-02-24 NOTE — Progress Notes (Signed)
Patient ID: KAZLYN KARAU, female   DOB: 1941/03/12, 75 y.o.   MRN: [id number]  Tommi Rumps, MD Phone: [phone]  Raven [name] is a 75 y.o. female who presents today for same-day visit.  Patient presents today with complaint of left neck pain, though when she points to the area that bothers her it is her left upper back. She notes the pain comes and goes and can be nagging or shooting. Notes it can occasionally radiate up to her head and cause headache that is consistent with her prior headaches though the origin is not consistent with her prior headaches. Notes it is exacerbated after she goes to the gym and works out. Notes if she does not go to the gym to workout it is less prevalent. She denies numbness and weakness and vision changes with this. No radiation down her arm. No fevers, saddle anesthesia, loss of bowel or bladder function, or history of cancer. She denies any specific injury. She's been taking Aleve and Excedrin these have been beneficial. Has not gotten significantly worse recently though has not improved as she would expect.  PMH: nonsmoker.   ROS see history of present illness  Objective  Physical Exam Filed Vitals:   02/24/16 1500  BP: 118/66  Pulse: 71  Temp: 98.1 F (36.7 C)    BP Readings from Last 3 Encounters:  02/24/16 118/66  11/25/15 136/82  04/08/15 112/70   Wt Readings from Last 3 Encounters:  02/24/16 124 lb 9.6 oz (56.518 kg)  11/25/15 125 lb (56.7 kg)  04/08/15 124 lb 8 oz (56.473 kg)    Physical Exam  Constitutional: She is well-developed, well-nourished, and in no distress.  HENT:  Head: Normocephalic and atraumatic.  Mouth/Throat: Oropharynx is clear and moist.  Eyes: Conjunctivae are normal. Pupils are equal, round, and reactive to light.  Cardiovascular: Normal rate, regular rhythm and normal heart sounds.   Pulmonary/Chest: Effort normal and breath sounds normal.  Musculoskeletal:  No midline spine or neck tenderness, no  midline spine or neck step-off, there is left upper thoracic paraspinous muscle tenderness and spasm and rhomboid tenderness and spasm, full range of motion of neck with no discomfort, negative Spurling's  Neurological: She is alert.  CN 2-12 intact, 5/5 strength in bilateral biceps, triceps, grip, quads, hamstrings, plantar and dorsiflexion, sensation to light touch intact in bilateral UE and LE, normal gait, 2+ patellar reflexes  Skin: Skin is warm and dry.     Assessment/Plan: Please see individual problem list.  Thoracic back pain Patient complains of neck discomfort though when pointing to the location of her pain it appears that the origin is in her thoracic musculature. Suspect spasm and radiation up to her head leading to her headaches. 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 She is neurologically intact. She does have spasm on exam. Discussed possible treatments including anti-inflammatories which she is v already ery taking and a muscle relaxer. Also discussed physical therapy as she was interested in this. A physical therapy referral was placed. We will treat her with baclofen as a muscle relaxer given that it does not fall under the beers criteria. Advised that this could make her drowsy. She'll continue to monitor. If not improving with physical therapy and muscle relaxer she will let us know. Given return precautions.    Orders Placed This Encounter  Procedures  . Ambulatory referral to Physical Therapy    Referral Priority:  Routine    Referral Type:  Physical Medicine    Referral Reason:  Specialty Services Required    Requested Specialty:  Physical Therapy    Number of Visits Requested:  1    Meds ordered this encounter  Medications  . baclofen (LIORESAL) 10 MG tablet    Sig: Take 0.5 tablets (5 mg total) by mouth 3 (three) times daily.    Dispense:  20 each    Refill:  0    Tommi Rumps, MD [place]

## 2016-02-24 NOTE — Patient Instructions (Signed)
Nice to meet you. You likely strained a muscle in your upper back and low neck. We will treat this with a muscle relaxer called baclofen. Please trial this at night as it may make you drowsy. You can continue Aleve and Excedrin as needed. If you develop numbness, weakness, vision changes, worsening headaches, or any new or changing symptoms please seek medical attention.

## 2016-02-29 DIAGNOSIS — M6281 Muscle weakness (generalized): Secondary | ICD-10-CM | POA: Diagnosis not present

## 2016-02-29 DIAGNOSIS — M542 Cervicalgia: Secondary | ICD-10-CM | POA: Diagnosis not present

## 2016-03-02 DIAGNOSIS — M542 Cervicalgia: Secondary | ICD-10-CM | POA: Diagnosis not present

## 2016-03-02 DIAGNOSIS — M6281 Muscle weakness (generalized): Secondary | ICD-10-CM | POA: Diagnosis not present

## 2016-03-05 DIAGNOSIS — M6281 Muscle weakness (generalized): Secondary | ICD-10-CM | POA: Diagnosis not present

## 2016-03-05 DIAGNOSIS — M542 Cervicalgia: Secondary | ICD-10-CM | POA: Diagnosis not present

## 2016-03-07 DIAGNOSIS — M542 Cervicalgia: Secondary | ICD-10-CM | POA: Diagnosis not present

## 2016-03-07 DIAGNOSIS — M6281 Muscle weakness (generalized): Secondary | ICD-10-CM | POA: Diagnosis not present

## 2016-03-09 DIAGNOSIS — M6281 Muscle weakness (generalized): Secondary | ICD-10-CM | POA: Diagnosis not present

## 2016-03-09 DIAGNOSIS — M542 Cervicalgia: Secondary | ICD-10-CM | POA: Diagnosis not present

## 2016-03-12 DIAGNOSIS — M542 Cervicalgia: Secondary | ICD-10-CM | POA: Diagnosis not present

## 2016-03-12 DIAGNOSIS — M6281 Muscle weakness (generalized): Secondary | ICD-10-CM | POA: Diagnosis not present

## 2016-03-13 ENCOUNTER — Telehealth: Payer: Self-pay | Admitting: Cardiovascular Disease

## 2016-03-13 NOTE — Telephone Encounter (Signed)
lmom to call our office to schedule a Carotid u/s (1 yr f/u).

## 2016-03-14 DIAGNOSIS — M6281 Muscle weakness (generalized): Secondary | ICD-10-CM | POA: Diagnosis not present

## 2016-03-14 DIAGNOSIS — M542 Cervicalgia: Secondary | ICD-10-CM | POA: Diagnosis not present

## 2016-03-16 DIAGNOSIS — M542 Cervicalgia: Secondary | ICD-10-CM | POA: Diagnosis not present

## 2016-03-16 DIAGNOSIS — M6281 Muscle weakness (generalized): Secondary | ICD-10-CM | POA: Diagnosis not present

## 2016-03-19 DIAGNOSIS — M542 Cervicalgia: Secondary | ICD-10-CM | POA: Diagnosis not present

## 2016-03-19 DIAGNOSIS — M6281 Muscle weakness (generalized): Secondary | ICD-10-CM | POA: Diagnosis not present

## 2016-03-21 DIAGNOSIS — M542 Cervicalgia: Secondary | ICD-10-CM | POA: Diagnosis not present

## 2016-03-21 DIAGNOSIS — M6281 Muscle weakness (generalized): Secondary | ICD-10-CM | POA: Diagnosis not present

## 2016-03-22 DIAGNOSIS — M542 Cervicalgia: Secondary | ICD-10-CM | POA: Diagnosis not present

## 2016-03-22 DIAGNOSIS — M6281 Muscle weakness (generalized): Secondary | ICD-10-CM | POA: Diagnosis not present

## 2016-03-30 ENCOUNTER — Encounter: Payer: Self-pay | Admitting: Internal Medicine

## 2016-03-30 ENCOUNTER — Ambulatory Visit (INDEPENDENT_AMBULATORY_CARE_PROVIDER_SITE_OTHER): Payer: Medicare Other | Admitting: Internal Medicine

## 2016-03-30 VITALS — BP 130/78 | HR 73 | Temp 98.2°F | Resp 12 | Ht 62.5 in | Wt 124.5 lb

## 2016-03-30 DIAGNOSIS — E210 Primary hyperparathyroidism: Secondary | ICD-10-CM

## 2016-03-30 DIAGNOSIS — I6523 Occlusion and stenosis of bilateral carotid arteries: Secondary | ICD-10-CM | POA: Diagnosis not present

## 2016-03-30 DIAGNOSIS — E038 Other specified hypothyroidism: Secondary | ICD-10-CM | POA: Diagnosis not present

## 2016-03-30 DIAGNOSIS — M546 Pain in thoracic spine: Secondary | ICD-10-CM

## 2016-03-30 DIAGNOSIS — E785 Hyperlipidemia, unspecified: Secondary | ICD-10-CM | POA: Diagnosis not present

## 2016-03-30 DIAGNOSIS — T148 Other injury of unspecified body region: Secondary | ICD-10-CM

## 2016-03-30 DIAGNOSIS — E034 Atrophy of thyroid (acquired): Secondary | ICD-10-CM

## 2016-03-30 DIAGNOSIS — E559 Vitamin D deficiency, unspecified: Secondary | ICD-10-CM

## 2016-03-30 DIAGNOSIS — T148XXA Other injury of unspecified body region, initial encounter: Secondary | ICD-10-CM

## 2016-03-30 NOTE — Progress Notes (Signed)
Subjective:  Patient ID: Raven [name], female    DOB: 1941-05-05  Age: 75 y.o. MRN: [id number]  CC: The primary encounter diagnosis was Hyperlipidemia. Diagnoses of Hypercalcemia, Hypothyroidism due to acquired atrophy of thyroid, Bone bruise, Vitamin D deficiency, Midline thoracic back pain, Carotid stenosis, bilateral, and Hyperparathyroidism, primary ([place]) were also pertinent to this visit.  HPI Raven [name] presents for 6 MONTH FOLLOW U P  ON hypothyroidism and hyperlipidemia  She was treated in late march for THORACIC /CERVICAL NECK MUSCLE SPASM.  Symptoms resolved with PT<  Manipulation and massage .  Episode was aggravated by yard work .  ROM has improved and there is no radaition to either ar   Saw Arida last year for cholesterol emboli  .  Has not followed up with him yet for carotid ultrasound. She is taking exedrin migraine, which has aspirin in it, about twice weekly, and taking simvastatin 40 mg daily for goal LDL < 100 per [facility]    Outpatient Prescriptions Prior to Visit  Medication Sig Dispense Refill  . aspirin-acetaminophen-caffeine (EXCEDRIN MIGRAINE) 250-250-65 MG per tablet Take 1 tablet by mouth every 6 (six) hours as needed for headache.    . Biotin 2500 MCG CAPS Take by mouth.    . calcium-vitamin D (OSCAL WITH D) 500-200 MG-UNIT per tablet Take 1 tablet by mouth daily with breakfast.    . folic acid (FOLVITE) A999333 MCG tablet Take 400 mcg by mouth daily.    Marland Kitchen ibuprofen (ADVIL,MOTRIN) 200 MG tablet Take 200-400 mg by mouth every 6 (six) hours as needed for mild pain or moderate pain.    Marland Kitchen levothyroxine (SYNTHROID, LEVOTHROID) 75 MCG tablet TAKE 1 TABLET (75 MCG TOTAL) BY MOUTH DAILY BEFORE BREAKFAST. 90 tablet 1  . Multiple Vitamin (MULTIVITAMIN WITH MINERALS) TABS tablet Take 1 tablet by mouth daily.    . simvastatin (ZOCOR) 40 MG tablet Take 1 tablet (40 mg total) by mouth at bedtime. 60 tablet 3  . baclofen (LIORESAL) 10 MG tablet Take 0.5 tablets (5 mg total) by  mouth 3 (three) times daily. (Patient not taking: Reported on 03/30/2016) 20 each 0  . levothyroxine (SYNTHROID, LEVOTHROID) 75 MCG tablet TAKE 1 TABLET BY MOUTH DAILY BEFORE BREAKFAST 90 tablet 1  . tobramycin (TOBREX) 0.3 % ophthalmic solution Place 2 drops into the left eye every 6 (six) hours. 5 mL 0   No facility-administered medications prior to visit.    Review of Systems;  Patient denies headache, fevers, malaise, unintentional weight loss, skin rash, eye pain, sinus congestion and sinus pain, sore throat, dysphagia,  hemoptysis , cough, dyspnea, wheezing, chest pain, palpitations, orthopnea, edema, abdominal pain, nausea, melena, diarrhea, constipation, flank pain, dysuria, hematuria, urinary  Frequency, nocturia, numbness, tingling, seizures,  Focal weakness, Loss of consciousness,  Tremor, insomnia, depression, anxiety, and suicidal ideation.      Objective:  BP 130/78 mmHg  Pulse 73  Temp(Src) 98.2 F (36.8 C) (Oral)  Resp 12  Ht 5' 2.5" (1.588 m)  Wt 124 lb 8 oz (56.473 kg)  BMI 22.39 kg/m2  SpO2 97%  BP Readings from Last 3 Encounters:  03/30/16 130/78  02/24/16 118/66  11/25/15 136/82    Wt Readings from Last 3 Encounters:  03/30/16 124 lb 8 oz (56.473 kg)  02/24/16 124 lb 9.6 oz (56.518 kg)  11/25/15 125 lb (56.7 kg)    General appearance: alert, cooperative and appears stated age Ears: normal TM's and external ear canals both ears Throat: lips, mucosa, and  tongue normal; teeth and gums normal Neck: no adenopathy, no carotid bruit, supple, symmetrical, trachea midline and thyroid not enlarged, symmetric, no tenderness/mass/nodules Back: symmetric, no curvature. ROM normal. No CVA tenderness. Lungs: clear to auscultation bilaterally Heart: regular rate and rhythm, S1, S2 normal, no murmur, click, rub or gallop Abdomen: soft, non-tender; bowel sounds normal; no masses,  no organomegaly Pulses: 2+ and symmetric Skin: Skin color, texture, turgor normal. No  rashes or lesions Lymph nodes: Cervical, supraclavicular, and axillary nodes normal.  Lab Results  Component Value Date   HGBA1C 5.7 01/19/2015    Lab Results  Component Value Date   CREATININE 0.74 11/23/2015   CREATININE 0.72 04/08/2015   CREATININE 0.82 12/28/2014    Lab Results  Component Value Date   WBC 5.6 11/23/2015   HGB 12.9 11/23/2015   HCT 38.7 11/23/2015   PLT 241.0 11/23/2015   GLUCOSE 93 11/23/2015   CHOL 190 11/23/2015   TRIG 54.0 11/23/2015   HDL 71.00 11/23/2015   LDLDIRECT 163.0 10/14/2013   LDLCALC 109* 11/23/2015   ALT 19 11/23/2015   AST 23 11/23/2015   NA 141 11/23/2015   K 4.1 11/23/2015   CL 105 11/23/2015   CREATININE 0.74 11/23/2015   BUN 18 11/23/2015   CO2 29 11/23/2015   TSH 0.92 11/23/2015   INR 1.0 01/19/2015   HGBA1C 5.7 01/19/2015    Mm Screening Breast Tomo Bilateral  12/29/2015  CLINICAL DATA:  Screening. EXAM: DIGITAL SCREENING BILATERAL MAMMOGRAM WITH 3D TOMO WITH CAD COMPARISON:  Previous exam(s). ACR Breast Density Category d: The breast tissue is extremely dense, which lowers the sensitivity of mammography. FINDINGS: There are no findings suspicious for malignancy. Images were processed with CAD. IMPRESSION: No mammographic evidence of malignancy. A result letter of this screening mammogram will be mailed directly to the patient. RECOMMENDATION: Screening mammogram in one year. (Code:SM-B-01Y) BI-RADS CATEGORY  1: Negative. Electronically Signed   By: Ammie Ferrier M.D.   On: 12/29/2015 08:25    Assessment & Plan:   Problem List Items Addressed This Visit    Hyperlipidemia - Primary    LDL  Was < 100  on current medications. She has no side effects and is due for repeat testing,   No changes today.  Lab Results  Component Value Date   CHOL 190 11/23/2015   HDL 71.00 11/23/2015   LDLCALC 109* 11/23/2015   LDLDIRECT 163.0 10/14/2013   TRIG 54.0 11/23/2015   CHOLHDL 3 11/23/2015   Lab Results  Component Value Date    ALT 19 11/23/2015   AST 23 11/23/2015   ALKPHOS 56 11/23/2015   BILITOT 0.5 11/23/2015            Relevant Orders   Comprehensive metabolic panel   Lipid panel   Hypercalcemia    Resolved with parathyroidectomy.  Lab Results  Component Value Date   CALCIUM 9.5 11/23/2015   PHOS 2.8 12/16/2013        Hyperparathyroidism, primary ([place])   RESOLVED: Bone bruise   Carotid stenosis    She will follow up with Dr Fletcher Anon for monitoring.  Continue asprin and statin with goal LDL < 100.  Lab Results  Component Value Date   CHOL 190 11/23/2015   HDL 71.00 11/23/2015   LDLCALC 109* 11/23/2015   LDLDIRECT 163.0 10/14/2013   TRIG 54.0 11/23/2015   CHOLHDL 3 11/23/2015         Hypothyroidism    Thyroid function is WNL on current dose.  No current changes needed.   Lab Results  Component Value Date   TSH 0.92 11/23/2015   .          Relevant Orders   TSH   Thoracic back pain    Currently resolved wit PT, massage and manipulation        Other Visit Diagnoses    Vitamin D deficiency        Relevant Orders    VITAMIN D 25 Hydroxy (Vit-D Deficiency, Fractures)      A total of  25 minutes was spent with patient more than half of which was spent in counseling patient on the above mentioned issues , reviewing and explaining recent labs and imaging studies done, and coordination of care. I have discontinued Ms. Hunnicutt tobramycin. I am also having her maintain her calcium-vitamin D, multivitamin with minerals, aspirin-acetaminophen-caffeine, ibuprofen, levothyroxine, Biotin, folic acid, simvastatin, and baclofen.  No orders of the defined types were placed in this encounter.    Medications Discontinued During This Encounter  Medication Reason  . tobramycin (TOBREX) 0.3 % ophthalmic solution Completed Course  . levothyroxine (SYNTHROID, LEVOTHROID) 75 MCG tablet Duplicate    Follow-up: Return in about 6 months (around 09/30/2016), or fasitng labs next 2 weeks  .   Crecencio Mc, MD

## 2016-03-30 NOTE — Patient Instructions (Signed)
I'm glad your neck/shoulder is feeling better  If the pain returns , we can order x rays of neck to see if the issue is alignment or degenerative.  Return for fasting labs piror to running out of thyroid medication

## 2016-03-30 NOTE — Progress Notes (Signed)
Pre-visit discussion using our clinic review tool. No additional management support is needed unless otherwise documented below in the visit note.  

## 2016-04-01 NOTE — Assessment & Plan Note (Signed)
Thyroid function is WNL on current dose.  No current changes needed.   Lab Results  Component Value Date   TSH 0.92 11/23/2015   .

## 2016-04-01 NOTE — Assessment & Plan Note (Signed)
LDL  Was < 100  on current medications. She has no side effects and is due for repeat testing,   No changes today.  Lab Results  Component Value Date   CHOL 190 11/23/2015   HDL 71.00 11/23/2015   LDLCALC 109* 11/23/2015   LDLDIRECT 163.0 10/14/2013   TRIG 54.0 11/23/2015   CHOLHDL 3 11/23/2015   Lab Results  Component Value Date   ALT 19 11/23/2015   AST 23 11/23/2015   ALKPHOS 56 11/23/2015   BILITOT 0.5 11/23/2015

## 2016-04-01 NOTE — Assessment & Plan Note (Signed)
Resolved with parathyroidectomy.  Lab Results  Component Value Date   CALCIUM 9.5 11/23/2015   PHOS 2.8 12/16/2013

## 2016-04-01 NOTE — Assessment & Plan Note (Signed)
She will follow up with Dr Fletcher Anon for monitoring.  Continue asprin and statin with goal LDL < 100.  Lab Results  Component Value Date   CHOL 190 11/23/2015   HDL 71.00 11/23/2015   LDLCALC 109* 11/23/2015   LDLDIRECT 163.0 10/14/2013   TRIG 54.0 11/23/2015   CHOLHDL 3 11/23/2015

## 2016-04-01 NOTE — Assessment & Plan Note (Signed)
Currently resolved wit PT, massage and manipulation

## 2016-04-04 ENCOUNTER — Other Ambulatory Visit (INDEPENDENT_AMBULATORY_CARE_PROVIDER_SITE_OTHER): Payer: Medicare Other

## 2016-04-04 DIAGNOSIS — E038 Other specified hypothyroidism: Secondary | ICD-10-CM | POA: Diagnosis not present

## 2016-04-04 DIAGNOSIS — E034 Atrophy of thyroid (acquired): Secondary | ICD-10-CM

## 2016-04-04 DIAGNOSIS — E785 Hyperlipidemia, unspecified: Secondary | ICD-10-CM | POA: Diagnosis not present

## 2016-04-04 DIAGNOSIS — E559 Vitamin D deficiency, unspecified: Secondary | ICD-10-CM | POA: Diagnosis not present

## 2016-04-04 LAB — TSH: TSH: 1.29 u[IU]/mL (ref 0.35–4.50)

## 2016-04-04 LAB — LIPID PANEL
Cholesterol: 195 mg/dL (ref 0–200)
HDL: 77.2 mg/dL (ref >39.00)
LDL Cholesterol: 105 mg/dL — ABNORMAL HIGH (ref 0–99)
NonHDL: 118.28
Total CHOL/HDL Ratio: 3
Triglycerides: 66 mg/dL (ref 0.0–149.0)
VLDL: 13.2 mg/dL (ref 0.0–40.0)

## 2016-04-04 LAB — COMPREHENSIVE METABOLIC PANEL
ALT: 21 U/L (ref 0–35)
AST: 21 U/L (ref 0–37)
Albumin: 4.2 g/dL (ref 3.5–5.2)
Alkaline Phosphatase: 45 U/L (ref 39–117)
BUN: 19 mg/dL (ref 6–23)
CO2: 27 mEq/L (ref 19–32)
Calcium: 9.3 mg/dL (ref 8.4–10.5)
Chloride: 104 mEq/L (ref 96–112)
Creatinine, Ser: 0.77 mg/dL (ref 0.40–1.20)
GFR: 78.09 mL/min (ref >60.00)
Glucose, Bld: 98 mg/dL (ref 70–99)
Potassium: 3.7 mEq/L (ref 3.5–5.1)
Sodium: 140 mEq/L (ref 135–145)
Total Bilirubin: 0.4 mg/dL (ref 0.2–1.2)
Total Protein: 6.9 g/dL (ref 6.0–8.3)

## 2016-04-04 LAB — VITAMIN D 25 HYDROXY (VIT D DEFICIENCY, FRACTURES): VITD: 39.48 ng/mL (ref 30.00–100.00)

## 2016-04-05 DIAGNOSIS — M542 Cervicalgia: Secondary | ICD-10-CM | POA: Diagnosis not present

## 2016-04-05 DIAGNOSIS — M6281 Muscle weakness (generalized): Secondary | ICD-10-CM | POA: Diagnosis not present

## 2016-04-06 DIAGNOSIS — M542 Cervicalgia: Secondary | ICD-10-CM | POA: Diagnosis not present

## 2016-04-06 DIAGNOSIS — M6281 Muscle weakness (generalized): Secondary | ICD-10-CM | POA: Diagnosis not present

## 2016-04-07 ENCOUNTER — Encounter: Payer: Self-pay | Admitting: Internal Medicine

## 2016-04-25 DIAGNOSIS — M6281 Muscle weakness (generalized): Secondary | ICD-10-CM | POA: Diagnosis not present

## 2016-04-25 DIAGNOSIS — M542 Cervicalgia: Secondary | ICD-10-CM | POA: Diagnosis not present

## 2016-05-07 ENCOUNTER — Ambulatory Visit (INDEPENDENT_AMBULATORY_CARE_PROVIDER_SITE_OTHER): Payer: Medicare Other | Admitting: Cardiovascular Disease

## 2016-05-07 ENCOUNTER — Encounter: Payer: Self-pay | Admitting: Cardiovascular Disease

## 2016-05-07 VITALS — BP 126/80 | HR 63 | Ht 63.0 in | Wt 122.0 lb

## 2016-05-07 DIAGNOSIS — I6523 Occlusion and stenosis of bilateral carotid arteries: Secondary | ICD-10-CM

## 2016-05-07 MED ORDER — SIMVASTATIN 40 MG PO TABS
40.0000 mg | ORAL_TABLET | Freq: Every day | ORAL | Status: DC
Start: 2016-05-07 — End: 2017-08-01

## 2016-05-07 NOTE — Patient Instructions (Signed)
Medication Instructions:  Your physician recommends that you continue on your current medications as directed. Please refer to the Current Medication list given to you today.   Labwork: none  Testing/Procedures: Your physician has requested that you have a carotid duplex. This test is an ultrasound of the carotid arteries in your neck. It looks at blood flow through these arteries that supply the brain with blood. Allow one hour for this exam. There are no restrictions or special instructions.    Follow-Up: Your physician recommends that you schedule a follow-up appointment as needed with Dr. Fletcher Anon.    Any Other Special Instructions Will Be Listed Below (If Applicable).     If you need a refill on your cardiac medications before your next appointment, please call your pharmacy.
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
## 2016-05-08 ENCOUNTER — Ambulatory Visit: Payer: Medicare Other

## 2016-05-08 DIAGNOSIS — I6523 Occlusion and stenosis of bilateral carotid arteries: Secondary | ICD-10-CM | POA: Diagnosis not present

## 2016-07-11 ENCOUNTER — Other Ambulatory Visit: Payer: Self-pay | Admitting: Internal Medicine

## 2016-09-11 DIAGNOSIS — R05 Cough: Secondary | ICD-10-CM | POA: Diagnosis not present

## 2016-09-11 DIAGNOSIS — R42 Dizziness and giddiness: Secondary | ICD-10-CM | POA: Diagnosis not present

## 2016-09-11 DIAGNOSIS — H8111 Benign paroxysmal vertigo, right ear: Secondary | ICD-10-CM | POA: Diagnosis not present

## 2016-09-20 DIAGNOSIS — Z23 Encounter for immunization: Secondary | ICD-10-CM | POA: Diagnosis not present

## 2016-09-24 DIAGNOSIS — R42 Dizziness and giddiness: Secondary | ICD-10-CM | POA: Diagnosis not present

## 2016-09-26 ENCOUNTER — Telehealth: Payer: Self-pay | Admitting: Internal Medicine

## 2016-09-26 ENCOUNTER — Ambulatory Visit (INDEPENDENT_AMBULATORY_CARE_PROVIDER_SITE_OTHER): Payer: Medicare Other | Admitting: Internal Medicine

## 2016-09-26 ENCOUNTER — Encounter: Payer: Self-pay | Admitting: Internal Medicine

## 2016-09-26 VITALS — BP 128/76 | HR 75 | Temp 98.0°F | Resp 10 | Ht 62.5 in | Wt 124.8 lb

## 2016-09-26 DIAGNOSIS — F4381 Prolonged grief disorder: Secondary | ICD-10-CM

## 2016-09-26 DIAGNOSIS — E782 Mixed hyperlipidemia: Secondary | ICD-10-CM

## 2016-09-26 DIAGNOSIS — I6523 Occlusion and stenosis of bilateral carotid arteries: Secondary | ICD-10-CM | POA: Diagnosis not present

## 2016-09-26 DIAGNOSIS — E034 Atrophy of thyroid (acquired): Secondary | ICD-10-CM | POA: Diagnosis not present

## 2016-09-26 DIAGNOSIS — F4321 Adjustment disorder with depressed mood: Secondary | ICD-10-CM

## 2016-09-26 DIAGNOSIS — E210 Primary hyperparathyroidism: Secondary | ICD-10-CM

## 2016-09-26 DIAGNOSIS — R419 Unspecified symptoms and signs involving cognitive functions and awareness: Secondary | ICD-10-CM | POA: Diagnosis not present

## 2016-09-26 DIAGNOSIS — F4329 Adjustment disorder with other symptoms: Secondary | ICD-10-CM

## 2016-09-26 LAB — COMPREHENSIVE METABOLIC PANEL
ALT: 17 U/L (ref 0–35)
AST: 20 U/L (ref 0–37)
Albumin: 4.4 g/dL (ref 3.5–5.2)
Alkaline Phosphatase: 50 U/L (ref 39–117)
BUN: 15 mg/dL (ref 6–23)
CO2: 29 mEq/L (ref 19–32)
Calcium: 9.9 mg/dL (ref 8.4–10.5)
Chloride: 104 mEq/L (ref 96–112)
Creatinine, Ser: 0.77 mg/dL (ref 0.40–1.20)
GFR: 77.99 mL/min (ref >60.00)
Glucose, Bld: 94 mg/dL (ref 70–99)
Potassium: 3.9 mEq/L (ref 3.5–5.1)
Sodium: 142 mEq/L (ref 135–145)
Total Bilirubin: 0.6 mg/dL (ref 0.2–1.2)
Total Protein: 6.9 g/dL (ref 6.0–8.3)

## 2016-09-26 LAB — LIPID PANEL
Cholesterol: 197 mg/dL (ref 0–200)
HDL: 82.1 mg/dL (ref >39.00)
LDL Cholesterol: 101 mg/dL — ABNORMAL HIGH (ref 0–99)
NonHDL: 114.55
Total CHOL/HDL Ratio: 2
Triglycerides: 67 mg/dL (ref 0.0–149.0)
VLDL: 13.4 mg/dL (ref 0.0–40.0)

## 2016-09-26 LAB — VITAMIN D 25 HYDROXY (VIT D DEFICIENCY, FRACTURES): VITD: 37.69 ng/mL (ref 30.00–100.00)

## 2016-09-26 LAB — TSH: TSH: 0.66 u[IU]/mL (ref 0.35–4.50)

## 2016-09-26 LAB — VITAMIN B12: Vitamin B-12: 858 pg/mL (ref 211–911)

## 2016-09-26 NOTE — Progress Notes (Signed)
Subjective:  Patient ID: Raven [name], Raven    DOB: 1941/08/05  Age: 75 y.o. MRN: [id number]  CC: The primary encounter diagnosis was Hypothyroidism due to acquired atrophy of thyroid. Diagnoses of Bilateral carotid artery stenosis, Mixed hyperlipidemia, Hyperparathyroidism, primary ([place]), Cognitive complaints with normal exam, and Grief reaction with prolonged bereavement were also pertinent to this visit.  HPI Raven [name] presents for follow up on multiple issues.    Vertigo: started in February.  Brought on by positional changes, from supine to standing and vice versa; .  improved with ENT  evaluation  And treatment with HP maneuver ,was seen for follow up  3 days ago .  Emotionally difficult month .  Reports more stress  Due to increased responsibilities at [employer] and at her church.  Also has recurrent grief every October due to  the anniversary of her husband's death.  Her husband was killed instantly on 09/25/08 during an [place]  While they While [name] for  his birthday Oct.15th.  She  Has been feeling grief all month. 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 Until the  president died on 2023/09/26! So now she is president of the auxiliary as well.  She has had grief counselling one on one.. Sleeping well, but staying very busy.  Worried about her short term memory starting to fail.     Son and family coming for a week at thanksgiving from [place]. Already trying to plan every meal for the week     Outpatient Medications Prior to Visit  Medication Sig Dispense Refill  . aspirin-acetaminophen-caffeine (EXCEDRIN MIGRAINE) 250-250-65 MG per tablet Take 1 tablet by mouth every 6 (six) hours as needed for headache.    . Biotin 2500 MCG CAPS Take by mouth.    . calcium-vitamin D (OSCAL WITH D) 500-200 MG-UNIT per tablet Take 1 tablet by mouth daily with breakfast.    . folic acid (FOLVITE) A999333 MCG tablet Take 400 mcg by mouth daily.    Marland Kitchen ibuprofen  (ADVIL,MOTRIN) 200 MG tablet Take 200-400 mg by mouth every 6 (six) hours as needed for mild pain or moderate pain.    Marland Kitchen levothyroxine (SYNTHROID, LEVOTHROID) 75 MCG tablet TAKE 1 TABLET BY MOUTH DAILY BEFORE BREAKFAST 90 tablet 1  . Multiple Vitamin (MULTIVITAMIN WITH MINERALS) TABS tablet Take 1 tablet by mouth daily.    . simvastatin (ZOCOR) 40 MG tablet Take 1 tablet (40 mg total) by mouth at bedtime. 90 tablet 3   No facility-administered medications prior to visit.     Review of Systems;  Patient denies headache, fevers, malaise, unintentional weight loss, skin rash, eye pain, sinus congestion and sinus pain, sore throat, dysphagia,  hemoptysis , cough, dyspnea, wheezing, chest pain, palpitations, orthopnea, edema, abdominal pain, nausea, melena, diarrhea, constipation, flank pain, dysuria, hematuria, urinary  Frequency, nocturia, numbness, tingling, seizures,  Focal weakness, Loss of consciousness,  Tremor, insomnia, depression, anxiety, and suicidal ideation.      Objective:  BP 128/76   Pulse 75   Temp 98 F (36.7 C) (Oral)   Resp 10   Ht 5' 2.5" (1.588 m)   Wt 124 lb 12 oz (56.6 kg)   SpO2 97%   BMI 22.45 kg/m   BP Readings from Last 3 Encounters:  09/26/16 128/76  05/07/16 126/80  03/30/16 130/78    Wt Readings from Last 3 Encounters:  09/26/16 124 lb 12 oz (56.6 kg)  05/07/16 122  lb (55.3 kg)  03/30/16 124 lb 8 oz (56.5 kg)    General appearance: alert, cooperative and appears stated age Ears: normal TM's and external ear canals both ears Throat: lips, mucosa, and tongue normal; teeth and gums normal Neck: no adenopathy, no carotid bruit, supple, symmetrical, trachea midline and thyroid not enlarged, symmetric, no tenderness/mass/nodules Back: symmetric, no curvature. ROM normal. No CVA tenderness. Lungs: clear to auscultation bilaterally Heart: regular rate and rhythm, S1, S2 normal, no murmur, click, rub or gallop Abdomen: soft, non-tender; bowel sounds  normal; no masses,  no organomegaly Pulses: 2+ and symmetric Skin: Skin color, texture, turgor normal. No rashes or lesions Lymph nodes: Cervical, supraclavicular, and axillary nodes normal.  Lab Results  Component Value Date   HGBA1C 5.7 01/19/2015    Lab Results  Component Value Date   CREATININE 0.77 09/26/2016   CREATININE 0.77 04/04/2016   CREATININE 0.74 11/23/2015    Lab Results  Component Value Date   WBC 5.6 11/23/2015   HGB 12.9 11/23/2015   HCT 38.7 11/23/2015   PLT 241.0 11/23/2015   GLUCOSE 94 09/26/2016   CHOL 197 09/26/2016   TRIG 67.0 09/26/2016   HDL 82.10 09/26/2016   LDLDIRECT 163.0 10/14/2013   LDLCALC 101 (H) 09/26/2016   ALT 17 09/26/2016   AST 20 09/26/2016   NA 142 09/26/2016   K 3.9 09/26/2016   CL 104 09/26/2016   CREATININE 0.77 09/26/2016   BUN 15 09/26/2016   CO2 29 09/26/2016   TSH 0.66 09/26/2016   INR 1.0 01/19/2015   HGBA1C 5.7 01/19/2015    Mm Screening Breast Tomo Bilateral  Result Date: 12/29/2015 CLINICAL DATA:  Screening. EXAM: DIGITAL SCREENING BILATERAL MAMMOGRAM WITH 3D TOMO WITH CAD COMPARISON:  Previous exam(s). ACR Breast Density Category d: The breast tissue is extremely dense, which lowers the sensitivity of mammography. FINDINGS: There are no findings suspicious for malignancy. Images were processed with CAD. IMPRESSION: No mammographic evidence of malignancy. A result letter of this screening mammogram will be mailed directly to the patient. RECOMMENDATION: Screening mammogram in one year. (Code:SM-B-01Y) BI-RADS CATEGORY  1: Negative. Electronically Signed   By: Ammie Ferrier M.D.   On: 12/29/2015 08:25    Assessment & Plan:   Problem List Items Addressed This Visit    Grief reaction with prolonged bereavement    Reassured patient that her concerns about her short term memory are unlikely to be due to dementia, but due to grief ,  Anxiety, and increased responsibilities resulting in too much multitasking. Marland Kitchen  MMSE  was done today and her score was 30/30. Return in 3 months.       Hyperlipidemia   Hyperparathyroidism, primary ([place])   Relevant Orders   Comprehensive metabolic panel (Completed)   VITAMIN D 25 Hydroxy (Vit-D Deficiency, Fractures) (Completed)   Carotid stenosis   Relevant Orders   Lipid panel (Completed)   Hypothyroidism - Primary   Relevant Orders   TSH (Completed)    Other Visit Diagnoses    Cognitive complaints with normal exam       Relevant Orders   Vitamin B12 (Completed)   Methylmalonic Acid   Folate RBC     A total of 40 minutes of face to face time was spent with patient more than half of which was spent in counselling and coordination of care   I am having Ms. Ralph maintain her calcium-vitamin D, multivitamin with minerals, aspirin-acetaminophen-caffeine, ibuprofen, Biotin, folic acid, simvastatin, and levothyroxine.  No orders of  the defined types were placed in this encounter.   There are no discontinued medications.  Follow-up: Return in about 3 months (around 12/27/2016).   Crecencio Mc, MD

## 2016-09-26 NOTE — Patient Instructions (Addendum)
Your short term memory issues are most likely from too much "multitasking" and not enough "processing."  Schedule quiet time every day 15-20  Minutes in the afternoon and before bedtime. Turn off cell phone when you go for a walk.    Don't skip breakfast:  You might want to try a premixed protein drink called Premier Protein shake for breakfast or late night snack . It is great tasting,   very low sugar and available of < $2 serving at [facility] and  In bulk for $1.50/serving at [employer] and [employer]  .    Nutritional analysis :  160 cal  30 g protein  1 g sugar 50% calcium needs   [employer] and [employer]  I'll see you in 3 months.  If you decide you want counselling or medication,  Let me know.

## 2016-09-26 NOTE — Progress Notes (Signed)
Pre-visit discussion using our clinic review tool. No additional management support is needed unless otherwise documented below in the visit note.  

## 2016-09-26 NOTE — Telephone Encounter (Signed)
I called pt and left a vm to call office to sch AWV. Thank you! °

## 2016-09-27 LAB — FOLATE RBC: RBC Folate: 824 ng/mL (ref >280)

## 2016-09-29 DIAGNOSIS — F4321 Adjustment disorder with depressed mood: Secondary | ICD-10-CM | POA: Insufficient documentation

## 2016-09-29 DIAGNOSIS — F4381 Prolonged grief disorder: Secondary | ICD-10-CM | POA: Insufficient documentation

## 2016-09-29 DIAGNOSIS — F4329 Adjustment disorder with other symptoms: Secondary | ICD-10-CM | POA: Insufficient documentation

## 2016-09-29 LAB — METHYLMALONIC ACID, SERUM: Methylmalonic Acid, Quant: 117 nmol/L (ref 87–318)

## 2016-09-29 NOTE — Assessment & Plan Note (Addendum)
Reassured patient that her concerns about her short term memory are unlikely to be due to dementia, but due to grief ,  Anxiety, and increased responsibilities resulting in too much multitasking. Marland Kitchen  MMSE was done today and her score was 30/30. Return in 3 months.

## 2016-09-30 ENCOUNTER — Encounter: Payer: Self-pay | Admitting: Internal Medicine

## 2016-10-02 ENCOUNTER — Ambulatory Visit: Payer: Federal, State, Local not specified - PPO | Admitting: Internal Medicine

## 2016-10-24 ENCOUNTER — Telehealth: Payer: Self-pay | Admitting: Internal Medicine

## 2016-10-24 NOTE — Telephone Encounter (Signed)
Pt has declined to get AWV. Thank you!

## 2016-12-19 DIAGNOSIS — L821 Other seborrheic keratosis: Secondary | ICD-10-CM | POA: Diagnosis not present

## 2016-12-19 DIAGNOSIS — L820 Inflamed seborrheic keratosis: Secondary | ICD-10-CM | POA: Diagnosis not present

## 2016-12-19 DIAGNOSIS — L812 Freckles: Secondary | ICD-10-CM | POA: Diagnosis not present

## 2016-12-19 DIAGNOSIS — L578 Other skin changes due to chronic exposure to nonionizing radiation: Secondary | ICD-10-CM | POA: Diagnosis not present

## 2016-12-19 DIAGNOSIS — L570 Actinic keratosis: Secondary | ICD-10-CM | POA: Diagnosis not present

## 2016-12-27 ENCOUNTER — Ambulatory Visit: Payer: Federal, State, Local not specified - PPO | Admitting: Internal Medicine

## 2016-12-31 ENCOUNTER — Ambulatory Visit: Payer: Federal, State, Local not specified - PPO | Admitting: Internal Medicine

## 2017-01-04 ENCOUNTER — Other Ambulatory Visit: Payer: Self-pay | Admitting: Internal Medicine

## 2017-02-15 ENCOUNTER — Other Ambulatory Visit: Payer: Self-pay | Admitting: Internal Medicine

## 2017-02-15 DIAGNOSIS — Z1231 Encounter for screening mammogram for malignant neoplasm of breast: Secondary | ICD-10-CM

## 2017-03-11 ENCOUNTER — Ambulatory Visit
Admission: RE | Admit: 2017-03-11 | Discharge: 2017-03-11 | Disposition: A | Discharge status: Home / Self Care | Payer: Medicare Other | Source: Ambulatory Visit | Attending: Internal Medicine | Admitting: Internal Medicine

## 2017-03-11 DIAGNOSIS — Z1231 Encounter for screening mammogram for malignant neoplasm of breast: Secondary | ICD-10-CM | POA: Diagnosis not present

## 2017-04-30 ENCOUNTER — Telehealth: Payer: Self-pay | Admitting: Internal Medicine

## 2017-04-30 NOTE — Telephone Encounter (Signed)
Left pt message asking to call Allison back directly at [phone] to schedule AWV. Thanks! °

## 2017-05-21 ENCOUNTER — Other Ambulatory Visit: Payer: Self-pay | Admitting: Cardiovascular Disease

## 2017-05-21 NOTE — Telephone Encounter (Signed)
Patient was last seen 05/07/2016 and was told to follow up as needed.  Is it okay to refill this medication without an appointment?  Do we need to try to get the patient to come in for a check up?

## 2017-05-25 ENCOUNTER — Emergency Department: Payer: Medicare Other

## 2017-05-25 ENCOUNTER — Emergency Department
Admission: EM | Admit: 2017-05-25 | Discharge: 2017-05-25 | Disposition: A | Discharge status: Home / Self Care | Payer: Medicare Other | Attending: Emergency Medicine | Admitting: Emergency Medicine

## 2017-05-25 ENCOUNTER — Encounter: Payer: Self-pay | Admitting: Emergency Medicine

## 2017-05-25 DIAGNOSIS — S61412A Laceration without foreign body of left hand, initial encounter: Secondary | ICD-10-CM | POA: Diagnosis not present

## 2017-05-25 DIAGNOSIS — W260XXA Contact with knife, initial encounter: Secondary | ICD-10-CM | POA: Insufficient documentation

## 2017-05-25 DIAGNOSIS — Y929 Unspecified place or not applicable: Secondary | ICD-10-CM | POA: Insufficient documentation

## 2017-05-25 DIAGNOSIS — Y93G1 Activity, food preparation and clean up: Secondary | ICD-10-CM | POA: Diagnosis not present

## 2017-05-25 DIAGNOSIS — Z79899 Other long term (current) drug therapy: Secondary | ICD-10-CM | POA: Diagnosis not present

## 2017-05-25 DIAGNOSIS — Y998 Other external cause status: Secondary | ICD-10-CM | POA: Diagnosis not present

## 2017-05-25 DIAGNOSIS — E079 Disorder of thyroid, unspecified: Secondary | ICD-10-CM | POA: Insufficient documentation

## 2017-05-25 DIAGNOSIS — S6992XA Unspecified injury of left wrist, hand and finger(s), initial encounter: Secondary | ICD-10-CM | POA: Diagnosis not present

## 2017-05-25 MED ORDER — LIDOCAINE HCL (PF) 1 % IJ SOLN
5.0000 mL | Freq: Once | INTRAMUSCULAR | Status: AC
  Administered 2017-05-25: 5 mL
  Filled 2017-05-25: qty 5
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
## 2017-05-30 NOTE — Telephone Encounter (Signed)
Pt will call back when she returns from out of town.  *NOTE-Last AWV was 11/25/2015; pt is due anytime for AWV

## 2017-06-18 DIAGNOSIS — L578 Other skin changes due to chronic exposure to nonionizing radiation: Secondary | ICD-10-CM | POA: Diagnosis not present

## 2017-06-18 DIAGNOSIS — L812 Freckles: Secondary | ICD-10-CM | POA: Diagnosis not present

## 2017-06-18 DIAGNOSIS — D1800 Hemangioma unspecified site: Secondary | ICD-10-CM | POA: Diagnosis not present

## 2017-06-18 DIAGNOSIS — L821 Other seborrheic keratosis: Secondary | ICD-10-CM | POA: Diagnosis not present

## 2017-06-18 DIAGNOSIS — L820 Inflamed seborrheic keratosis: Secondary | ICD-10-CM | POA: Diagnosis not present

## 2017-06-27 ENCOUNTER — Encounter: Payer: Self-pay | Admitting: Internal Medicine

## 2017-06-27 ENCOUNTER — Ambulatory Visit (INDEPENDENT_AMBULATORY_CARE_PROVIDER_SITE_OTHER): Payer: Medicare Other | Admitting: Internal Medicine

## 2017-06-27 VITALS — BP 130/82 | HR 62 | Temp 97.7°F | Resp 15 | Ht 62.0 in | Wt 125.8 lb

## 2017-06-27 DIAGNOSIS — E034 Atrophy of thyroid (acquired): Secondary | ICD-10-CM | POA: Diagnosis not present

## 2017-06-27 DIAGNOSIS — E782 Mixed hyperlipidemia: Secondary | ICD-10-CM | POA: Diagnosis not present

## 2017-06-27 DIAGNOSIS — R419 Unspecified symptoms and signs involving cognitive functions and awareness: Secondary | ICD-10-CM

## 2017-06-27 LAB — COMPREHENSIVE METABOLIC PANEL
ALT: 15 U/L (ref 0–35)
AST: 21 U/L (ref 0–37)
Albumin: 4.2 g/dL (ref 3.5–5.2)
Alkaline Phosphatase: 49 U/L (ref 39–117)
BUN: 17 mg/dL (ref 6–23)
CO2: 30 mEq/L (ref 19–32)
Calcium: 9.7 mg/dL (ref 8.4–10.5)
Chloride: 103 mEq/L (ref 96–112)
Creatinine, Ser: 0.79 mg/dL (ref 0.40–1.20)
GFR: 75.56 mL/min (ref >60.00)
Glucose, Bld: 102 mg/dL — ABNORMAL HIGH (ref 70–99)
Potassium: 4.3 mEq/L (ref 3.5–5.1)
Sodium: 139 mEq/L (ref 135–145)
Total Bilirubin: 0.6 mg/dL (ref 0.2–1.2)
Total Protein: 7 g/dL (ref 6.0–8.3)

## 2017-06-27 LAB — TSH: TSH: 0.4 u[IU]/mL (ref 0.35–4.50)

## 2017-06-27 LAB — LIPID PANEL
Cholesterol: 188 mg/dL (ref 0–200)
HDL: 80.9 mg/dL (ref >39.00)
LDL Cholesterol: 96 mg/dL (ref 0–99)
NonHDL: 106.73
Total CHOL/HDL Ratio: 2
Triglycerides: 54 mg/dL (ref 0.0–149.0)
VLDL: 10.8 mg/dL (ref 0.0–40.0)

## 2017-06-27 NOTE — Patient Instructions (Addendum)
You have NO SIGNS of dementia on the testing that we did today    Limit alcohol to one glass per night with dinner  Turn off I pad and lap top/phone by 9 pm!    Keep bedroom for pleasure only and rest  No caffeine past 1 pm    Referral to [facility] Pigg for  More indepth cognitive testing
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
## 2017-06-29 DIAGNOSIS — R419 Unspecified symptoms and signs involving cognitive functions and awareness: Secondary | ICD-10-CM | POA: Insufficient documentation

## 2017-06-29 NOTE — Assessment & Plan Note (Signed)
Referral to [facility] P[facility] for cognitive testing.  Encouraged to limit use of alcohol,  Blue lit machines p to 2 hours before bedtime.

## 2017-07-01 ENCOUNTER — Other Ambulatory Visit: Payer: Self-pay | Admitting: Internal Medicine

## 2017-07-11 ENCOUNTER — Encounter: Payer: Self-pay | Admitting: Psychology

## 2017-07-22 ENCOUNTER — Encounter: Payer: Self-pay | Admitting: Internal Medicine

## 2017-07-23 DIAGNOSIS — H2513 Age-related nuclear cataract, bilateral: Secondary | ICD-10-CM | POA: Diagnosis not present

## 2017-08-01 ENCOUNTER — Telehealth: Payer: Self-pay

## 2017-08-01 ENCOUNTER — Other Ambulatory Visit: Payer: Self-pay

## 2017-08-01 MED ORDER — SIMVASTATIN 40 MG PO TABS: 40.0000 mg | ORAL_TABLET | Freq: Every day | ORAL | 0 refills | Status: DC

## 2017-08-01 NOTE — Telephone Encounter (Signed)
-----   Message from [name], [place] sent at 08/01/2017  2:05 PM EDT ----- Will you try to schedule a follow up appointment to see Dr. Fletcher Anon. Patient is requesting refills but has not been in over a year. Thank you!

## 2017-08-01 NOTE — Telephone Encounter (Signed)
Requested Prescriptions   Signed Prescriptions Disp Refills  . simvastatin (ZOCOR) 40 MG tablet 30 tablet 0    Sig: Take 1 tablet (40 mg total) by mouth at bedtime.    Authorizing Provider: Kathlyn Sacramento A    Ordering User: [name]

## 2017-08-01 NOTE — Telephone Encounter (Signed)
l mom to schedule past due appt

## 2017-08-02 NOTE — Telephone Encounter (Signed)
Pt calling stating her PCP Dr Derrel Nip is taking care of all her medication    Nothing further

## 2017-08-02 NOTE — Telephone Encounter (Signed)
-----   Message from [name], [place] sent at 08/01/2017  2:05 PM EDT ----- Will you try to schedule a follow up appointment to see Dr. Fletcher Anon. Patient is requesting refills but has not been in over a year. Thank you!

## 2017-08-02 NOTE — Telephone Encounter (Signed)
LMOV to schedule appt

## 2017-08-21 NOTE — Telephone Encounter (Signed)
Error

## 2017-08-27 ENCOUNTER — Other Ambulatory Visit: Payer: Self-pay | Admitting: Internal Medicine

## 2017-09-03 ENCOUNTER — Other Ambulatory Visit: Payer: Self-pay | Admitting: Internal Medicine

## 2017-09-18 ENCOUNTER — Telehealth: Payer: Self-pay | Admitting: Internal Medicine

## 2017-09-18 NOTE — Telephone Encounter (Signed)
Pt lvm and is wanting a call back regarding which vaccine Dr. Derrel Nip is recommending for patients 59 and older. Wants a cb before she goes to get injection. She did not state what vaccine she is requesting info on, but assuming it is the flu vaccine. Pt cb [phone]

## 2017-09-19 NOTE — Telephone Encounter (Signed)
Spoke with pt and she stated that she just did not realize that there is two different flu shots. I explained to the pt the difference in the two. She stated that she went a head and got the flu shot yesterday and it was the high dose shot.

## 2017-09-26 ENCOUNTER — Ambulatory Visit (INDEPENDENT_AMBULATORY_CARE_PROVIDER_SITE_OTHER): Payer: Medicare Other | Admitting: Psychology

## 2017-09-26 ENCOUNTER — Encounter: Payer: Self-pay | Admitting: Psychology

## 2017-09-26 DIAGNOSIS — R413 Other amnesia: Secondary | ICD-10-CM
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
## 2017-09-26 NOTE — Progress Notes (Signed)
   Neuropsychology Note  [name] came in today for 1 hour of neuropsychological testing with technician, Milana Kidney, BS, under the supervision of Dr. Macarthur Critchley. The patient did not appear overtly distressed by the testing session, per behavioral observation or via self-report to the technician. Rest breaks were offered. Raven [name] will return within 2 weeks for a feedback session with Dr. Si Raider at which time her test performances, clinical impressions and treatment recommendations will be reviewed in detail. The patient understands she can contact our office should she require our assistance before this time.  Full report to follow.
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
## 2017-10-24 ENCOUNTER — Ambulatory Visit (INDEPENDENT_AMBULATORY_CARE_PROVIDER_SITE_OTHER): Payer: Medicare Other | Admitting: Psychology

## 2017-10-24 ENCOUNTER — Encounter: Payer: Self-pay | Admitting: Psychology

## 2017-10-24 DIAGNOSIS — R413 Other amnesia: Secondary | ICD-10-CM | POA: Diagnosis not present

## 2017-12-26 DIAGNOSIS — L812 Freckles: Secondary | ICD-10-CM | POA: Diagnosis not present

## 2017-12-26 DIAGNOSIS — L570 Actinic keratosis: Secondary | ICD-10-CM | POA: Diagnosis not present

## 2017-12-26 DIAGNOSIS — D225 Melanocytic nevi of trunk: Secondary | ICD-10-CM | POA: Diagnosis not present

## 2017-12-26 DIAGNOSIS — I781 Nevus, non-neoplastic: Secondary | ICD-10-CM | POA: Diagnosis not present

## 2017-12-26 DIAGNOSIS — L821 Other seborrheic keratosis: Secondary | ICD-10-CM | POA: Diagnosis not present

## 2017-12-26 DIAGNOSIS — D1800 Hemangioma unspecified site: Secondary | ICD-10-CM | POA: Diagnosis not present

## 2017-12-26 DIAGNOSIS — D2260 Melanocytic nevi of unspecified upper limb, including shoulder: Secondary | ICD-10-CM | POA: Diagnosis not present

## 2017-12-26 DIAGNOSIS — I8393 Asymptomatic varicose veins of bilateral lower extremities: Secondary | ICD-10-CM | POA: Diagnosis not present

## 2017-12-26 DIAGNOSIS — L578 Other skin changes due to chronic exposure to nonionizing radiation: Secondary | ICD-10-CM | POA: Diagnosis not present

## 2017-12-26 DIAGNOSIS — Z1283 Encounter for screening for malignant neoplasm of skin: Secondary | ICD-10-CM | POA: Diagnosis not present

## 2017-12-30 ENCOUNTER — Ambulatory Visit (INDEPENDENT_AMBULATORY_CARE_PROVIDER_SITE_OTHER): Payer: Medicare Other | Admitting: Internal Medicine

## 2017-12-30 ENCOUNTER — Encounter: Payer: Self-pay | Admitting: Internal Medicine

## 2017-12-30 VITALS — BP 130/84 | HR 77 | Temp 97.9°F | Resp 14 | Ht 62.5 in | Wt 127.2 lb

## 2017-12-30 DIAGNOSIS — E559 Vitamin D deficiency, unspecified: Secondary | ICD-10-CM

## 2017-12-30 DIAGNOSIS — K5900 Constipation, unspecified: Secondary | ICD-10-CM

## 2017-12-30 DIAGNOSIS — R5383 Other fatigue: Secondary | ICD-10-CM

## 2017-12-30 DIAGNOSIS — E039 Hypothyroidism, unspecified: Secondary | ICD-10-CM

## 2017-12-30 DIAGNOSIS — Z1231 Encounter for screening mammogram for malignant neoplasm of breast: Secondary | ICD-10-CM

## 2017-12-30 DIAGNOSIS — Z1239 Encounter for other screening for malignant neoplasm of breast: Secondary | ICD-10-CM

## 2017-12-30 DIAGNOSIS — E782 Mixed hyperlipidemia: Secondary | ICD-10-CM

## 2017-12-30 DIAGNOSIS — I6523 Occlusion and stenosis of bilateral carotid arteries: Secondary | ICD-10-CM | POA: Diagnosis not present

## 2017-12-30 DIAGNOSIS — E785 Hyperlipidemia, unspecified: Secondary | ICD-10-CM | POA: Diagnosis not present

## 2017-12-30 LAB — CBC WITH DIFFERENTIAL/PLATELET
Basophils Absolute: 0.1 10*3/uL (ref 0.0–0.1)
Basophils Relative: 1 % (ref 0.0–3.0)
Eosinophils Absolute: 0.1 10*3/uL (ref 0.0–0.7)
Eosinophils Relative: 2.7 % (ref 0.0–5.0)
HCT: 38.2 % (ref 36.0–46.0)
Hemoglobin: 13.1 g/dL (ref 12.0–15.0)
Lymphocytes Relative: 32.5 % (ref 12.0–46.0)
Lymphs Abs: 1.7 10*3/uL (ref 0.7–4.0)
MCHC: 34.2 g/dL (ref 30.0–36.0)
MCV: 94.4 fl (ref 78.0–100.0)
Monocytes Absolute: 0.4 10*3/uL (ref 0.1–1.0)
Monocytes Relative: 8.6 % (ref 3.0–12.0)
Neutro Abs: 2.8 10*3/uL (ref 1.4–7.7)
Neutrophils Relative %: 55.2 % (ref 43.0–77.0)
Platelets: 207 10*3/uL (ref 150.0–400.0)
RBC: 4.04 Mil/uL (ref 3.87–5.11)
RDW: 13 % (ref 11.5–15.5)
WBC: 5.1 10*3/uL (ref 4.0–10.5)

## 2017-12-30 LAB — LIPID PANEL
Cholesterol: 183 mg/dL (ref 0–200)
HDL: 89.9 mg/dL (ref >39.00)
LDL Cholesterol: 82 mg/dL (ref 0–99)
NonHDL: 93.04
Total CHOL/HDL Ratio: 2
Triglycerides: 53 mg/dL (ref 0.0–149.0)
VLDL: 10.6 mg/dL (ref 0.0–40.0)

## 2017-12-30 LAB — COMPREHENSIVE METABOLIC PANEL
ALT: 17 U/L (ref 0–35)
AST: 21 U/L (ref 0–37)
Albumin: 4.2 g/dL (ref 3.5–5.2)
Alkaline Phosphatase: 52 U/L (ref 39–117)
BUN: 15 mg/dL (ref 6–23)
CO2: 30 mEq/L (ref 19–32)
Calcium: 9.2 mg/dL (ref 8.4–10.5)
Chloride: 104 mEq/L (ref 96–112)
Creatinine, Ser: 0.77 mg/dL (ref 0.40–1.20)
GFR: 77.72 mL/min (ref >60.00)
Glucose, Bld: 91 mg/dL (ref 70–99)
Potassium: 4.2 mEq/L (ref 3.5–5.1)
Sodium: 141 mEq/L (ref 135–145)
Total Bilirubin: 0.5 mg/dL (ref 0.2–1.2)
Total Protein: 7 g/dL (ref 6.0–8.3)

## 2017-12-30 LAB — TSH: TSH: 1.25 u[IU]/mL (ref 0.35–4.50)

## 2017-12-30 LAB — VITAMIN D 25 HYDROXY (VIT D DEFICIENCY, FRACTURES): VITD: 32.87 ng/mL (ref 30.00–100.00)

## 2017-12-30 MED ORDER — ALPRAZOLAM 0.25 MG PO TABS: 0.2500 mg | ORAL_TABLET | Freq: Every evening | ORAL | 0 refills | Status: DC | PRN

## 2017-12-30 MED ORDER — ZOSTER VAC RECOMB ADJUVANTED 50 MCG/0.5ML IM SUSR: 0.5000 mL | Freq: Once | INTRAMUSCULAR | 1 refills | Status: AC

## 2017-12-30 NOTE — Assessment & Plan Note (Signed)
LDL  is < 100  on current medications. She has no side effects.  No changes today.  Lab Results  Component Value Date   CHOL 183 12/30/2017   HDL 89.90 12/30/2017   LDLCALC 82 12/30/2017   LDLDIRECT 163.0 10/14/2013   TRIG 53.0 12/30/2017   CHOLHDL 2 12/30/2017   Lab Results  Component Value Date   ALT 17 12/30/2017   AST 21 12/30/2017   ALKPHOS 52 12/30/2017   BILITOT 0.5 12/30/2017
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
## 2017-12-30 NOTE — Assessment & Plan Note (Signed)
Thyroid function is WNL on current dose.  No current changes needed.   Lab Results  Component Value Date   TSH 1.25 12/30/2017   .

## 2017-12-30 NOTE — Assessment & Plan Note (Signed)
Resolved with parathyroidectomy.  Lab Results  Component Value Date   CALCIUM 9.2 12/30/2017   PHOS 2.8 12/16/2013

## 2017-12-30 NOTE — Patient Instructions (Addendum)
Try the "Headspace" app for more meditation techniques  .alprazolam for "as needed " use when you wake up and can't go back to sleep   The ShingRx vaccine is now available in local pharmacies and is much more protective thant Zostavaxs,  It is therefore ADVISED for all interested adults over 50 to prevent shingles   I will refill the thryoid medicaotin once I check your level,  And the rx will be sent to [facility]   Mammogram will be ordered for [place] Maintenance for Postmenopausal Women Menopause is a normal process in which your reproductive ability comes to an end. This process happens gradually over a span of months to years, usually between the ages of 2 and 24. Menopause is complete when you have missed 12 consecutive menstrual periods. It is important to talk with your health care provider about some of the most common conditions that affect postmenopausal women, such as heart disease, cancer, and bone loss (osteoporosis). Adopting a healthy lifestyle and getting preventive care can help to promote your health and wellness. Those actions can also lower your chances of developing some of these common conditions. What should I know about menopause? During menopause, you may experience a number of symptoms, such as:  Moderate-to-severe hot flashes.  Night sweats.  Decrease in sex drive.  Mood swings.  Headaches.  Tiredness.  Irritability.  Memory problems.  Insomnia.  Choosing to treat or not to treat menopausal changes is an individual decision that you make with your health care provider. What should I know about hormone replacement therapy and supplements? Hormone therapy products are effective for treating symptoms that are associated with menopause, such as hot flashes and night sweats. Hormone replacement carries certain risks, especially as you become older. If you are thinking about using estrogen or estrogen with progestin treatments, discuss the  benefits and risks with your health care provider. What should I know about heart disease and stroke? Heart disease, heart attack, and stroke become more likely as you age. This may be due, in part, to the hormonal changes that your body experiences during menopause. These can affect how your body processes dietary fats, triglycerides, and cholesterol. Heart attack and stroke are both medical emergencies. There are many things that you can do to help prevent heart disease and stroke:  Have your blood pressure checked at least every 1-2 years. High blood pressure causes heart disease and increases the risk of stroke.  If you are 67-60 years old, ask your health care provider if you should take aspirin to prevent a heart attack or a stroke.  Do not use any tobacco products, including cigarettes, chewing tobacco, or electronic cigarettes. If you need help quitting, ask your health care provider.  It is important to eat a healthy diet and maintain a healthy weight. ? Be sure to include plenty of vegetables, fruits, low-fat dairy products, and lean protein. ? Avoid eating foods that are high in solid fats, added sugars, or salt (sodium).  Get regular exercise. This is one of the most important things that you can do for your health. ? Try to exercise for at least 150 minutes each week. The type of exercise that you do should increase your heart rate and make you sweat. This is known as moderate-intensity exercise. ? Try to do strengthening exercises at least twice each week. Do these in addition to the moderate-intensity exercise.  Know your numbers.Ask your health care provider to check your cholesterol and your blood  glucose. Continue to have your blood tested as directed by your health care provider.  What should I know about cancer screening? There are several types of cancer. Take the following steps to reduce your risk and to catch any cancer development as early as possible. Breast  Cancer  Practice breast self-awareness. ? This means understanding how your breasts normally appear and feel. ? It also means doing regular breast self-exams. Let your health care provider know about any changes, no matter how small.  If you are 13 or older, have a clinician do a breast exam (clinical breast exam or CBE) every year. Depending on your age, family history, and medical history, it may be recommended that you also have a yearly breast X-ray (mammogram).  If you have a family history of breast cancer, talk with your health care provider about genetic screening.  If you are at high risk for breast cancer, talk with your health care provider about having an MRI and a mammogram every year.  Breast cancer (BRCA) gene test is recommended for women who have family members with BRCA-related cancers. Results of the assessment will determine the need for genetic counseling and BRCA1 and for BRCA2 testing. BRCA-related cancers include these types: ? Breast. This occurs in males or females. ? Ovarian. ? Tubal. This may also be called fallopian tube cancer. ? Cancer of the abdominal or pelvic lining (peritoneal cancer). ? Prostate. ? Pancreatic.  Cervical, Uterine, and Ovarian Cancer Your health care provider may recommend that you be screened regularly for cancer of the pelvic organs. These include your ovaries, uterus, and vagina. This screening involves a pelvic exam, which includes checking for microscopic changes to the surface of your cervix (Pap test).  For women ages 21-65, health care providers may recommend a pelvic exam and a Pap test every three years. For women ages 59-65, they may recommend the Pap test and pelvic exam, combined with testing for human papilloma virus (HPV), every five years. Some types of HPV increase your risk of cervical cancer. Testing for HPV may also be done on women of any age who have unclear Pap test results.  Other health care providers may not  recommend any screening for nonpregnant women who are considered low risk for pelvic cancer and have no symptoms. Ask your health care provider if a screening pelvic exam is right for you.  If you have had past treatment for cervical cancer or a condition that could lead to cancer, you need Pap tests and screening for cancer for at least 20 years after your treatment. If Pap tests have been discontinued for you, your risk factors (such as having a new sexual partner) need to be reassessed to determine if you should start having screenings again. Some women have medical problems that increase the chance of getting cervical cancer. In these cases, your health care provider may recommend that you have screening and Pap tests more often.  If you have a family history of uterine cancer or ovarian cancer, talk with your health care provider about genetic screening.  If you have vaginal bleeding after reaching menopause, tell your health care provider.  There are currently no reliable tests available to screen for ovarian cancer.  Lung Cancer Lung cancer screening is recommended for adults 1-65 years old who are at high risk for lung cancer because of a history of smoking. A yearly low-dose CT scan of the lungs is recommended if you:  Currently smoke.  Have a history of at  least 30 pack-years of smoking and you currently smoke or have quit within the past 15 years. A pack-year is smoking an average of one pack of cigarettes per day for one year.  Yearly screening should:  Continue until it has been 15 years since you quit.  Stop if you develop a health problem that would prevent you from having lung cancer treatment.  Colorectal Cancer  This type of cancer can be detected and can often be prevented.  Routine colorectal cancer screening usually begins at age 9 and continues through age 64.  If you have risk factors for colon cancer, your health care provider may recommend that you be screened  at an earlier age.  If you have a family history of colorectal cancer, talk with your health care provider about genetic screening.  Your health care provider may also recommend using home test kits to check for hidden blood in your stool.  A small camera at the end of a tube can be used to examine your colon directly (sigmoidoscopy or colonoscopy). This is done to check for the earliest forms of colorectal cancer.  Direct examination of the colon should be repeated every 5-10 years until age 88. However, if early forms of precancerous polyps or small growths are found or if you have a family history or genetic risk for colorectal cancer, you may need to be screened more often.  Skin Cancer  Check your skin from head to toe regularly.  Monitor any moles. Be sure to tell your health care provider: ? About any new moles or changes in moles, especially if there is a change in a mole's shape or color. ? If you have a mole that is larger than the size of a pencil eraser.  If any of your family members has a history of skin cancer, especially at a young age, talk with your health care provider about genetic screening.  Always use sunscreen. Apply sunscreen liberally and repeatedly throughout the day.  Whenever you are outside, protect yourself by wearing long sleeves, pants, a wide-brimmed hat, and sunglasses.  What should I know about osteoporosis? Osteoporosis is a condition in which bone destruction happens more quickly than new bone creation. After menopause, you may be at an increased risk for osteoporosis. To help prevent osteoporosis or the bone fractures that can happen because of osteoporosis, the following is recommended:  If you are 28-78 years old, get at least 1,000 mg of calcium and at least 600 mg of vitamin D per day.  If you are older than age 51 but younger than age 61, get at least 1,200 mg of calcium and at least 600 mg of vitamin D per day.  If you are older than age 71,  get at least 1,200 mg of calcium and at least 800 mg of vitamin D per day.  Smoking and excessive alcohol intake increase the risk of osteoporosis. Eat foods that are rich in calcium and vitamin D, and do weight-bearing exercises several times each week as directed by your health care provider. What should I know about how menopause affects my mental health? Depression may occur at any age, but it is more common as you become older. Common symptoms of depression include:  Low or sad mood.  Changes in sleep patterns.  Changes in appetite or eating patterns.  Feeling an overall lack of motivation or enjoyment of activities that you previously enjoyed.  Frequent crying spells.  Talk with your health care provider if you  think that you are experiencing depression. What should I know about immunizations? It is important that you get and maintain your immunizations. These include:  Tetanus, diphtheria, and pertussis (Tdap) booster vaccine.  Influenza every year before the flu season begins.  Pneumonia vaccine.  Shingles vaccine.  Your health care provider may also recommend other immunizations. This information is not intended to replace advice given to you by your health care provider. Make sure you discuss any questions you have with your health care provider. Document Released: 01/04/2006 Document Revised: 06/01/2016 Document Reviewed: 08/16/2015 Elsevier Interactive Patient Education  2018 [employer].

## 2017-12-30 NOTE — Assessment & Plan Note (Signed)
Advised to increase water intake in the AM.

## 2017-12-30 NOTE — Assessment & Plan Note (Addendum)
There was no progression of mild placque formation by 2017 carotid doppler.  Continue annual  follow up is not necessary 

## 2018-01-03 ENCOUNTER — Other Ambulatory Visit: Payer: Self-pay

## 2018-01-03 MED ORDER — SIMVASTATIN 40 MG PO TABS: ORAL_TABLET | ORAL | 5 refills | Status: DC

## 2018-01-03 MED ORDER — LEVOTHYROXINE SODIUM 75 MCG PO TABS: ORAL_TABLET | ORAL | 1 refills | Status: DC

## 2018-03-13 ENCOUNTER — Ambulatory Visit
Admission: RE | Admit: 2018-03-13 | Discharge: 2018-03-13 | Disposition: A | Discharge status: Home / Self Care | Payer: Medicare Other | Source: Ambulatory Visit | Attending: Internal Medicine | Admitting: Internal Medicine

## 2018-03-13 DIAGNOSIS — Z1231 Encounter for screening mammogram for malignant neoplasm of breast: Secondary | ICD-10-CM | POA: Diagnosis not present

## 2018-03-13 DIAGNOSIS — Z1239 Encounter for other screening for malignant neoplasm of breast: Secondary | ICD-10-CM

## 2018-04-30 ENCOUNTER — Encounter: Payer: Self-pay | Admitting: Internal Medicine

## 2018-04-30 DIAGNOSIS — M19041 Primary osteoarthritis, right hand: Secondary | ICD-10-CM

## 2018-04-30 DIAGNOSIS — M19042 Primary osteoarthritis, left hand: Principal | ICD-10-CM

## 2018-05-13 ENCOUNTER — Encounter: Payer: Self-pay | Admitting: Occupational Therapy

## 2018-05-13 ENCOUNTER — Ambulatory Visit: Payer: Medicare Other | Attending: Internal Medicine | Admitting: Occupational Therapy

## 2018-05-13 DIAGNOSIS — M25542 Pain in joints of left hand: Secondary | ICD-10-CM

## 2018-05-13 DIAGNOSIS — M25541 Pain in joints of right hand: Secondary | ICD-10-CM

## 2018-05-13 DIAGNOSIS — M65352 Trigger finger, left little finger: Secondary | ICD-10-CM | POA: Diagnosis not present

## 2018-05-13 DIAGNOSIS — M6281 Muscle weakness (generalized): Secondary | ICD-10-CM

## 2018-05-13 DIAGNOSIS — M25642 Stiffness of left hand, not elsewhere classified: Secondary | ICD-10-CM | POA: Diagnosis not present

## 2018-05-13 DIAGNOSIS — R278 Other lack of coordination: Secondary | ICD-10-CM

## 2018-05-13 DIAGNOSIS — M25641 Stiffness of right hand, not elsewhere classified: Secondary | ICD-10-CM
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
## 2018-05-23 ENCOUNTER — Ambulatory Visit: Payer: Medicare Other | Admitting: Occupational Therapy

## 2018-05-23 ENCOUNTER — Encounter: Payer: Self-pay | Admitting: Occupational Therapy

## 2018-05-23 DIAGNOSIS — M6281 Muscle weakness (generalized): Secondary | ICD-10-CM | POA: Diagnosis not present

## 2018-05-23 DIAGNOSIS — M65352 Trigger finger, left little finger: Secondary | ICD-10-CM

## 2018-05-23 DIAGNOSIS — M25641 Stiffness of right hand, not elsewhere classified: Secondary | ICD-10-CM | POA: Diagnosis not present

## 2018-05-23 DIAGNOSIS — M25542 Pain in joints of left hand: Secondary | ICD-10-CM | POA: Diagnosis not present

## 2018-05-23 DIAGNOSIS — M25541 Pain in joints of right hand: Secondary | ICD-10-CM | POA: Diagnosis not present

## 2018-05-23 DIAGNOSIS — R278 Other lack of coordination: Secondary | ICD-10-CM

## 2018-05-23 DIAGNOSIS — M25642 Stiffness of left hand, not elsewhere classified: Secondary | ICD-10-CM
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
## 2018-05-27 ENCOUNTER — Other Ambulatory Visit: Payer: Self-pay | Admitting: Internal Medicine

## 2018-05-30 ENCOUNTER — Ambulatory Visit: Payer: Medicare Other | Admitting: Occupational Therapy

## 2018-07-02 ENCOUNTER — Encounter: Payer: Self-pay | Admitting: Internal Medicine

## 2018-07-02 ENCOUNTER — Ambulatory Visit (INDEPENDENT_AMBULATORY_CARE_PROVIDER_SITE_OTHER): Payer: Medicare Other | Admitting: Internal Medicine

## 2018-07-02 VITALS — BP 144/82 | HR 63 | Temp 97.8°F | Resp 14 | Ht 62.5 in | Wt 125.2 lb

## 2018-07-02 DIAGNOSIS — I6523 Occlusion and stenosis of bilateral carotid arteries: Secondary | ICD-10-CM

## 2018-07-02 DIAGNOSIS — R419 Unspecified symptoms and signs involving cognitive functions and awareness: Secondary | ICD-10-CM

## 2018-07-02 DIAGNOSIS — Z1211 Encounter for screening for malignant neoplasm of colon: Secondary | ICD-10-CM

## 2018-07-02 DIAGNOSIS — E782 Mixed hyperlipidemia: Secondary | ICD-10-CM

## 2018-07-02 DIAGNOSIS — R030 Elevated blood-pressure reading, without diagnosis of hypertension: Secondary | ICD-10-CM

## 2018-07-02 DIAGNOSIS — E034 Atrophy of thyroid (acquired): Secondary | ICD-10-CM | POA: Diagnosis not present

## 2018-07-02 LAB — COMPREHENSIVE METABOLIC PANEL
ALT: 15 U/L (ref 0–35)
AST: 20 U/L (ref 0–37)
Albumin: 4.4 g/dL (ref 3.5–5.2)
Alkaline Phosphatase: 46 U/L (ref 39–117)
BUN: 20 mg/dL (ref 6–23)
CO2: 30 mEq/L (ref 19–32)
Calcium: 10 mg/dL (ref 8.4–10.5)
Chloride: 104 mEq/L (ref 96–112)
Creatinine, Ser: 0.76 mg/dL (ref 0.40–1.20)
GFR: 78.79 mL/min (ref >60.00)
Glucose, Bld: 88 mg/dL (ref 70–99)
Potassium: 3.9 mEq/L (ref 3.5–5.1)
Sodium: 140 mEq/L (ref 135–145)
Total Bilirubin: 0.5 mg/dL (ref 0.2–1.2)
Total Protein: 7.2 g/dL (ref 6.0–8.3)

## 2018-07-02 LAB — MICROALBUMIN / CREATININE URINE RATIO
Creatinine,U: 31.8 mg/dL
Microalb Creat Ratio: 2.4 mg/g (ref 0.0–30.0)
Microalb, Ur: 0.8 mg/dL (ref 0.0–1.9)

## 2018-07-02 LAB — TSH: TSH: 1.32 u[IU]/mL (ref 0.35–4.50)

## 2018-07-02 MED ORDER — SCOPOLAMINE 1 MG/3DAYS TD PT72: 1.0000 | MEDICATED_PATCH | TRANSDERMAL | 0 refills | Status: DC

## 2018-07-02 NOTE — Patient Instructions (Signed)
your blood pressure is elevated today.  Please check your BP at home once a day for a few days,  And then visit the RN at [facility]  To see if your home machine  Is accurate (take it with you).  If the majority of your readings are  over 130/80,  We should start medication for hypertension.

## 2018-07-02 NOTE — Progress Notes (Signed)
Subjective:  Patient ID: Raven [name], female    DOB: 17-Nov-1941  Age: 77 y.o. MRN: [id number]  CC: The primary encounter diagnosis was Mixed hyperlipidemia. Diagnoses of Hypothyroidism due to acquired atrophy of thyroid, Elevated blood-pressure reading, without diagnosis of hypertension, Cognitive complaints with normal neuropsychological exam, and Screening for colon cancer were also pertinent to this visit.  HPI Raven [name] presents for follow up on hypothyroidism  and hyperlipidemia  Still noting some memory issues regarding details and dates.  Using  post it notes.  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  drinking Less caffeine,  Less use of I phone at night  Never filled the alprazolam   Exercising regularly along with yoga :  Core , yoga,  tai chi and weight lifting [facility].  Some patellar  knee pain described as sharp,  Occurs only  During exercise ,,  Does not do lunges.   Going on a cruise of the [place]  a few weeks . Not sure if she gets motion sickness on boats.   Outpatient Medications Prior to Visit  Medication Sig Dispense Refill  . aspirin-acetaminophen-caffeine (EXCEDRIN MIGRAINE) 250-250-65 MG per tablet Take 1 tablet by mouth every 6 (six) hours as needed for headache.    . Biotin 2500 MCG CAPS Take by mouth.    . calcium-vitamin D (OSCAL WITH D) 500-200 MG-UNIT per tablet Take 1 tablet by mouth daily with breakfast.    . folic acid (FOLVITE) 081 MCG tablet Take 400 mcg by mouth daily.    Marland Kitchen ibuprofen (ADVIL,MOTRIN) 200 MG tablet Take 200-400 mg by mouth every 6 (six) hours as needed for mild pain or moderate pain.    Marland Kitchen levothyroxine (SYNTHROID, LEVOTHROID) 75 MCG tablet TAKE 1 TABLET BY MOUTH DAILY BEFORE BREAKFAST 90 tablet 0  . Multiple Vitamin (MULTIVITAMIN WITH MINERALS) TABS tablet Take 1 tablet by mouth daily.    . simvastatin  (ZOCOR) 40 MG tablet TAKE 1 TABLET BY MOUTH EVERYDAY AT BEDTIME 30 tablet 5  . ALPRAZolam (XANAX) 0.25 MG tablet Take 1 tablet (0.25 mg total) by mouth at bedtime as needed for anxiety or sleep. (Patient not taking: Reported on 05/13/2018) 30 tablet 0   No facility-administered medications prior to visit.     Review of Systems;  Patient denies headache, fevers, malaise, unintentional weight loss, skin rash, eye pain, sinus congestion and sinus pain, sore throat, dysphagia,  hemoptysis , cough, dyspnea, wheezing, chest pain, palpitations, orthopnea, edema, abdominal pain, nausea, melena, diarrhea, constipation, flank pain, dysuria, hematuria, urinary  Frequency, nocturia, numbness, tingling, seizures,  Focal weakness, Loss of consciousness,  Tremor, insomnia, depression, anxiety, and suicidal ideation.      Objective:  BP (!) 144/82 (BP Location: Left Arm, Patient Position: Sitting, Cuff Size: Normal)   Pulse 63   Temp 97.8 F (36.6 C) (Oral)   Resp 14   Ht 5' 2.5" (1.588 m)   Wt 125 lb 3.2 oz (56.8 kg)   SpO2 97%   BMI 22.53 kg/m   BP Readings from Last 3 Encounters:  07/02/18 (!) 144/82  12/30/17 130/84  06/27/17 130/82    Wt Readings from Last 3 Encounters:  07/02/18 125 lb 3.2 oz (56.8 kg)  12/30/17 127 lb 3.2 oz (57.7 kg)  06/27/17 125 lb 12.8 oz (57.1 kg)    General appearance: alert, cooperative and appears stated  age Ears: normal TM's and external ear canals both ears Throat: lips, mucosa, and tongue normal; teeth and gums normal Neck: no adenopathy, no carotid bruit, supple, symmetrical, trachea midline and thyroid not enlarged, symmetric, no tenderness/mass/nodules Back: symmetric, no curvature. ROM normal. No CVA tenderness. Lungs: clear to auscultation bilaterally Heart: regular rate and rhythm, S1, S2 normal, no murmur, click, rub or gallop Abdomen: soft, non-tender; bowel sounds normal; no masses,  no organomegaly Pulses: 2+ and symmetric Skin: Skin color,  texture, turgor normal. No rashes or lesions Lymph nodes: Cervical, supraclavicular, and axillary nodes normal.  Lab Results  Component Value Date   HGBA1C 5.7 01/19/2015    Lab Results  Component Value Date   CREATININE 0.76 07/02/2018   CREATININE 0.77 12/30/2017   CREATININE 0.79 06/27/2017    Lab Results  Component Value Date   WBC 5.1 12/30/2017   HGB 13.1 12/30/2017   HCT 38.2 12/30/2017   PLT 207.0 12/30/2017   GLUCOSE 88 07/02/2018   CHOL 183 12/30/2017   TRIG 53.0 12/30/2017   HDL 89.90 12/30/2017   LDLDIRECT 163.0 10/14/2013   LDLCALC 82 12/30/2017   ALT 15 07/02/2018   AST 20 07/02/2018   NA 140 07/02/2018   K 3.9 07/02/2018   CL 104 07/02/2018   CREATININE 0.76 07/02/2018   BUN 20 07/02/2018   CO2 30 07/02/2018   TSH 1.32 07/02/2018   INR 1.0 01/19/2015   HGBA1C 5.7 01/19/2015   MICROALBUR 0.8 07/02/2018    Mm Screening Breast Tomo Bilateral  Result Date: 03/13/2018 CLINICAL DATA:  Screening. EXAM: DIGITAL SCREENING BILATERAL MAMMOGRAM WITH TOMO AND CAD COMPARISON:  Previous exam(s). ACR Breast Density Category d: The breast tissue is extremely dense, which lowers the sensitivity of mammography. FINDINGS: There are no findings suspicious for malignancy. Images were processed with CAD. IMPRESSION: No mammographic evidence of malignancy. A result letter of this screening mammogram will be mailed directly to the patient. RECOMMENDATION: Screening mammogram in one year. (Code:SM-B-01Y) BI-RADS CATEGORY  1: Negative. Electronically Signed   By: Fidela Salisbury M.D.   On: 03/13/2018 14:07    Assessment & Plan:   Problem List Items Addressed This Visit    Screening for colon cancer    Done in 2014. By Vira Agar.  Still no report in chart. Reportedly normal except for internal hemorrhoids       Hyperlipidemia - Primary    Primary prevention with simvastatin given presence of renal artery and carotid artery stenoses . LDL is at goal of < 100  .  LFTs are  normal.  No changes today   Lab Results  Component Value Date   CHOL 183 12/30/2017   HDL 89.90 12/30/2017   LDLCALC 82 12/30/2017   LDLDIRECT 163.0 10/14/2013   TRIG 53.0 12/30/2017   CHOLHDL 2 12/30/2017        Relevant Orders   Comprehensive metabolic panel (Completed)   Hypothyroidism    Thyroid function is WNL on current dose.  No current changes needed.   Lab Results  Component Value Date   TSH 1.32 07/02/2018   .          Relevant Orders   TSH (Completed)   Cognitive complaints with normal neuropsychological exam    Her self reported symptoms are still present but somewhat improved with a change in her stress level.  MMSE is 28       Other Visit Diagnoses    Elevated blood-pressure reading, without diagnosis of hypertension  Relevant Orders   Microalbumin / creatinine urine ratio (Completed)      I have discontinued Raven [name]'s ALPRAZolam. I am also having her start on scopolamine. Additionally, I am having her maintain her calcium-vitamin D, multivitamin with minerals, aspirin-acetaminophen-caffeine, ibuprofen, Biotin, folic acid, simvastatin, and levothyroxine.  Meds ordered this encounter  Medications  . scopolamine (TRANSDERM-SCOP, 1.5 MG,) 1 MG/3DAYS    Sig: Place 1 patch (1.5 mg total) onto the skin every 3 (three) days.    Dispense:  4 patch    Refill:  0    Medications Discontinued During This Encounter  Medication Reason  . ALPRAZolam (XANAX) 0.25 MG tablet Prescription never filled    Follow-up: Return in about 6 months (around 01/02/2019) for CPE.   Crecencio Mc, MD

## 2018-07-05 NOTE — Assessment & Plan Note (Addendum)
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 LDL is at goal of < 100  .  LFTs are normal.  No changes today   Lab Results  Component Value Date   CHOL 183 12/30/2017   HDL 89.90 12/30/2017   LDLCALC 82 12/30/2017   LDLDIRECT 163.0 10/14/2013   TRIG 53.0 12/30/2017   CHOLHDL 2 12/30/2017

## 2018-07-05 NOTE — Assessment & Plan Note (Signed)
Done in 2014. By Vira Agar.  Still no report in chart. Reportedly normal except for internal hemorrhoids

## 2018-07-05 NOTE — Assessment & Plan Note (Addendum)
Her self reported symptoms are still present but somewhat improved with a change in her stress level.  MMSE is 28

## 2018-07-05 NOTE — Assessment & Plan Note (Signed)
Thyroid function is WNL on current dose.  No current changes needed.   Lab Results  Component Value Date   TSH 1.32 07/02/2018   .

## 2018-07-16 NOTE — Progress Notes (Signed)
Requested report electronically

## 2018-09-11 DIAGNOSIS — Z23 Encounter for immunization: Secondary | ICD-10-CM | POA: Diagnosis not present

## 2018-09-20 ENCOUNTER — Other Ambulatory Visit: Payer: Self-pay | Admitting: Internal Medicine

## 2018-09-25 DIAGNOSIS — M7071 Other bursitis of hip, right hip: Secondary | ICD-10-CM

## 2018-10-03 DIAGNOSIS — M25551 Pain in right hip: Secondary | ICD-10-CM | POA: Diagnosis not present

## 2018-10-08 DIAGNOSIS — M25551 Pain in right hip: Secondary | ICD-10-CM | POA: Diagnosis not present

## 2018-10-13 DIAGNOSIS — M25551 Pain in right hip: Secondary | ICD-10-CM | POA: Diagnosis not present

## 2018-10-16 DIAGNOSIS — M25551 Pain in right hip: Secondary | ICD-10-CM | POA: Diagnosis not present

## 2018-10-20 DIAGNOSIS — M25551 Pain in right hip: Secondary | ICD-10-CM | POA: Diagnosis not present

## 2018-10-27 DIAGNOSIS — M25551 Pain in right hip: Secondary | ICD-10-CM | POA: Diagnosis not present

## 2018-10-30 DIAGNOSIS — M25551 Pain in right hip: Secondary | ICD-10-CM | POA: Diagnosis not present

## 2018-11-03 DIAGNOSIS — M25551 Pain in right hip: Secondary | ICD-10-CM | POA: Diagnosis not present

## 2018-11-05 DIAGNOSIS — M25551 Pain in right hip: Secondary | ICD-10-CM | POA: Diagnosis not present

## 2018-12-19 ENCOUNTER — Other Ambulatory Visit: Payer: Self-pay | Admitting: Internal Medicine

## 2019-01-01 DIAGNOSIS — L821 Other seborrheic keratosis: Secondary | ICD-10-CM | POA: Diagnosis not present

## 2019-01-01 DIAGNOSIS — L812 Freckles: Secondary | ICD-10-CM | POA: Diagnosis not present

## 2019-01-01 DIAGNOSIS — I781 Nevus, non-neoplastic: Secondary | ICD-10-CM | POA: Diagnosis not present

## 2019-01-01 DIAGNOSIS — D229 Melanocytic nevi, unspecified: Secondary | ICD-10-CM | POA: Diagnosis not present

## 2019-01-01 DIAGNOSIS — D2230 Melanocytic nevi of unspecified part of face: Secondary | ICD-10-CM | POA: Diagnosis not present

## 2019-01-01 DIAGNOSIS — L570 Actinic keratosis: Secondary | ICD-10-CM | POA: Diagnosis not present

## 2019-01-01 DIAGNOSIS — D1800 Hemangioma unspecified site: Secondary | ICD-10-CM | POA: Diagnosis not present

## 2019-01-01 DIAGNOSIS — I8393 Asymptomatic varicose veins of bilateral lower extremities: Secondary | ICD-10-CM | POA: Diagnosis not present

## 2019-01-01 DIAGNOSIS — Z1283 Encounter for screening for malignant neoplasm of skin: Secondary | ICD-10-CM | POA: Diagnosis not present

## 2019-01-01 DIAGNOSIS — L578 Other skin changes due to chronic exposure to nonionizing radiation: Secondary | ICD-10-CM | POA: Diagnosis not present

## 2019-01-01 DIAGNOSIS — D225 Melanocytic nevi of trunk: Secondary | ICD-10-CM | POA: Diagnosis not present

## 2019-01-08 ENCOUNTER — Ambulatory Visit (INDEPENDENT_AMBULATORY_CARE_PROVIDER_SITE_OTHER): Payer: Medicare Other | Admitting: Internal Medicine

## 2019-01-08 ENCOUNTER — Ambulatory Visit (INDEPENDENT_AMBULATORY_CARE_PROVIDER_SITE_OTHER): Payer: Medicare Other

## 2019-01-08 ENCOUNTER — Encounter: Payer: Self-pay | Admitting: Internal Medicine

## 2019-01-08 VITALS — BP 122/84 | HR 68 | Temp 98.0°F | Resp 14 | Ht 62.5 in | Wt 123.0 lb

## 2019-01-08 DIAGNOSIS — M25551 Pain in right hip: Secondary | ICD-10-CM

## 2019-01-08 DIAGNOSIS — K047 Periapical abscess without sinus: Secondary | ICD-10-CM

## 2019-01-08 DIAGNOSIS — Z1211 Encounter for screening for malignant neoplasm of colon: Secondary | ICD-10-CM

## 2019-01-08 DIAGNOSIS — G8929 Other chronic pain: Secondary | ICD-10-CM

## 2019-01-08 DIAGNOSIS — I6529 Occlusion and stenosis of unspecified carotid artery: Secondary | ICD-10-CM

## 2019-01-08 DIAGNOSIS — Z1239 Encounter for other screening for malignant neoplasm of breast: Secondary | ICD-10-CM | POA: Diagnosis not present

## 2019-01-08 DIAGNOSIS — E034 Atrophy of thyroid (acquired): Secondary | ICD-10-CM | POA: Diagnosis not present

## 2019-01-08 DIAGNOSIS — Z0000 Encounter for general adult medical examination without abnormal findings: Secondary | ICD-10-CM

## 2019-01-08 DIAGNOSIS — E782 Mixed hyperlipidemia: Secondary | ICD-10-CM

## 2019-01-08 DIAGNOSIS — H269 Unspecified cataract: Secondary | ICD-10-CM | POA: Diagnosis not present

## 2019-01-08 LAB — COMPREHENSIVE METABOLIC PANEL
ALT: 17 U/L (ref 0–35)
AST: 23 U/L (ref 0–37)
Albumin: 4.5 g/dL (ref 3.5–5.2)
Alkaline Phosphatase: 50 U/L (ref 39–117)
BUN: 16 mg/dL (ref 6–23)
CO2: 30 mEq/L (ref 19–32)
Calcium: 9.7 mg/dL (ref 8.4–10.5)
Chloride: 104 mEq/L (ref 96–112)
Creatinine, Ser: 0.77 mg/dL (ref 0.40–1.20)
GFR: 72.92 mL/min (ref >60.00)
Glucose, Bld: 88 mg/dL (ref 70–99)
Potassium: 4 mEq/L (ref 3.5–5.1)
Sodium: 142 mEq/L (ref 135–145)
Total Bilirubin: 0.5 mg/dL (ref 0.2–1.2)
Total Protein: 7 g/dL (ref 6.0–8.3)

## 2019-01-08 LAB — LIPID PANEL
Cholesterol: 199 mg/dL (ref 0–200)
HDL: 85.2 mg/dL (ref >39.00)
LDL Cholesterol: 101 mg/dL — ABNORMAL HIGH (ref 0–99)
NonHDL: 113.94
Total CHOL/HDL Ratio: 2
Triglycerides: 63 mg/dL (ref 0.0–149.0)
VLDL: 12.6 mg/dL (ref 0.0–40.0)

## 2019-01-08 LAB — TSH: TSH: 0.66 u[IU]/mL (ref 0.35–4.50)

## 2019-01-08 MED ORDER — AMOXICILLIN-POT CLAVULANATE 875-125 MG PO TABS: 1.0000 | ORAL_TABLET | Freq: Two times a day (BID) | ORAL | 0 refills | Status: DC

## 2019-01-08 NOTE — Patient Instructions (Addendum)
  Raven [name] , Thank you for taking time to come for your Medicare Wellness Visit. I appreciate your ongoing commitment to your health goals. Please review the following plan we discussed and let me know if I can assist you in the future.   Labs ans Xray as directed  These are the goals we discussed: Goals      Patient Stated   . Maintain Healthy Lifestyle (pt-stated)       This is a list of the screening recommended for you and due dates:  Health Maintenance  Topic Date Due  . Mammogram  03/14/2019  . Colon Cancer Screening  11/04/2023  . Tetanus Vaccine  06/01/2024  . Flu Shot  Completed  . DEXA scan (bone density measurement)  Completed  . Pneumonia vaccines  Completed

## 2019-01-08 NOTE — Progress Notes (Signed)
Patient ID: Raven [name], female    DOB: 02-16-1941  Age: 78 y.o. MRN: [id number]  The patient is here for follow up and management of other chronic and acute problems.   The risk factors are reflected in the social history. Ed  [employer] of all physicians providing medical care to patient - is listed in the Snapshot section of the chart.  Activities of daily living:  The patient is 100% independent in all ADLs: dressing, toileting, feeding as well as independent mobility  Home safety : The patient has smoke detectors in the home. They wear seatbelts.  There are no firearms at home. There is no violence in the home.   There is no risks for hepatitis, STDs or HIV. There is no   history of blood transfusion. They have no travel history to infectious disease endemic areas of the world.  The patient has seen their dentist in the last six month. They have seen their eye doctor in the last year. They admit to slight hearing difficulty with regard to whispered voices and some television programs.  They have deferred audiologic testing in the last year.  They do not  have excessive sun exposure. Discussed the need for sun protection: hats, long sleeves and use of sunscreen if there is significant sun exposure.   Diet: the importance of a healthy diet is discussed. They do have a healthy diet.  The benefits of regular aerobic exercise were discussed. She walks 4 times per week ,  20 minutes.   Depression screen: there are no signs or vegative symptoms of depression- irritability, change in appetite, anhedonia, sadness/tearfullness.  Cognitive assessment: the patient manages all their financial and personal affairs and is actively engaged. They could relate day,date,year and events; recalled 2/3 objects at 3 minutes; performed clock-face test normally.  The following portions of the patient's history were reviewed and updated as appropriate: allergies, current medications, past family history, past  medical history,  past surgical history, past social history  and problem list.  Visual acuity was not assessed per patient preference since she has regular follow up with her ophthalmologist. Hearing and body mass index were assessed and reviewed.   During the course of the visit the patient was educated and counseled about appropriate screening and preventive services including : fall prevention , diabetes screening, nutrition counseling, colorectal cancer screening, and recommended immunizations.    CC: The primary encounter diagnosis was Hypothyroidism due to acquired atrophy of thyroid. Diagnoses of Mixed hyperlipidemia, Chronic hip pain, right, Cataract of both eyes, unspecified cataract type, Chronic right hip pain, Abscessed tooth, Stenosis of carotid artery, unspecified laterality, Screening for colon cancer, and Screening for breast cancer were also pertinent to this visit.  Had PT for right hip pain at [facility]  The PT  helped Has been doing yoga since last spring , now going 4 times per week. Pain aggravated by sitting and driving , no longer after 15 minute drive.  New car may be helping.  Using NSAIDs prn which also helps   Diagnosed with an abscess left maxillary bone , planned tooth extraction Feb 27 . Asymptomatic.  No antibiotics given except an abx oral rinse     History Raven [name] has a past medical history of Arthritis, Carotid artery occlusion, Fracture (Sep 11 2008), Headache(784.0), Hyperlipidemia, Hypothyroidism, Idiopathic parathyroidism ([place]), Primary hyperparathyroidism ([place]), and Thyroid disease.   She has a past surgical history that includes Carpal tunnel release (2000); Cesarean section; Rotator cuff repair (2013);  and Parathyroidectomy (Left, 03/23/2014).   Her family history includes Alzheimer's disease in her mother; Arthritis in an other family member; Dementia in her maternal grandmother and mother; Heart attack in her father and mother; Heart disease in her  father; Heart failure in her father.She reports that she has never smoked. She has never used smokeless tobacco. She reports current alcohol use of about 7.0 standard drinks of alcohol per week. She reports that she does not use drugs.  Outpatient Medications Prior to Visit  Medication Sig Dispense Refill  . aspirin-acetaminophen-caffeine (EXCEDRIN MIGRAINE) 250-250-65 MG per tablet Take 1 tablet by mouth every 6 (six) hours as needed for headache.    . Biotin 2500 MCG CAPS Take by mouth.    . calcium-vitamin D (OSCAL WITH D) 500-200 MG-UNIT per tablet Take 1 tablet by mouth daily with breakfast.    . folic acid (FOLVITE) 528 MCG tablet Take 400 mcg by mouth daily.    Marland Kitchen ibuprofen (ADVIL,MOTRIN) 200 MG tablet Take 200-400 mg by mouth every 6 (six) hours as needed for mild pain or moderate pain.    Marland Kitchen levothyroxine (SYNTHROID, LEVOTHROID) 75 MCG tablet TAKE 1 TAB BY MOUTH ONCE DAILY. TAKE ON AN EMPTY STOMACH WITH A GLASSOF WATER ATLEAST 30-60 MINUTES BEFORE BREAKFAST 90 tablet 0  . Multiple Vitamin (MULTIVITAMIN WITH MINERALS) TABS tablet Take 1 tablet by mouth daily.    . simvastatin (ZOCOR) 40 MG tablet TAKE 1 TABLET BY MOUTH EVERY DAY AT BEDTIME 90 tablet 1  . scopolamine (TRANSDERM-SCOP, 1.5 MG,) 1 MG/3DAYS Place 1 patch (1.5 mg total) onto the skin every 3 (three) days. 4 patch 0   No facility-administered medications prior to visit.     Review of Systems   Patient denies headache, fevers, malaise, unintentional weight loss, skin rash, eye pain, sinus congestion and sinus pain, sore throat, dysphagia,  hemoptysis , cough, dyspnea, wheezing, chest pain, palpitations, orthopnea, edema, abdominal pain, nausea, melena, diarrhea, constipation, flank pain, dysuria, hematuria, urinary  Frequency, nocturia, numbness, tingling, seizures,  Focal weakness, Loss of consciousness,  Tremor, insomnia, depression, anxiety, and suicidal ideation.     Objective:  BP 122/84 (BP Location: Left Arm, Patient  Position: Sitting, Cuff Size: Normal)   Pulse 68   Temp 98 F (36.7 C) (Oral)   Resp 14   Ht 5' 2.5" (1.588 m)   Wt 123 lb (55.8 kg)   SpO2 97%   BMI 22.14 kg/m   Physical Exam   General appearance: alert, cooperative and appears stated age Head: Normocephalic, without obvious abnormality, atraumatic Eyes: conjunctivae/corneas clear. PERRL, EOM's intact. Fundi benign. Ears: normal TM's and external ear canals both ears Nose: Nares normal. Septum midline. Mucosa normal. No drainage or sinus tenderness. Throat: lips, mucosa, and tongue normal; teeth and gums normal Neck: no adenopathy, no carotid bruit, no JVD, supple, symmetrical, trachea midline and thyroid not enlarged, symmetric, no tenderness/mass/nodules Lungs: clear to auscultation bilaterally Breasts: normal appearance, no masses or tenderness Heart: regular rate and rhythm, S1, S2 normal, no murmur, click, rub or gallop Abdomen: soft, non-tender; bowel sounds normal; no masses,  no organomegaly Extremities: extremities normal, atraumatic, no cyanosis or edema Pulses: 2+ and symmetric Skin: Skin color, texture, turgor normal. No rashes or lesions Neurologic: Alert and oriented X 3, normal strength and tone. Normal symmetric reflexes. Normal coordination and gait.      Assessment & Plan:   Problem List Items Addressed This Visit    Screening for colon cancer    Done in  2014. By Vira Agar.  normal except for internal hemorrhoids . 10y yr follow up in 2024      Screening for breast cancer    Annual breast exam done and annual mammogram ordered       Hypothyroidism - Primary    Thyroid function is WNL on current dose.  No current changes needed.   Lab Results  Component Value Date   TSH 0.66 01/08/2019         Relevant Orders   TSH (Completed)   Hyperlipidemia   Relevant Orders   Lipid panel (Completed)   Comprehensive metabolic panel (Completed)   Chronic right hip pain    Improved with PT and NSAIDs  Renal  function normal.  Repeat films of hip are negative for DJD  Lab Results  Component Value Date   CREATININE 0.77 01/08/2019          Cataracts, bilateral   Carotid stenosis    There was no progression of mild placque formation by 2017 carotid doppler.  Continue annual  follow up is not necessary      Abscessed tooth    Patient given augmentin to start the day prior to the planned abstraction.  Daily use of a probiotic advised for 3 weeks.        Other Visit Diagnoses    Chronic hip pain, right       Relevant Orders   DG Hip Unilat W OR W/O Pelvis 2-3 Views Right (Completed)     A total of 40 minutes was spent with patient more than half of which was spent in counseling patient on the above mentioned issues , reviewing and explaining recent labs and imaging studies done, and coordination of care.  I have discontinued Kriston S. Jamil's scopolamine. I am also having her start on amoxicillin-clavulanate. Additionally, I am having her maintain her calcium-vitamin D, multivitamin with minerals, aspirin-acetaminophen-caffeine, ibuprofen, Biotin, folic acid, simvastatin, and levothyroxine.  Meds ordered this encounter  Medications  . amoxicillin-clavulanate (AUGMENTIN) 875-125 MG tablet    Sig: Take 1 tablet by mouth 2 (two) times daily.    Dispense:  14 tablet    Refill:  0    Medications Discontinued During This Encounter  Medication Reason  . scopolamine (TRANSDERM-SCOP, 1.5 MG,) 1 MG/3DAYS     Follow-up: Return in about 6 months (around 07/09/2019).   Crecencio Mc, MD
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
## 2019-01-08 NOTE — Patient Instructions (Addendum)
You can use aleve twice daily as needed for hip pain   You can use up to 2000 mg of acetominophen (tylenol) every day safely  In divided doses (500 mg every 6 hours  Or 1000 mg every 12 hours.)   Start the amoxicillin (augmentin) one day prior to your tooth extraction    Take a probiotic for 3 weeks!!      Health Maintenance for Postmenopausal Women Menopause is a normal process in which your reproductive ability comes to an end. This process happens gradually over a span of months to years, usually between the ages of 39 and 53. Menopause is complete when you have missed 12 consecutive menstrual periods. It is important to talk with your health care provider about some of the most common conditions that affect postmenopausal women, such as heart disease, cancer, and bone loss (osteoporosis). Adopting a healthy lifestyle and getting preventive care can help to promote your health and wellness. Those actions can also lower your chances of developing some of these common conditions. What should I know about menopause? During menopause, you may experience a number of symptoms, such as:  Moderate-to-severe hot flashes.  Night sweats.  Decrease in sex drive.  Mood swings.  Headaches.  Tiredness.  Irritability.  Memory problems.  Insomnia. Choosing to treat or not to treat menopausal changes is an individual decision that you make with your health care provider. What should I know about hormone replacement therapy and supplements? Hormone therapy products are effective for treating symptoms that are associated with menopause, such as hot flashes and night sweats. Hormone replacement carries certain risks, especially as you become older. If you are thinking about using estrogen or estrogen with progestin treatments, discuss the benefits and risks with your health care provider. What should I know about heart disease and stroke? Heart disease, heart attack, and stroke become more  likely as you age. This may be due, in part, to the hormonal changes that your body experiences during menopause. These can affect how your body processes dietary fats, triglycerides, and cholesterol. Heart attack and stroke are both medical emergencies. There are many things that you can do to help prevent heart disease and stroke:  Have your blood pressure checked at least every 1-2 years. High blood pressure causes heart disease and increases the risk of stroke.  If you are 58-7 years old, ask your health care provider if you should take aspirin to prevent a heart attack or a stroke.  Do not use any tobacco products, including cigarettes, chewing tobacco, or electronic cigarettes. If you need help quitting, ask your health care provider.  It is important to eat a healthy diet and maintain a healthy weight. ? Be sure to include plenty of vegetables, fruits, low-fat dairy products, and lean protein. ? Avoid eating foods that are high in solid fats, added sugars, or salt (sodium).  Get regular exercise. This is one of the most important things that you can do for your health. ? Try to exercise for at least 150 minutes each week. The type of exercise that you do should increase your heart rate and make you sweat. This is known as moderate-intensity exercise. ? Try to do strengthening exercises at least twice each week. Do these in addition to the moderate-intensity exercise.  Know your numbers.Ask your health care provider to check your cholesterol and your blood glucose. Continue to have your blood tested as directed by your health care provider.  What should I know about cancer  screening? There are several types of cancer. Take the following steps to reduce your risk and to catch any cancer development as early as possible. Breast Cancer  Practice breast self-awareness. ? This means understanding how your breasts normally appear and feel. ? It also means doing regular breast self-exams.  Let your health care provider know about any changes, no matter how small.  If you are 64 or older, have a clinician do a breast exam (clinical breast exam or CBE) every year. Depending on your age, family history, and medical history, it may be recommended that you also have a yearly breast X-ray (mammogram).  If you have a family history of breast cancer, talk with your health care provider about genetic screening.  If you are at high risk for breast cancer, talk with your health care provider about having an MRI and a mammogram every year.  Breast cancer (BRCA) gene test is recommended for women who have family members with BRCA-related cancers. Results of the assessment will determine the need for genetic counseling and BRCA1 and for BRCA2 testing. BRCA-related cancers include these types: ? Breast. This occurs in males or females. ? Ovarian. ? Tubal. This may also be called fallopian tube cancer. ? Cancer of the abdominal or pelvic lining (peritoneal cancer). ? Prostate. ? Pancreatic. Cervical, Uterine, and Ovarian Cancer Your health care provider may recommend that you be screened regularly for cancer of the pelvic organs. These include your ovaries, uterus, and vagina. This screening involves a pelvic exam, which includes checking for microscopic changes to the surface of your cervix (Pap test).  For women ages 21-65, health care providers may recommend a pelvic exam and a Pap test every three years. For women ages 62-65, they may recommend the Pap test and pelvic exam, combined with testing for human papilloma virus (HPV), every five years. Some types of HPV increase your risk of cervical cancer. Testing for HPV may also be done on women of any age who have unclear Pap test results.  Other health care providers may not recommend any screening for nonpregnant women who are considered low risk for pelvic cancer and have no symptoms. Ask your health care provider if a screening pelvic exam  is right for you.  If you have had past treatment for cervical cancer or a condition that could lead to cancer, you need Pap tests and screening for cancer for at least 20 years after your treatment. If Pap tests have been discontinued for you, your risk factors (such as having a new sexual partner) need to be reassessed to determine if you should start having screenings again. Some women have medical problems that increase the chance of getting cervical cancer. In these cases, your health care provider may recommend that you have screening and Pap tests more often.  If you have a family history of uterine cancer or ovarian cancer, talk with your health care provider about genetic screening.  If you have vaginal bleeding after reaching menopause, tell your health care provider.  There are currently no reliable tests available to screen for ovarian cancer. Lung Cancer Lung cancer screening is recommended for adults 67-52 years old who are at high risk for lung cancer because of a history of smoking. A yearly low-dose CT scan of the lungs is recommended if you:  Currently smoke.  Have a history of at least 30 pack-years of smoking and you currently smoke or have quit within the past 15 years. A pack-year is smoking an average  of one pack of cigarettes per day for one year. Yearly screening should:  Continue until it has been 15 years since you quit.  Stop if you develop a health problem that would prevent you from having lung cancer treatment. Colorectal Cancer  This type of cancer can be detected and can often be prevented.  Routine colorectal cancer screening usually begins at age 71 and continues through age 69.  If you have risk factors for colon cancer, your health care provider may recommend that you be screened at an earlier age.  If you have a family history of colorectal cancer, talk with your health care provider about genetic screening.  Your health care provider may also  recommend using home test kits to check for hidden blood in your stool.  A small camera at the end of a tube can be used to examine your colon directly (sigmoidoscopy or colonoscopy). This is done to check for the earliest forms of colorectal cancer.  Direct examination of the colon should be repeated every 5-10 years until age 74. However, if early forms of precancerous polyps or small growths are found or if you have a family history or genetic risk for colorectal cancer, you may need to be screened more often. Skin Cancer  Check your skin from head to toe regularly.  Monitor any moles. Be sure to tell your health care provider: ? About any new moles or changes in moles, especially if there is a change in a mole's shape or color. ? If you have a mole that is larger than the size of a pencil eraser.  If any of your family members has a history of skin cancer, especially at a young age, talk with your health care provider about genetic screening.  Always use sunscreen. Apply sunscreen liberally and repeatedly throughout the day.  Whenever you are outside, protect yourself by wearing long sleeves, pants, a wide-brimmed hat, and sunglasses. What should I know about osteoporosis? Osteoporosis is a condition in which bone destruction happens more quickly than new bone creation. After menopause, you may be at an increased risk for osteoporosis. To help prevent osteoporosis or the bone fractures that can happen because of osteoporosis, the following is recommended:  If you are 61-72 years old, get at least 1,000 mg of calcium and at least 600 mg of vitamin D per day.  If you are older than age 81 but younger than age 45, get at least 1,200 mg of calcium and at least 600 mg of vitamin D per day.  If you are older than age 31, get at least 1,200 mg of calcium and at least 800 mg of vitamin D per day. Smoking and excessive alcohol intake increase the risk of osteoporosis. Eat foods that are rich in  calcium and vitamin D, and do weight-bearing exercises several times each week as directed by your health care provider. What should I know about how menopause affects my mental health? Depression may occur at any age, but it is more common as you become older. Common symptoms of depression include:  Low or sad mood.  Changes in sleep patterns.  Changes in appetite or eating patterns.  Feeling an overall lack of motivation or enjoyment of activities that you previously enjoyed.  Frequent crying spells. Talk with your health care provider if you think that you are experiencing depression. What should I know about immunizations? It is important that you get and maintain your immunizations. These include:  Tetanus, diphtheria, and pertussis (  Tdap) booster vaccine.  Influenza every year before the flu season begins.  Pneumonia vaccine.  Shingles vaccine. Your health care provider may also recommend other immunizations. This information is not intended to replace advice given to you by your health care provider. Make sure you discuss any questions you have with your health care provider. Document Released: 01/04/2006 Document Revised: 06/01/2016 Document Reviewed: 08/16/2015 Elsevier Interactive Patient Education  2019 [employer].

## 2019-01-10 DIAGNOSIS — K047 Periapical abscess without sinus: Secondary | ICD-10-CM | POA: Insufficient documentation

## 2019-01-10 NOTE — Assessment & Plan Note (Signed)
Patient given augmentin to start the day prior to the planned abstraction.  Daily use of a probiotic advised for 3 weeks.

## 2019-01-10 NOTE — Assessment & Plan Note (Signed)
Thyroid function is WNL on current dose.  No current changes needed.   Lab Results  Component Value Date   TSH 0.66 01/08/2019

## 2019-01-10 NOTE — Assessment & Plan Note (Addendum)
Done in 2014. By Vira Agar.  normal except for internal hemorrhoids . 10y yr follow up in 2024

## 2019-01-10 NOTE — Assessment & Plan Note (Addendum)
Improved with PT and NSAIDs  Renal function normal.  Repeat films of hip are negative for DJD  Lab Results  Component Value Date   CREATININE 0.77 01/08/2019

## 2019-01-10 NOTE — Assessment & Plan Note (Signed)
There was no progression of mild placque formation by 2017 carotid doppler.  Continue annual  follow up is not necessary

## 2019-01-10 NOTE — Assessment & Plan Note (Signed)
Annual breast exam done and annual mammogram ordered

## 2019-01-22 DIAGNOSIS — H2513 Age-related nuclear cataract, bilateral: Secondary | ICD-10-CM | POA: Diagnosis not present

## 2019-01-28 ENCOUNTER — Other Ambulatory Visit: Payer: Self-pay | Admitting: Internal Medicine

## 2019-01-28 DIAGNOSIS — Z1231 Encounter for screening mammogram for malignant neoplasm of breast: Secondary | ICD-10-CM

## 2019-03-14 ENCOUNTER — Other Ambulatory Visit: Payer: Self-pay | Admitting: Internal Medicine

## 2019-06-11 ENCOUNTER — Other Ambulatory Visit: Payer: Self-pay

## 2019-06-11 ENCOUNTER — Ambulatory Visit
Admission: RE | Admit: 2019-06-11 | Discharge: 2019-06-11 | Disposition: A | Discharge status: Home / Self Care | Payer: Medicare Other | Source: Ambulatory Visit | Attending: Internal Medicine | Admitting: Internal Medicine

## 2019-06-11 DIAGNOSIS — Z1231 Encounter for screening mammogram for malignant neoplasm of breast: Secondary | ICD-10-CM | POA: Insufficient documentation

## 2019-07-07 ENCOUNTER — Telehealth: Payer: Self-pay

## 2019-07-07 NOTE — Telephone Encounter (Signed)
Copied from [place] [id number]. Topic: General - Other >> Jul 07, 2019 11:17 AM Nils Flack wrote: Reason for CRM: pt would rather have a face to face appt  No answer at office

## 2019-07-07 NOTE — Telephone Encounter (Signed)
Patient wants appt on 07/09/19 to be an in-person visit.

## 2019-07-08 ENCOUNTER — Other Ambulatory Visit: Payer: Self-pay

## 2019-07-08 NOTE — Telephone Encounter (Signed)
Called pt and she stated that she had just gotten off the phone with Santiago Glad letting her know that she can do an in office appt tomrrow.

## 2019-07-09 ENCOUNTER — Other Ambulatory Visit: Payer: Self-pay

## 2019-07-09 ENCOUNTER — Ambulatory Visit (INDEPENDENT_AMBULATORY_CARE_PROVIDER_SITE_OTHER): Payer: Medicare Other | Admitting: Internal Medicine

## 2019-07-09 ENCOUNTER — Encounter: Payer: Self-pay | Admitting: Internal Medicine

## 2019-07-09 DIAGNOSIS — E782 Mixed hyperlipidemia: Secondary | ICD-10-CM

## 2019-07-09 DIAGNOSIS — E034 Atrophy of thyroid (acquired): Secondary | ICD-10-CM | POA: Diagnosis not present

## 2019-07-09 DIAGNOSIS — R030 Elevated blood-pressure reading, without diagnosis of hypertension: Secondary | ICD-10-CM

## 2019-07-09 DIAGNOSIS — I6529 Occlusion and stenosis of unspecified carotid artery: Secondary | ICD-10-CM | POA: Diagnosis not present

## 2019-07-09 LAB — COMPREHENSIVE METABOLIC PANEL
ALT: 18 U/L (ref 0–35)
AST: 24 U/L (ref 0–37)
Albumin: 4.5 g/dL (ref 3.5–5.2)
Alkaline Phosphatase: 50 U/L (ref 39–117)
BUN: 13 mg/dL (ref 6–23)
CO2: 28 mEq/L (ref 19–32)
Calcium: 9.9 mg/dL (ref 8.4–10.5)
Chloride: 104 mEq/L (ref 96–112)
Creatinine, Ser: 0.75 mg/dL (ref 0.40–1.20)
GFR: 75.07 mL/min (ref >60.00)
Glucose, Bld: 98 mg/dL (ref 70–99)
Potassium: 4.1 mEq/L (ref 3.5–5.1)
Sodium: 140 mEq/L (ref 135–145)
Total Bilirubin: 0.5 mg/dL (ref 0.2–1.2)
Total Protein: 6.9 g/dL (ref 6.0–8.3)

## 2019-07-09 LAB — LIPID PANEL
Cholesterol: 199 mg/dL (ref 0–200)
HDL: 80.1 mg/dL (ref >39.00)
LDL Cholesterol: 104 mg/dL — ABNORMAL HIGH (ref 0–99)
NonHDL: 118.76
Total CHOL/HDL Ratio: 2
Triglycerides: 76 mg/dL (ref 0.0–149.0)
VLDL: 15.2 mg/dL (ref 0.0–40.0)

## 2019-07-09 LAB — MICROALBUMIN / CREATININE URINE RATIO
Creatinine,U: 12.9 mg/dL
Microalb Creat Ratio: 5.4 mg/g (ref 0.0–30.0)
Microalb, Ur: <0.7 mg/dL (ref 0.0–1.9)

## 2019-07-09 LAB — TSH: TSH: 0.95 u[IU]/mL (ref 0.35–4.50)
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
## 2019-07-09 NOTE — Patient Instructions (Signed)
Your repeat BP was 130/80, which is borderline.  We'll keep an eye on it  Turmeric and glucosamine /chondroitin sulfate are available OTC to treat arthritis and are SAFE TO USE    Glucosamine tablets and capsules What is this medicine? GLUCOSAMINE (gloo KOH suh meen) is a dietary supplement. It is promoted to keep joints healthy and working smoothly. The FDA has not approved this supplement for any medical use. This supplement may be used for other purposes; ask your health care provider or pharmacist if you have questions. This medicine may be used for other purposes; ask your health care provider or pharmacist if you have questions. COMMON BRAND NAME(S): Genicin, OptiFlex-G What should I tell my health care provider before I take this medicine? They need to know if you have any of these conditions:  diabetes  kidney disease  liver disease  stomach or intestinal problems  an unusual or allergic reaction to glucosamine, other herbs, plants, medicines, foods, dyes, or preservatives  pregnant or trying to get pregnant  breast-feeding How should I use this medicine? Take this medicine by mouth with a glass of water. Follow the directions on the package labeling, or take as directed by your health care professional. Take your doses at regular intervals. If this medicine upsets your stomach, take it with food. Do not take this medicine more often than directed. Contact your pediatrician regarding the use of this medicine in children. Special care may be needed. Overdosage: If you think you have taken too much of this medicine contact a poison control center or emergency room at once. NOTE: This medicine is only for you. Do not share this medicine with others. What if I miss a dose? If you miss a dose, take it as soon as you can. If it is almost time for your next dose, take only that dose. Do not take double or extra doses. What may interact with this medicine?  warfarin This list may  not describe all possible interactions. Give your health care provider a list of all the medicines, herbs, non-prescription drugs, or dietary supplements you use. Also tell them if you smoke, drink alcohol, or use illegal drugs. Some items may interact with your medicine. What should I watch for while using this medicine? See your doctor if your symptoms do not get better or if they get worse. If you are scheduled for any medical or dental procedure, tell your healthcare provider that you are taking this medicine. You may need to stop taking this medicine before the procedure. Herbal or dietary supplements are not regulated like medicines. Rigid quality control standards are not required for dietary supplements. The purity and strength of these products can vary. The safety and effect of this dietary supplement for a certain disease or illness is not well known. This product is not intended to diagnose, treat, cure or prevent any disease. The Food and Drug Administration suggests the following to help consumers protect themselves:  Always read product labels and follow directions.  Natural does not mean a product is safe for humans to take.  Look for products that include USP after the ingredient name. This means that the manufacturer followed the standards of the [place] Pharmacopoeia.  Supplements made or sold by a nationally known food or drug company are more likely to be made under tight controls. You can write to the company for more information about how the product was made. What side effects may I notice from receiving this medicine? Side effects  that you should report to your doctor or health care professional as soon as possible:  allergic reactions like skin rash, itching or hives, swelling of the face, lips, or tongue  breathing problems  constipation or diarrhea  loss of appetite  stomach pain Side effects that usually do not require medical attention (report to your doctor or  health care professional if they continue or are bothersome):  gas  headache  nausea  stomach upset This list may not describe all possible side effects. Call your doctor for medical advice about side effects. You may report side effects to FDA at 1-800-FDA-1088. Where should I keep my medicine? Keep out of the reach of children. Store at room temperature or as directed on the package label. Protect from moisture. Throw away any unused medicine after the expiration date. NOTE: This sheet is a summary. It may not cover all possible information. If you have questions about this medicine, talk to your doctor, pharmacist, or health care provider.  2020 Elsevier/Gold Standard (2008-03-24 10:56:07)

## 2019-07-11 DIAGNOSIS — R030 Elevated blood-pressure reading, without diagnosis of hypertension: Secondary | ICD-10-CM | POA: Insufficient documentation

## 2019-07-11 NOTE — Assessment & Plan Note (Signed)
Thyroid function is WNL on current dose.  No current changes needed.   Lab Results  Component Value Date   TSH 0.95 07/09/2019

## 2019-07-11 NOTE — Assessment & Plan Note (Signed)
She  has no prior history of hypertension. She will check his blood pressure several times over the next 3-4 weeks and to submit readings for evaluation. Urine microalbumin to creatinine ratio done today is normal.

## 2019-07-11 NOTE — Assessment & Plan Note (Signed)
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 LDL is at goal of < 100  .  LFTs are normal.  No changes today   Lab Results  Component Value Date   CHOL 199 07/09/2019   HDL 80.10 07/09/2019   LDLCALC 104 (H) 07/09/2019   LDLDIRECT 163.0 10/14/2013   TRIG 76.0 07/09/2019   CHOLHDL 2 07/09/2019

## 2019-09-10 ENCOUNTER — Other Ambulatory Visit: Payer: Self-pay | Admitting: Internal Medicine

## 2019-11-02 DIAGNOSIS — L570 Actinic keratosis: Secondary | ICD-10-CM | POA: Diagnosis not present

## 2019-11-02 DIAGNOSIS — L821 Other seborrheic keratosis: Secondary | ICD-10-CM | POA: Diagnosis not present

## 2019-11-02 DIAGNOSIS — L820 Inflamed seborrheic keratosis: Secondary | ICD-10-CM | POA: Diagnosis not present

## 2019-12-08 DIAGNOSIS — Z23 Encounter for immunization: Secondary | ICD-10-CM | POA: Diagnosis not present

## 2020-01-05 DIAGNOSIS — E034 Atrophy of thyroid (acquired): Secondary | ICD-10-CM

## 2020-01-05 DIAGNOSIS — E782 Mixed hyperlipidemia: Secondary | ICD-10-CM

## 2020-01-05 DIAGNOSIS — Z23 Encounter for immunization: Secondary | ICD-10-CM | POA: Diagnosis not present

## 2020-01-06 NOTE — Telephone Encounter (Signed)
Spoke with pt and scheduled her for a fasting lab appt. Labs have been ordered.

## 2020-01-07 ENCOUNTER — Other Ambulatory Visit: Payer: Self-pay

## 2020-01-07 ENCOUNTER — Other Ambulatory Visit (INDEPENDENT_AMBULATORY_CARE_PROVIDER_SITE_OTHER): Payer: Medicare Other

## 2020-01-07 DIAGNOSIS — E782 Mixed hyperlipidemia: Secondary | ICD-10-CM | POA: Diagnosis not present

## 2020-01-07 DIAGNOSIS — E034 Atrophy of thyroid (acquired): Secondary | ICD-10-CM

## 2020-01-07 LAB — COMPREHENSIVE METABOLIC PANEL
ALT: 24 U/L (ref 0–35)
AST: 33 U/L (ref 0–37)
Albumin: 4.3 g/dL (ref 3.5–5.2)
Alkaline Phosphatase: 56 U/L (ref 39–117)
BUN: 20 mg/dL (ref 6–23)
CO2: 29 mEq/L (ref 19–32)
Calcium: 9.6 mg/dL (ref 8.4–10.5)
Chloride: 104 mEq/L (ref 96–112)
Creatinine, Ser: 0.78 mg/dL (ref 0.40–1.20)
GFR: 71.65 mL/min (ref >60.00)
Glucose, Bld: 89 mg/dL (ref 70–99)
Potassium: 3.8 mEq/L (ref 3.5–5.1)
Sodium: 139 mEq/L (ref 135–145)
Total Bilirubin: 0.5 mg/dL (ref 0.2–1.2)
Total Protein: 7.1 g/dL (ref 6.0–8.3)

## 2020-01-07 LAB — LIPID PANEL
Cholesterol: 192 mg/dL (ref 0–200)
HDL: 77.7 mg/dL (ref >39.00)
LDL Cholesterol: 101 mg/dL — ABNORMAL HIGH (ref 0–99)
NonHDL: 114.14
Total CHOL/HDL Ratio: 2
Triglycerides: 66 mg/dL (ref 0.0–149.0)
VLDL: 13.2 mg/dL (ref 0.0–40.0)

## 2020-01-07 LAB — TSH: TSH: 1.29 u[IU]/mL (ref 0.35–4.50)

## 2020-01-11 ENCOUNTER — Other Ambulatory Visit: Payer: Self-pay

## 2020-01-11 ENCOUNTER — Encounter: Payer: Self-pay | Admitting: Internal Medicine

## 2020-01-11 ENCOUNTER — Ambulatory Visit (INDEPENDENT_AMBULATORY_CARE_PROVIDER_SITE_OTHER): Payer: Medicare Other | Admitting: Internal Medicine

## 2020-01-11 ENCOUNTER — Ambulatory Visit: Payer: Medicare Other

## 2020-01-11 VITALS — BP 120/80 | Ht 62.5 in | Wt 125.0 lb

## 2020-01-11 DIAGNOSIS — R030 Elevated blood-pressure reading, without diagnosis of hypertension: Secondary | ICD-10-CM | POA: Diagnosis not present

## 2020-01-11 DIAGNOSIS — H3509 Other intraretinal microvascular abnormalities: Secondary | ICD-10-CM

## 2020-01-11 DIAGNOSIS — Z1231 Encounter for screening mammogram for malignant neoplasm of breast: Secondary | ICD-10-CM | POA: Diagnosis not present

## 2020-01-11 DIAGNOSIS — E034 Atrophy of thyroid (acquired): Secondary | ICD-10-CM

## 2020-01-11 DIAGNOSIS — I708 Atherosclerosis of other arteries: Secondary | ICD-10-CM | POA: Diagnosis not present

## 2020-01-11 DIAGNOSIS — Z780 Asymptomatic menopausal state: Secondary | ICD-10-CM

## 2020-01-11 NOTE — Assessment & Plan Note (Signed)
Thyroid function is WNL on current dose.  No current changes needed.   Lab Results  Component Value Date   TSH 1.29 01/07/2020

## 2020-01-11 NOTE — Assessment & Plan Note (Signed)
Simvastatin dose was increased for goal LDL < 100 by Dr Fletcher Anon.LDL is now < 100, and HDL remains high.  No changes today.  Lab Results  Component Value Date   CHOL 192 01/07/2020   HDL 77.70 01/07/2020   LDLCALC 101 (H) 01/07/2020   LDLDIRECT 163.0 10/14/2013   TRIG 66.0 01/07/2020   CHOLHDL 2 01/07/2020   Lab Results  Component Value Date   ALT 24 01/07/2020   AST 33 01/07/2020   ALKPHOS 56 01/07/2020   BILITOT 0.5 01/07/2020

## 2020-01-11 NOTE — Patient Instructions (Addendum)
Good to see you!  Glad you are well!    Your thyroid ,cholesterol,  liver and kidney function are normal.    We can repeat your thyroid test in 6 months if you prefer not to wait a year.   Colon cancer screening is due again when you are 79.    I'll order your DEXA scan  Your annual mammogram has been ordered and is due in July   You are encouraged (required) to call to make your appointment at [facility]   Health Maintenance After Age 77 After age 77, you are at a higher risk for certain long-term diseases and infections as well as injuries from falls. Falls are a major cause of broken bones and head injuries in people who are older than age 77. Getting regular preventive care can help to keep you healthy and well. Preventive care includes getting regular testing and making lifestyle changes as recommended by your health care provider. Talk with your health care provider about:  Which screenings and tests you should have. A screening is a test that checks for a disease when you have no symptoms.  A diet and exercise plan that is right for you. What should I know about screenings and tests to prevent falls? Screening and testing are the best ways to find a health problem early. Early diagnosis and treatment give you the best chance of managing medical conditions that are common after age 77. Certain conditions and lifestyle choices may make you more likely to have a fall. Your health care provider may recommend:  Regular vision checks. Poor vision and conditions such as cataracts can make you more likely to have a fall. If you wear glasses, make sure to get your prescription updated if your vision changes.  Medicine review. Work with your health care provider to regularly review all of the medicines you are taking, including over-the-counter medicines. Ask your health care provider about any side effects that may make you more likely to have a fall. Tell your health care provider if  any medicines that you take make you feel dizzy or sleepy.  Osteoporosis screening. Osteoporosis is a condition that causes the bones to get weaker. This can make the bones weak and cause them to break more easily.  Blood pressure screening. Blood pressure changes and medicines to control blood pressure can make you feel dizzy.  Strength and balance checks. Your health care provider may recommend certain tests to check your strength and balance while standing, walking, or changing positions.  Foot health exam. Foot pain and numbness, as well as not wearing proper footwear, can make you more likely to have a fall.  Depression screening. You may be more likely to have a fall if you have a fear of falling, feel emotionally low, or feel unable to do activities that you used to do.  Alcohol use screening. Using too much alcohol can affect your balance and may make you more likely to have a fall. What actions can I take to lower my risk of falls? General instructions  Talk with your health care provider about your risks for falling. Tell your health care provider if: ? You fall. Be sure to tell your health care provider about all falls, even ones that seem minor. ? You feel dizzy, sleepy, or off-balance.  Take over-the-counter and prescription medicines only as told by your health care provider. These include any supplements.  Eat a healthy diet and maintain a healthy weight. A  healthy diet includes low-fat dairy products, low-fat (lean) meats, and fiber from whole grains, beans, and lots of fruits and vegetables. Home safety  Remove any tripping hazards, such as rugs, cords, and clutter.  Install safety equipment such as grab bars in bathrooms and safety rails on stairs.  Keep rooms and walkways well-lit. Activity   Follow a regular exercise program to stay fit. This will help you maintain your balance. Ask your health care provider what types of exercise are appropriate for you.  If  you need a cane or walker, use it as recommended by your health care provider.  Wear supportive shoes that have nonskid soles. Lifestyle  Do not drink alcohol if your health care provider tells you not to drink.  If you drink alcohol, limit how much you have: ? 0-1 drink a day for women. ? 0-2 drinks a day for men.  Be aware of how much alcohol is in your drink. In the [place] one drink equals one typical bottle of beer (12 oz), one-half glass of wine (5 oz), or one shot of hard liquor (1 oz).  Do not use any products that contain nicotine or tobacco, such as cigarettes and e-cigarettes. If you need help quitting, ask your health care provider. Summary  Having a healthy lifestyle and getting preventive care can help to protect your health and wellness after age 77.  Screening and testing are the best way to find a health problem early and help you avoid having a fall. Early diagnosis and treatment give you the best chance for managing medical conditions that are more common for people who are older than age 67.  Falls are a major cause of broken bones and head injuries in people who are older than age 77. Take precautions to prevent a fall at home.  Work with your health care provider to learn what changes you can make to improve your health and wellness and to prevent falls. This information is not intended to replace advice given to you by your health care provider. Make sure you discuss any questions you have with your health care provider. Document Revised: 03/05/2019 Document Reviewed: 09/25/2017 Elsevier Patient Education  2020 [employer].

## 2020-01-11 NOTE — Assessment & Plan Note (Signed)
She  has no prior history of hypertension. Her home readings of  blood pressure have been normal.  Urine microalbumin to creatinine ratio done today is normal.  Lab Results  Component Value Date   MICROALBUR <0.7 07/09/2019   MICROALBUR 0.8 07/02/2018
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
## 2020-01-12 ENCOUNTER — Ambulatory Visit (INDEPENDENT_AMBULATORY_CARE_PROVIDER_SITE_OTHER): Payer: Medicare Other

## 2020-01-12 VITALS — Ht 62.5 in | Wt 125.0 lb

## 2020-01-12 DIAGNOSIS — Z0000 Encounter for general adult medical examination without abnormal findings: Secondary | ICD-10-CM | POA: Diagnosis not present
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
## 2020-01-12 NOTE — Patient Instructions (Addendum)
  Ms. Kauble , Thank you for taking time to come for your Medicare Wellness Visit. I appreciate your ongoing commitment to your health goals. Please review the following plan we discussed and let me know if I can assist you in the future.   These are the goals we discussed: Goals      Patient Stated   . Maintain Healthy Lifestyle (pt-stated)       This is a list of the screening recommended for you and due dates:  Health Maintenance  Topic Date Due  . Mammogram  06/10/2020  . Tetanus Vaccine  06/01/2024  . Flu Shot  Completed  . DEXA scan (bone density measurement)  Completed  . Pneumonia vaccines  Completed

## 2020-01-27 DIAGNOSIS — Z1283 Encounter for screening for malignant neoplasm of skin: Secondary | ICD-10-CM | POA: Diagnosis not present

## 2020-01-27 DIAGNOSIS — L821 Other seborrheic keratosis: Secondary | ICD-10-CM | POA: Diagnosis not present

## 2020-01-27 DIAGNOSIS — L814 Other melanin hyperpigmentation: Secondary | ICD-10-CM | POA: Diagnosis not present

## 2020-01-27 DIAGNOSIS — L578 Other skin changes due to chronic exposure to nonionizing radiation: Secondary | ICD-10-CM | POA: Diagnosis not present

## 2020-01-27 DIAGNOSIS — D1801 Hemangioma of skin and subcutaneous tissue: Secondary | ICD-10-CM | POA: Diagnosis not present

## 2020-01-27 DIAGNOSIS — L820 Inflamed seborrheic keratosis: Secondary | ICD-10-CM | POA: Diagnosis not present

## 2020-01-27 DIAGNOSIS — L570 Actinic keratosis: Secondary | ICD-10-CM | POA: Diagnosis not present

## 2020-01-27 DIAGNOSIS — D225 Melanocytic nevi of trunk: Secondary | ICD-10-CM | POA: Diagnosis not present

## 2020-04-05 DIAGNOSIS — M25511 Pain in right shoulder: Secondary | ICD-10-CM | POA: Diagnosis not present

## 2020-04-14 ENCOUNTER — Ambulatory Visit
Admission: RE | Admit: 2020-04-14 | Discharge: 2020-04-14 | Disposition: A | Discharge status: Home / Self Care | Payer: Medicare Other | Source: Ambulatory Visit | Attending: Internal Medicine | Admitting: Internal Medicine

## 2020-04-14 DIAGNOSIS — Z780 Asymptomatic menopausal state: Secondary | ICD-10-CM | POA: Insufficient documentation

## 2020-04-14 DIAGNOSIS — M810 Age-related osteoporosis without current pathological fracture: Secondary | ICD-10-CM | POA: Diagnosis not present

## 2020-05-06 ENCOUNTER — Other Ambulatory Visit: Payer: Self-pay | Admitting: Internal Medicine

## 2020-05-11 DIAGNOSIS — M25511 Pain in right shoulder: Secondary | ICD-10-CM | POA: Diagnosis not present

## 2020-06-16 ENCOUNTER — Ambulatory Visit
Admission: RE | Admit: 2020-06-16 | Discharge: 2020-06-16 | Disposition: A | Discharge status: Home / Self Care | Payer: Medicare Other | Source: Ambulatory Visit | Attending: Internal Medicine | Admitting: Internal Medicine

## 2020-06-16 DIAGNOSIS — Z1231 Encounter for screening mammogram for malignant neoplasm of breast: Secondary | ICD-10-CM

## 2020-07-04 DIAGNOSIS — M25511 Pain in right shoulder: Secondary | ICD-10-CM | POA: Diagnosis not present

## 2020-07-11 DIAGNOSIS — M25511 Pain in right shoulder: Secondary | ICD-10-CM | POA: Diagnosis not present

## 2020-08-12 ENCOUNTER — Other Ambulatory Visit: Payer: Self-pay | Admitting: Internal Medicine

## 2020-09-13 DIAGNOSIS — Z23 Encounter for immunization: Secondary | ICD-10-CM | POA: Diagnosis not present

## 2020-09-19 DIAGNOSIS — H2513 Age-related nuclear cataract, bilateral: Secondary | ICD-10-CM | POA: Diagnosis not present

## 2020-09-29 DIAGNOSIS — M75111 Incomplete rotator cuff tear or rupture of right shoulder, not specified as traumatic: Secondary | ICD-10-CM | POA: Diagnosis not present

## 2020-09-29 DIAGNOSIS — R293 Abnormal posture: Secondary | ICD-10-CM | POA: Diagnosis not present

## 2020-09-29 DIAGNOSIS — M25511 Pain in right shoulder: Secondary | ICD-10-CM | POA: Diagnosis not present

## 2020-09-29 DIAGNOSIS — M6281 Muscle weakness (generalized): Secondary | ICD-10-CM | POA: Diagnosis not present

## 2020-10-03 DIAGNOSIS — M6281 Muscle weakness (generalized): Secondary | ICD-10-CM | POA: Diagnosis not present

## 2020-10-03 DIAGNOSIS — R293 Abnormal posture: Secondary | ICD-10-CM | POA: Diagnosis not present

## 2020-10-03 DIAGNOSIS — M75111 Incomplete rotator cuff tear or rupture of right shoulder, not specified as traumatic: Secondary | ICD-10-CM | POA: Diagnosis not present

## 2020-10-03 DIAGNOSIS — M25511 Pain in right shoulder: Secondary | ICD-10-CM | POA: Diagnosis not present

## 2020-10-06 DIAGNOSIS — R293 Abnormal posture: Secondary | ICD-10-CM | POA: Diagnosis not present

## 2020-10-06 DIAGNOSIS — M6281 Muscle weakness (generalized): Secondary | ICD-10-CM | POA: Diagnosis not present

## 2020-10-06 DIAGNOSIS — M25511 Pain in right shoulder: Secondary | ICD-10-CM | POA: Diagnosis not present

## 2020-10-06 DIAGNOSIS — M75111 Incomplete rotator cuff tear or rupture of right shoulder, not specified as traumatic: Secondary | ICD-10-CM | POA: Diagnosis not present

## 2020-10-11 DIAGNOSIS — Z23 Encounter for immunization: Secondary | ICD-10-CM | POA: Diagnosis not present

## 2020-10-12 DIAGNOSIS — M25511 Pain in right shoulder: Secondary | ICD-10-CM | POA: Diagnosis not present

## 2020-10-12 DIAGNOSIS — M6281 Muscle weakness (generalized): Secondary | ICD-10-CM | POA: Diagnosis not present

## 2020-10-12 DIAGNOSIS — M75111 Incomplete rotator cuff tear or rupture of right shoulder, not specified as traumatic: Secondary | ICD-10-CM | POA: Diagnosis not present

## 2020-10-12 DIAGNOSIS — R293 Abnormal posture: Secondary | ICD-10-CM | POA: Diagnosis not present

## 2020-10-24 DIAGNOSIS — M75111 Incomplete rotator cuff tear or rupture of right shoulder, not specified as traumatic: Secondary | ICD-10-CM | POA: Diagnosis not present

## 2020-10-24 DIAGNOSIS — M6281 Muscle weakness (generalized): Secondary | ICD-10-CM | POA: Diagnosis not present

## 2020-10-24 DIAGNOSIS — R293 Abnormal posture: Secondary | ICD-10-CM | POA: Diagnosis not present

## 2020-10-24 DIAGNOSIS — M25511 Pain in right shoulder: Secondary | ICD-10-CM | POA: Diagnosis not present

## 2020-10-26 DIAGNOSIS — R419 Unspecified symptoms and signs involving cognitive functions and awareness: Secondary | ICD-10-CM

## 2020-10-31 ENCOUNTER — Encounter: Payer: Self-pay | Admitting: Counselor

## 2020-11-15 DIAGNOSIS — M6281 Muscle weakness (generalized): Secondary | ICD-10-CM | POA: Diagnosis not present

## 2020-11-15 DIAGNOSIS — M75111 Incomplete rotator cuff tear or rupture of right shoulder, not specified as traumatic: Secondary | ICD-10-CM | POA: Diagnosis not present

## 2020-11-15 DIAGNOSIS — R293 Abnormal posture: Secondary | ICD-10-CM | POA: Diagnosis not present

## 2020-11-15 DIAGNOSIS — M25511 Pain in right shoulder: Secondary | ICD-10-CM | POA: Diagnosis not present

## 2020-11-24 DIAGNOSIS — E039 Hypothyroidism, unspecified: Secondary | ICD-10-CM | POA: Diagnosis not present

## 2020-11-28 ENCOUNTER — Other Ambulatory Visit: Payer: Self-pay

## 2020-11-28 ENCOUNTER — Encounter: Payer: Self-pay | Admitting: Ophthalmology

## 2020-12-02 ENCOUNTER — Other Ambulatory Visit
Admission: RE | Admit: 2020-12-02 | Discharge: 2020-12-02 | Disposition: A | Discharge status: Home / Self Care | Payer: Medicare Other | Source: Ambulatory Visit | Attending: Ophthalmology | Admitting: Ophthalmology

## 2020-12-02 DIAGNOSIS — Z01812 Encounter for preprocedural laboratory examination: Secondary | ICD-10-CM | POA: Diagnosis not present

## 2020-12-02 DIAGNOSIS — Z20822 Contact with and (suspected) exposure to covid-19: Secondary | ICD-10-CM | POA: Diagnosis not present

## 2020-12-02 NOTE — Discharge Instructions (Signed)

## 2020-12-03 LAB — SARS CORONAVIRUS 2 (TAT 6-24 HRS): SARS Coronavirus 2: NEGATIVE

## 2020-12-06 ENCOUNTER — Other Ambulatory Visit: Payer: Self-pay

## 2020-12-06 ENCOUNTER — Ambulatory Visit: Payer: Medicare Other | Admitting: Anesthesiology

## 2020-12-06 ENCOUNTER — Encounter
Admission: RE | Disposition: A | Discharge status: Home / Self Care | Payer: Self-pay | Source: Home / Self Care | Attending: Ophthalmology

## 2020-12-06 ENCOUNTER — Ambulatory Visit
Admission: RE | Admit: 2020-12-06 | Discharge: 2020-12-06 | Disposition: A | Discharge status: Home / Self Care | Payer: Medicare Other | Attending: Ophthalmology | Admitting: Ophthalmology

## 2020-12-06 DIAGNOSIS — H25811 Combined forms of age-related cataract, right eye: Secondary | ICD-10-CM | POA: Diagnosis not present

## 2020-12-06 DIAGNOSIS — H2511 Age-related nuclear cataract, right eye: Secondary | ICD-10-CM | POA: Insufficient documentation

## 2020-12-06 DIAGNOSIS — Z7982 Long term (current) use of aspirin: Secondary | ICD-10-CM | POA: Insufficient documentation

## 2020-12-06 DIAGNOSIS — Z79890 Hormone replacement therapy (postmenopausal): Secondary | ICD-10-CM | POA: Insufficient documentation

## 2020-12-06 DIAGNOSIS — Z79899 Other long term (current) drug therapy: Secondary | ICD-10-CM | POA: Insufficient documentation

## 2020-12-06 DIAGNOSIS — Z888 Allergy status to other drugs, medicaments and biological substances status: Secondary | ICD-10-CM | POA: Diagnosis not present

## 2020-12-06 HISTORY — PX: CATARACT EXTRACTION W/PHACO: SHX586

## 2020-12-06 SURGERY — PHACOEMULSIFICATION, CATARACT, WITH IOL INSERTION
Anesthesia: Monitor Anesthesia Care | Site: Eye | Laterality: Right

## 2020-12-06 MED ORDER — BRIMONIDINE TARTRATE-TIMOLOL 0.2-0.5 % OP SOLN
OPHTHALMIC | Status: DC | PRN
  Administered 2020-12-06: 1 [drp] via OPHTHALMIC

## 2020-12-06 MED ORDER — LIDOCAINE HCL (PF) 2 % IJ SOLN
INTRAOCULAR | Status: DC | PRN
  Administered 2020-12-06: 1 mL

## 2020-12-06 MED ORDER — TETRACAINE HCL 0.5 % OP SOLN
1.0000 [drp] | OPHTHALMIC | Status: DC | PRN
  Administered 2020-12-06 (×3): 1 [drp] via OPHTHALMIC

## 2020-12-06 MED ORDER — EPINEPHRINE PF 1 MG/ML IJ SOLN
INTRAOCULAR | Status: DC | PRN
  Administered 2020-12-06: 60 mL via OPHTHALMIC

## 2020-12-06 MED ORDER — MOXIFLOXACIN HCL 0.5 % OP SOLN
OPHTHALMIC | Status: DC | PRN
  Administered 2020-12-06: 0.2 mL via OPHTHALMIC

## 2020-12-06 MED ORDER — ARMC OPHTHALMIC DILATING DROPS
1.0000 "application " | OPHTHALMIC | Status: DC | PRN
  Administered 2020-12-06 (×3): 1 via OPHTHALMIC

## 2020-12-06 MED ORDER — FENTANYL CITRATE (PF) 100 MCG/2ML IJ SOLN
INTRAMUSCULAR | Status: DC | PRN
  Administered 2020-12-06: 50 ug via INTRAVENOUS

## 2020-12-06 MED ORDER — LACTATED RINGERS IV SOLN: INTRAVENOUS | Status: DC

## 2020-12-06 MED ORDER — NA CHONDROIT SULF-NA HYALURON 40-17 MG/ML IO SOLN
INTRAOCULAR | Status: DC | PRN
  Administered 2020-12-06: 1 mL via INTRAOCULAR

## 2020-12-06 MED ORDER — MIDAZOLAM HCL 2 MG/2ML IJ SOLN
INTRAMUSCULAR | Status: DC | PRN
  Administered 2020-12-06: 2 mg via INTRAVENOUS

## 2020-12-06 SURGICAL SUPPLY — 23 items
CANNULA ANT/CHMB 27G (MISCELLANEOUS) ×2 IMPLANT
CANNULA ANT/CHMB 27GA (MISCELLANEOUS) ×4 IMPLANT
GLOVE SURG LX 8.0 MICRO (GLOVE) ×2
GLOVE SURG LX STRL 8.0 MICRO (GLOVE) ×1 IMPLANT
GLOVE SURG TRIUMPH 8.0 PF LTX (GLOVE) ×2 IMPLANT
GOWN STRL REUS W/ TWL LRG LVL3 (GOWN DISPOSABLE) ×2 IMPLANT
GOWN STRL REUS W/TWL LRG LVL3 (GOWN DISPOSABLE) ×4
LENS IOL ACRSF IQ VT 15 21.5 IMPLANT
LENS IOL ACRYSOF VIVITY 21.5 ×2 IMPLANT
LENS IOL VIVITY 015 21.5 ×1 IMPLANT
MARKER SKIN DUAL TIP RULER LAB (MISCELLANEOUS) ×2 IMPLANT
NDL FILTER BLUNT 18X1 1/2 (NEEDLE) ×1 IMPLANT
NEEDLE FILTER BLUNT 18X 1/2SAF (NEEDLE) ×1
NEEDLE FILTER BLUNT 18X1 1/2 (NEEDLE) ×1 IMPLANT
PACK EYE AFTER SURG (MISCELLANEOUS) ×2 IMPLANT
PACK OPTHALMIC (MISCELLANEOUS) ×2 IMPLANT
PACK PORFILIO (MISCELLANEOUS) ×2 IMPLANT
SUT ETHILON 10-0 CS-B-6CS-B-6 (SUTURE)
SUTURE EHLN 10-0 CS-B-6CS-B-6 (SUTURE) IMPLANT
SYR 3ML LL SCALE MARK (SYRINGE) ×2 IMPLANT
SYR TB 1ML LUER SLIP (SYRINGE) ×2 IMPLANT
WATER STERILE IRR 250ML POUR (IV SOLUTION) ×2 IMPLANT
WIPE NON LINTING 3.25X3.25 (MISCELLANEOUS) ×2 IMPLANT

## 2020-12-06 NOTE — Anesthesia Procedure Notes (Signed)
Procedure Name: MAC Date/Time: 12/06/2020 10:18 AM Performed by: Jeannene Patella, CRNA Pre-anesthesia Checklist: Patient identified, Emergency Drugs available, Suction available, Timeout performed and Patient being monitored Patient Re-evaluated:Patient Re-evaluated prior to induction Oxygen Delivery Method: Nasal cannula Placement Confirmation: positive ETCO2

## 2020-12-06 NOTE — Anesthesia Preprocedure Evaluation (Signed)
Anesthesia Evaluation  Patient identified by MRN, date of birth, ID band Patient awake    Reviewed: Allergy & Precautions, NPO status , Patient's Chart, lab work & pertinent test results  Airway Mallampati: II  TM Distance: >3 FB Neck ROM: Full    Dental no notable dental hx.    Pulmonary neg pulmonary ROS,    Pulmonary exam normal        Cardiovascular negative cardio ROS Normal cardiovascular exam     Neuro/Psych  Headaches, negative psych ROS   GI/Hepatic negative GI ROS, Neg liver ROS,   Endo/Other  Hypothyroidism   Renal/GU negative Renal ROS  negative genitourinary   Musculoskeletal  (+) Arthritis ,   Abdominal Normal abdominal exam  (+)   Peds  Hematology negative hematology ROS (+)   Anesthesia Other Findings   Reproductive/Obstetrics negative OB ROS                             Anesthesia Physical Anesthesia Plan  ASA: II  Anesthesia Plan: MAC   Post-op Pain Management:    Induction: Intravenous  PONV Risk Score and Plan: 2 and TIVA, Midazolam and Treatment may vary due to age or medical condition  Airway Management Planned: Natural Airway and Nasal Cannula  Additional Equipment: None  Intra-op Plan:   Post-operative Plan:   Informed Consent: I have reviewed the patients History and Physical, chart, labs and discussed the procedure including the risks, benefits and alternatives for the proposed anesthesia with the patient or authorized representative who has indicated his/her understanding and acceptance.     Dental advisory given  Plan Discussed with: CRNA  Anesthesia Plan Comments:         Anesthesia Quick Evaluation

## 2020-12-06 NOTE — Transfer of Care (Signed)
Immediate Anesthesia Transfer of Care Note  Patient: Raven [name]  Procedure(s) Performed: CATARACT EXTRACTION PHACO AND INTRAOCULAR LENS PLACEMENT (IOC) RIGHT VIVITY LENS (Right Eye)  Patient Location: PACU  Anesthesia Type: MAC  Level of Consciousness: awake, alert  and patient cooperative  Airway and Oxygen Therapy: Patient Spontanous Breathing and Patient connected to supplemental oxygen  Post-op Assessment: Post-op Vital signs reviewed, Patient's Cardiovascular Status Stable, Respiratory Function Stable, Patent Airway and No signs of Nausea or vomiting  Post-op Vital Signs: Reviewed and stable  Complications: No complications documented.
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
## 2020-12-06 NOTE — Anesthesia Postprocedure Evaluation (Signed)
Anesthesia Post Note  Patient: Raven [name]  Procedure(s) Performed: CATARACT EXTRACTION PHACO AND INTRAOCULAR LENS PLACEMENT (IOC) RIGHT VIVITY LENS (Right Eye)     Patient location during evaluation: PACU Anesthesia Type: MAC Level of consciousness: awake and alert Pain management: pain level controlled Vital Signs Assessment: post-procedure vital signs reviewed and stable Respiratory status: spontaneous breathing and nonlabored ventilation Cardiovascular status: blood pressure returned to baseline Postop Assessment: no apparent nausea or vomiting Anesthetic complications: no   No complications documented.  Laylani Pudwill [employer]

## 2020-12-06 NOTE — Op Note (Signed)
PREOPERATIVE DIAGNOSIS:  Nuclear sclerotic cataract of the right eye.   POSTOPERATIVE DIAGNOSIS:  Cataract   OPERATIVE PROCEDURE:@   SURGEON:  Birder Robson, MD.   ANESTHESIA:  Anesthesiologist: Ardeth Sportsman, MD CRNA: Jeannene Patella, CRNA  1.      Managed anesthesia care. 2.      0.67ml of Shugarcaine was instilled in the eye following the paracentesis.   COMPLICATIONS:  None.   TECHNIQUE:   Stop and chop   DESCRIPTION OF PROCEDURE:  The patient was examined and consented in the preoperative holding area where the aforementioned topical anesthesia was applied to the right eye and then brought back to the Operating Room where the right eye was prepped and draped in the usual sterile ophthalmic fashion and a lid speculum was placed. A paracentesis was created with the side port blade and the anterior chamber was filled with viscoelastic. A near clear corneal incision was performed with the steel keratome. A continuous curvilinear capsulorrhexis was performed with a cystotome followed by the capsulorrhexis forceps. Hydrodissection and hydrodelineation were carried out with BSS on a blunt cannula. The lens was removed in a stop and chop  technique and the remaining cortical material was removed with the irrigation-aspiration handpiece. The capsular bag was inflated with viscoelastic and the Technis DFT  lens was placed in the capsular bag without complication. The remaining viscoelastic was removed from the eye with the irrigation-aspiration handpiece. The wounds were hydrated. The anterior chamber was flushed with BSS and the eye was inflated to physiologic pressure. 0.24ml of Vigamox was placed in the anterior chamber. The wounds were found to be water tight. The eye was dressed with Combigan. The patient was given protective glasses to wear throughout the day and a shield with which to sleep tonight. The patient was also given drops with which to begin a drop regimen today and will follow-up  with me in one day. Implant Name Type Inv. Item Serial No. Manufacturer Lot No. LRB No. Used Action  LENS IOL ACRYSOF VIVITY 21.5 - [id number]  LENS IOL ACRYSOF VIVITY 21.5 [id number] ALCON  Right 1 Implanted   Procedure(s) with comments: CATARACT EXTRACTION PHACO AND INTRAOCULAR LENS PLACEMENT (IOC) RIGHT VIVITY LENS (Right) - 7.75 0:49.0  Electronically signed: Birder Robson 12/06/2020 10:33 AM

## 2020-12-07 ENCOUNTER — Encounter: Payer: Self-pay | Admitting: Ophthalmology

## 2020-12-15 ENCOUNTER — Encounter: Payer: Medicare Other | Admitting: Counselor

## 2020-12-15 DIAGNOSIS — H2512 Age-related nuclear cataract, left eye: Secondary | ICD-10-CM | POA: Diagnosis not present

## 2020-12-15 DIAGNOSIS — E039 Hypothyroidism, unspecified: Secondary | ICD-10-CM | POA: Diagnosis not present

## 2020-12-16 ENCOUNTER — Encounter: Payer: Medicare Other | Admitting: Counselor

## 2020-12-20 ENCOUNTER — Encounter: Payer: Self-pay | Admitting: Ophthalmology

## 2020-12-23 ENCOUNTER — Encounter: Payer: Medicare Other | Admitting: Counselor

## 2020-12-27 DIAGNOSIS — M75111 Incomplete rotator cuff tear or rupture of right shoulder, not specified as traumatic: Secondary | ICD-10-CM | POA: Diagnosis not present

## 2020-12-27 DIAGNOSIS — R293 Abnormal posture: Secondary | ICD-10-CM | POA: Diagnosis not present

## 2020-12-27 DIAGNOSIS — M6281 Muscle weakness (generalized): Secondary | ICD-10-CM | POA: Diagnosis not present

## 2020-12-27 DIAGNOSIS — M25511 Pain in right shoulder: Secondary | ICD-10-CM | POA: Diagnosis not present

## 2020-12-30 ENCOUNTER — Other Ambulatory Visit: Payer: Self-pay

## 2020-12-30 ENCOUNTER — Other Ambulatory Visit
Admission: RE | Admit: 2020-12-30 | Discharge: 2020-12-30 | Disposition: A | Discharge status: Home / Self Care | Payer: Medicare Other | Source: Ambulatory Visit | Attending: Ophthalmology | Admitting: Ophthalmology

## 2020-12-30 DIAGNOSIS — Z01812 Encounter for preprocedural laboratory examination: Secondary | ICD-10-CM | POA: Insufficient documentation

## 2020-12-30 DIAGNOSIS — M75111 Incomplete rotator cuff tear or rupture of right shoulder, not specified as traumatic: Secondary | ICD-10-CM | POA: Diagnosis not present

## 2020-12-30 DIAGNOSIS — Z20822 Contact with and (suspected) exposure to covid-19: Secondary | ICD-10-CM | POA: Insufficient documentation

## 2020-12-30 DIAGNOSIS — R293 Abnormal posture: Secondary | ICD-10-CM | POA: Diagnosis not present

## 2020-12-30 DIAGNOSIS — M6281 Muscle weakness (generalized): Secondary | ICD-10-CM | POA: Diagnosis not present

## 2020-12-30 DIAGNOSIS — M25511 Pain in right shoulder: Secondary | ICD-10-CM | POA: Diagnosis not present

## 2020-12-30 NOTE — Discharge Instructions (Signed)

## 2020-12-31 LAB — SARS CORONAVIRUS 2 (TAT 6-24 HRS): SARS Coronavirus 2: NEGATIVE

## 2021-01-03 ENCOUNTER — Encounter
Admission: RE | Disposition: A | Discharge status: Home / Self Care | Payer: Self-pay | Source: Home / Self Care | Attending: Ophthalmology

## 2021-01-03 ENCOUNTER — Ambulatory Visit
Admission: RE | Admit: 2021-01-03 | Discharge: 2021-01-03 | Disposition: A | Discharge status: Home / Self Care | Payer: Medicare Other | Attending: Ophthalmology | Admitting: Ophthalmology

## 2021-01-03 ENCOUNTER — Ambulatory Visit: Payer: Medicare Other | Admitting: Anesthesiology

## 2021-01-03 ENCOUNTER — Other Ambulatory Visit: Payer: Self-pay

## 2021-01-03 ENCOUNTER — Encounter: Payer: Self-pay | Admitting: Ophthalmology

## 2021-01-03 DIAGNOSIS — H2512 Age-related nuclear cataract, left eye: Secondary | ICD-10-CM | POA: Insufficient documentation

## 2021-01-03 DIAGNOSIS — Z79899 Other long term (current) drug therapy: Secondary | ICD-10-CM | POA: Insufficient documentation

## 2021-01-03 DIAGNOSIS — H25812 Combined forms of age-related cataract, left eye: Secondary | ICD-10-CM | POA: Diagnosis not present

## 2021-01-03 DIAGNOSIS — Z888 Allergy status to other drugs, medicaments and biological substances status: Secondary | ICD-10-CM | POA: Insufficient documentation

## 2021-01-03 DIAGNOSIS — Z79890 Hormone replacement therapy (postmenopausal): Secondary | ICD-10-CM | POA: Diagnosis not present

## 2021-01-03 HISTORY — PX: CATARACT EXTRACTION W/PHACO: SHX586

## 2021-01-03 SURGERY — PHACOEMULSIFICATION, CATARACT, WITH IOL INSERTION
Anesthesia: Monitor Anesthesia Care | Site: Eye | Laterality: Left

## 2021-01-03 MED ORDER — ARMC OPHTHALMIC DILATING DROPS
1.0000 "application " | OPHTHALMIC | Status: DC | PRN
  Administered 2021-01-03 (×3): 1 via OPHTHALMIC

## 2021-01-03 MED ORDER — MIDAZOLAM HCL 2 MG/2ML IJ SOLN
INTRAMUSCULAR | Status: DC | PRN
  Administered 2021-01-03: 2 mg via INTRAVENOUS

## 2021-01-03 MED ORDER — TETRACAINE HCL 0.5 % OP SOLN
1.0000 [drp] | OPHTHALMIC | Status: DC | PRN
  Administered 2021-01-03 (×3): 1 [drp] via OPHTHALMIC

## 2021-01-03 MED ORDER — BRIMONIDINE TARTRATE-TIMOLOL 0.2-0.5 % OP SOLN
OPHTHALMIC | Status: DC | PRN
  Administered 2021-01-03: 1 [drp] via OPHTHALMIC

## 2021-01-03 MED ORDER — MOXIFLOXACIN HCL 0.5 % OP SOLN
OPHTHALMIC | Status: DC | PRN
  Administered 2021-01-03: 0.2 mL via OPHTHALMIC

## 2021-01-03 MED ORDER — LIDOCAINE HCL (PF) 2 % IJ SOLN
INTRAOCULAR | Status: DC | PRN
  Administered 2021-01-03: 1 mL

## 2021-01-03 MED ORDER — NA CHONDROIT SULF-NA HYALURON 40-17 MG/ML IO SOLN
INTRAOCULAR | Status: DC | PRN
  Administered 2021-01-03: 1 mL via INTRAOCULAR

## 2021-01-03 MED ORDER — FENTANYL CITRATE (PF) 100 MCG/2ML IJ SOLN
INTRAMUSCULAR | Status: DC | PRN
  Administered 2021-01-03: 50 ug via INTRAVENOUS

## 2021-01-03 SURGICAL SUPPLY — 21 items
CANNULA ANT/CHMB 27G (MISCELLANEOUS) ×2 IMPLANT
CANNULA ANT/CHMB 27GA (MISCELLANEOUS) ×4 IMPLANT
GLOVE SURG LX 8.0 MICRO (GLOVE) ×1
GLOVE SURG LX STRL 8.0 MICRO (GLOVE) ×1 IMPLANT
GLOVE SURG TRIUMPH 8.0 PF LTX (GLOVE) ×2 IMPLANT
GOWN STRL REUS W/ TWL LRG LVL3 (GOWN DISPOSABLE) ×2 IMPLANT
GOWN STRL REUS W/TWL LRG LVL3 (GOWN DISPOSABLE) ×4
LENS IOL ACRSF IQ VT 15 22.0 IMPLANT
LENS IOL ACRYSOF VIVITY 22.0 ×2 IMPLANT
LENS IOL VIVITY 015 22.0 ×1 IMPLANT
MARKER SKIN DUAL TIP RULER LAB (MISCELLANEOUS) ×2 IMPLANT
NDL FILTER BLUNT 18X1 1/2 (NEEDLE) ×1 IMPLANT
NEEDLE FILTER BLUNT 18X 1/2SAF (NEEDLE) ×1
NEEDLE FILTER BLUNT 18X1 1/2 (NEEDLE) ×1 IMPLANT
PACK EYE AFTER SURG (MISCELLANEOUS) ×2 IMPLANT
PACK OPTHALMIC (MISCELLANEOUS) ×2 IMPLANT
PACK PORFILIO (MISCELLANEOUS) ×2 IMPLANT
SYR 3ML LL SCALE MARK (SYRINGE) ×2 IMPLANT
SYR TB 1ML LUER SLIP (SYRINGE) ×2 IMPLANT
WATER STERILE IRR 250ML POUR (IV SOLUTION) ×2 IMPLANT
WIPE NON LINTING 3.25X3.25 (MISCELLANEOUS) ×2 IMPLANT

## 2021-01-03 NOTE — Anesthesia Preprocedure Evaluation (Signed)
Anesthesia Evaluation  Patient identified by MRN, date of birth, ID band Patient awake    Reviewed: Allergy & Precautions, H&P , NPO status , Patient's Chart, lab work & pertinent test results  Airway Mallampati: II  TM Distance: >3 FB Neck ROM: full    Dental no notable dental hx.    Pulmonary neg pulmonary ROS,    Pulmonary exam normal        Cardiovascular negative cardio ROS Normal cardiovascular exam Rhythm:regular Rate:Normal     Neuro/Psych  Headaches, negative psych ROS   GI/Hepatic negative GI ROS, Neg liver ROS,   Endo/Other  Hypothyroidism   Renal/GU negative Renal ROS     Musculoskeletal   Abdominal   Peds  Hematology negative hematology ROS (+)   Anesthesia Other Findings   Reproductive/Obstetrics negative OB ROS                             Anesthesia Physical Anesthesia Plan  ASA: II  Anesthesia Plan: MAC   Post-op Pain Management:    Induction:   PONV Risk Score and Plan: 2 and Treatment may vary due to age or medical condition  Airway Management Planned:   Additional Equipment:   Intra-op Plan:   Post-operative Plan:   Informed Consent: I have reviewed the patients History and Physical, chart, labs and discussed the procedure including the risks, benefits and alternatives for the proposed anesthesia with the patient or authorized representative who has indicated his/her understanding and acceptance.       Plan Discussed with:   Anesthesia Plan Comments:         Anesthesia Quick Evaluation

## 2021-01-03 NOTE — Anesthesia Procedure Notes (Signed)
Procedure Name: MAC Performed by: Artemis Koller, CRNA Pre-anesthesia Checklist: Patient identified, Emergency Drugs available, Suction available, Timeout performed and Patient being monitored Patient Re-evaluated:Patient Re-evaluated prior to induction Oxygen Delivery Method: Nasal cannula Placement Confirmation: positive ETCO2       

## 2021-01-03 NOTE — Op Note (Signed)
PREOPERATIVE DIAGNOSIS:  Nuclear sclerotic cataract of the left eye.   POSTOPERATIVE DIAGNOSIS:  Nuclear sclerotic cataract of the left eye.   OPERATIVE PROCEDURE:@   SURGEON:  Birder Robson, MD.   ANESTHESIA:  Anesthesiologist: Elgie Collard, MD CRNA: Mayme Genta, CRNA  1.      Managed anesthesia care. 2.     0.39ml of Shugarcaine was instilled following the paracentesis   COMPLICATIONS:  None.   TECHNIQUE:   Stop and chop   DESCRIPTION OF PROCEDURE:  The patient was examined and consented in the preoperative holding area where the aforementioned topical anesthesia was applied to the left eye and then brought back to the Operating Room where the left eye was prepped and draped in the usual sterile ophthalmic fashion and a lid speculum was placed. A paracentesis was created with the side port blade and the anterior chamber was filled with viscoelastic. A near clear corneal incision was performed with the steel keratome. A continuous curvilinear capsulorrhexis was performed with a cystotome followed by the capsulorrhexis forceps. Hydrodissection and hydrodelineation were carried out with BSS on a blunt cannula. The lens was removed in a stop and chop  technique and the remaining cortical material was removed with the irrigation-aspiration handpiece. The capsular bag was inflated with viscoelastic and the Technis ZCB00 lens was placed in the capsular bag without complication. The remaining viscoelastic was removed from the eye with the irrigation-aspiration handpiece. The wounds were hydrated. The anterior chamber was flushed with BSS and the eye was inflated to physiologic pressure. 0.56ml Vigamox was placed in the anterior chamber. The wounds were found to be water tight. The eye was dressed with Combigan. The patient was given protective glasses to wear throughout the day and a shield with which to sleep tonight. The patient was also given drops with which to begin a drop regimen today and  will follow-up with me in one day. Implant Name Type Inv. Item Serial No. Manufacturer Lot No. LRB No. Used Action  LENS IOL ACRYSOF VIVITY 22.0 - [id number]  LENS IOL ACRYSOF VIVITY 22.0 [id number] ALCON  Left 1 Implanted    Procedure(s): CATARACT EXTRACTION PHACO AND INTRAOCULAR LENS PLACEMENT (IOC) VIVITY LENS LEFT 5.49 00:31.9 (Left)  Electronically signed: Birder Robson 01/03/2021 12:20 PM

## 2021-01-03 NOTE — Transfer of Care (Signed)
Immediate Anesthesia Transfer of Care Note  Patient: Raven [name]  Procedure(s) Performed: CATARACT EXTRACTION PHACO AND INTRAOCULAR LENS PLACEMENT ([facility]) VIVITY LENS LEFT 5.49 00:31.9 (Left Eye)  Patient Location: PACU  Anesthesia Type: MAC  Level of Consciousness: awake, alert  and patient cooperative  Airway and Oxygen Therapy: Patient Spontanous Breathing and Patient connected to supplemental oxygen  Post-op Assessment: Post-op Vital signs reviewed, Patient's Cardiovascular Status Stable, Respiratory Function Stable, Patent Airway and No signs of Nausea or vomiting  Post-op Vital Signs: Reviewed and stable  Complications: No complications documented.
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
## 2021-01-03 NOTE — Anesthesia Postprocedure Evaluation (Signed)
Anesthesia Post Note  Patient: Raven [name]  Procedure(s) Performed: CATARACT EXTRACTION PHACO AND INTRAOCULAR LENS PLACEMENT ([facility]) VIVITY LENS LEFT 5.49 00:31.9 (Left Eye)     Patient location during evaluation: PACU Anesthesia Type: MAC Level of consciousness: awake Pain management: pain level controlled Vital Signs Assessment: post-procedure vital signs reviewed and stable Respiratory status: spontaneous breathing Cardiovascular status: stable Anesthetic complications: no   No complications documented.  Gillian Scarce

## 2021-01-04 ENCOUNTER — Encounter: Payer: Self-pay | Admitting: Ophthalmology

## 2021-01-10 DIAGNOSIS — R293 Abnormal posture: Secondary | ICD-10-CM | POA: Diagnosis not present

## 2021-01-10 DIAGNOSIS — M75111 Incomplete rotator cuff tear or rupture of right shoulder, not specified as traumatic: Secondary | ICD-10-CM | POA: Diagnosis not present

## 2021-01-10 DIAGNOSIS — M25511 Pain in right shoulder: Secondary | ICD-10-CM | POA: Diagnosis not present

## 2021-01-10 DIAGNOSIS — M6281 Muscle weakness (generalized): Secondary | ICD-10-CM | POA: Diagnosis not present

## 2021-01-12 ENCOUNTER — Ambulatory Visit (INDEPENDENT_AMBULATORY_CARE_PROVIDER_SITE_OTHER): Payer: Medicare Other

## 2021-01-12 VITALS — Ht 62.0 in | Wt 125.0 lb

## 2021-01-12 DIAGNOSIS — Z0000 Encounter for general adult medical examination without abnormal findings: Secondary | ICD-10-CM | POA: Diagnosis not present

## 2021-01-12 NOTE — Patient Instructions (Addendum)
Raven [name] , Thank you for taking time to come for your Medicare Wellness Visit. I appreciate your ongoing commitment to your health goals. Please review the following plan we discussed and let me know if I can assist you in the future.   These are the goals we discussed: Goals      Patient Stated   .  Increase physical activity (pt-stated)      Add stationary bike and/or Tai-Chi to exercise routine        This is a list of the screening recommended for you and due dates:  Health Maintenance  Topic Date Due  . Mammogram  06/16/2021  . Tetanus Vaccine  06/01/2024  . Flu Shot  Completed  . DEXA scan (bone density measurement)  Completed  . COVID-19 Vaccine  Completed  .  Hepatitis C: One time screening is recommended by Center for Disease Control  (CDC) for  adults born from [birth year] through 1963.   Completed  . Pneumonia vaccines  Completed    Immunizations Immunization History  Administered Date(s) Administered  . Influenza Split 09/25/2013  . Influenza,inj,Quad PF,6+ Mos 09/13/2020  . Influenza-Unspecified 09/24/2014, 09/25/2015, 09/20/2016, 09/23/2017, 09/25/2018, 09/23/2019  . Moderna Sars-Covid-2 Vaccination 12/08/2019, 01/05/2020  . Pneumococcal Conjugate-13 10/08/2014  . Pneumococcal Polysaccharide-23 10/08/2005, 11/25/2015  . Pneumococcal-Unspecified 09/13/2005  . Tdap 06/01/2014  . Zoster 11/16/2013  . Zoster Recombinat (Shingrix) 01/06/2018, 03/10/2018    Advanced directives: End of life planning; Advance aging; Advanced directives discussed.  Copy of current HCPOA/Living Will requested.    Conditions/risks identified: none new  Follow up in one year for your annual wellness visit    Preventive Care 65 Years and Older, Female Preventive care refers to lifestyle choices and visits with your health care provider that can promote health and wellness. What does preventive care include?  A yearly physical exam. This is also called an annual well check.  Dental  exams once or twice a year.  Routine eye exams. Ask your health care provider how often you should have your eyes checked.  Personal lifestyle choices, including:  Daily care of your teeth and gums.  Regular physical activity.  Eating a healthy diet.  Avoiding tobacco and drug use.  Limiting alcohol use.  Practicing safe sex.  Taking low-dose aspirin every day.  Taking vitamin and mineral supplements as recommended by your health care provider. What happens during an annual well check? The services and screenings done by your health care provider during your annual well check will depend on your age, overall health, lifestyle risk factors, and family history of disease. Counseling  Your health care provider may ask you questions about your:  Alcohol use.  Tobacco use.  Drug use.  Emotional well-being.  Home and relationship well-being.  Sexual activity.  Eating habits.  History of falls.  Memory and ability to understand (cognition).  Work and work Statistician.  Reproductive health. Screening  You may have the following tests or measurements:  Height, weight, and BMI.  Blood pressure.  Lipid and cholesterol levels. These may be checked every 5 years, or more frequently if you are over 70 years old.  Skin check.  Lung cancer screening. You may have this screening every year starting at age 48 if you have a 30-pack-year history of smoking and currently smoke or have quit within the past 15 years.  Fecal occult blood test (FOBT) of the stool. You may have this test every year starting at age 59.  Flexible sigmoidoscopy or colonoscopy.  You may have a sigmoidoscopy every 5 years or a colonoscopy every 10 years starting at age 57.  Hepatitis C blood test.  Hepatitis B blood test.  Sexually transmitted disease (STD) testing.  Diabetes screening. This is done by checking your blood sugar (glucose) after you have not eaten for a while (fasting). You may  have this done every 1-3 years.  Bone density scan. This is done to screen for osteoporosis. You may have this done starting at age 52.  Mammogram. This may be done every 1-2 years. Talk to your health care provider about how often you should have regular mammograms. Talk with your health care provider about your test results, treatment options, and if necessary, the need for more tests. Vaccines  Your health care provider may recommend certain vaccines, such as:  Influenza vaccine. This is recommended every year.  Tetanus, diphtheria, and acellular pertussis (Tdap, Td) vaccine. You may need a Td booster every 10 years.  Zoster vaccine. You may need this after age 52.  Pneumococcal 13-valent conjugate (PCV13) vaccine. One dose is recommended after age 69.  Pneumococcal polysaccharide (PPSV23) vaccine. One dose is recommended after age 70. Talk to your health care provider about which screenings and vaccines you need and how often you need them. This information is not intended to replace advice given to you by your health care provider. Make sure you discuss any questions you have with your health care provider. Document Released: 12/09/2015 Document Revised: 08/01/2016 Document Reviewed: 09/13/2015 Elsevier Interactive Patient Education  2017 [place] Prevention in the Home Falls can cause injuries. They can happen to people of all ages. There are many things you can do to make your home safe and to help prevent falls. What can I do on the outside of my home?  Regularly fix the edges of walkways and driveways and fix any cracks.  Remove anything that might make you trip as you walk through a door, such as a raised step or threshold.  Trim any bushes or trees on the path to your home.  Use bright outdoor lighting.  Clear any walking paths of anything that might make someone trip, such as rocks or tools.  Regularly check to see if handrails are loose or broken. Make  sure that both sides of any steps have handrails.  Any raised decks and porches should have guardrails on the edges.  Have any leaves, snow, or ice cleared regularly.  Use sand or salt on walking paths during winter.  Clean up any spills in your garage right away. This includes oil or grease spills. What can I do in the bathroom?  Use night lights.  Install grab bars by the toilet and in the tub and shower. Do not use towel bars as grab bars.  Use non-skid mats or decals in the tub or shower.  If you need to sit down in the shower, use a plastic, non-slip stool.  Keep the floor dry. Clean up any water that spills on the floor as soon as it happens.  Remove soap buildup in the tub or shower regularly.  Attach bath mats securely with double-sided non-slip rug tape.  Do not have throw rugs and other things on the floor that can make you trip. What can I do in the bedroom?  Use night lights.  Make sure that you have a light by your bed that is easy to reach.  Do not use any sheets or blankets that are too big  for your bed. They should not hang down onto the floor.  Have a firm chair that has side arms. You can use this for support while you get dressed.  Do not have throw rugs and other things on the floor that can make you trip. What can I do in the kitchen?  Clean up any spills right away.  Avoid walking on wet floors.  Keep items that you use a lot in easy-to-reach places.  If you need to reach something above you, use a strong step stool that has a grab bar.  Keep electrical cords out of the way.  Do not use floor polish or wax that makes floors slippery. If you must use wax, use non-skid floor wax.  Do not have throw rugs and other things on the floor that can make you trip. What can I do with my stairs?  Do not leave any items on the stairs.  Make sure that there are handrails on both sides of the stairs and use them. Fix handrails that are broken or loose.  Make sure that handrails are as long as the stairways.  Check any carpeting to make sure that it is firmly attached to the stairs. Fix any carpet that is loose or worn.  Avoid having throw rugs at the top or bottom of the stairs. If you do have throw rugs, attach them to the floor with carpet tape.  Make sure that you have a light switch at the top of the stairs and the bottom of the stairs. If you do not have them, ask someone to add them for you. What else can I do to help prevent falls?  Wear shoes that:  Do not have high heels.  Have rubber bottoms.  Are comfortable and fit you well.  Are closed at the toe. Do not wear sandals.  If you use a stepladder:  Make sure that it is fully opened. Do not climb a closed stepladder.  Make sure that both sides of the stepladder are locked into place.  Ask someone to hold it for you, if possible.  Clearly mark and make sure that you can see:  Any grab bars or handrails.  First and last steps.  Where the edge of each step is.  Use tools that help you move around (mobility aids) if they are needed. These include:  Canes.  Walkers.  Scooters.  Crutches.  Turn on the lights when you go into a dark area. Replace any light bulbs as soon as they burn out.  Set up your furniture so you have a clear path. Avoid moving your furniture around.  If any of your floors are uneven, fix them.  If there are any pets around you, be aware of where they are.  Review your medicines with your doctor. Some medicines can make you feel dizzy. This can increase your chance of falling. Ask your doctor what other things that you can do to help prevent falls. This information is not intended to replace advice given to you by your health care provider. Make sure you discuss any questions you have with your health care provider. Document Released: 09/08/2009 Document Revised: 04/19/2016 Document Reviewed: 12/17/2014 Elsevier Interactive Patient  Education  2017 [employer].
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
## 2021-01-18 ENCOUNTER — Ambulatory Visit (INDEPENDENT_AMBULATORY_CARE_PROVIDER_SITE_OTHER): Payer: Medicare Other | Admitting: Counselor

## 2021-01-18 ENCOUNTER — Ambulatory Visit: Payer: Medicare Other | Admitting: Psychology

## 2021-01-18 ENCOUNTER — Encounter: Payer: Self-pay | Admitting: Counselor

## 2021-01-18 ENCOUNTER — Other Ambulatory Visit: Payer: Self-pay

## 2021-01-18 DIAGNOSIS — F09 Unspecified mental disorder due to known physiological condition: Secondary | ICD-10-CM | POA: Diagnosis not present

## 2021-01-18 DIAGNOSIS — F4521 Hypochondriasis: Secondary | ICD-10-CM

## 2021-01-18 NOTE — Progress Notes (Unsigned)
[facility]  Patient Name: Raven [name] MRN: [id number] Date of Birth: 29-Oct-1941 Age: 80 y.o. Education: 16 years  Referral Circumstances and Background Information  Raven [name] is a 80 y.o., right-hand dominant, widowed woman with a history of concerns about cognition over the past 13 - 18 years. She was seen by Dr. Halina Andreas in 2018, who demonstrated normal cognitive performance on neuropsychological evaluation. She was referred by Deborra Medina, MD with [facility] for re-evaluation.   On interview, the patient reported that she feels like her difficulties have been getting worse since her previous evaluation with Dr. Si Raider. She noticed toward the end of 2021, she was starting to get anxious about things that she wouldn't have been anxious about in the past, which was also something that her mother started doing when she became symptomatic. She does have some extenuating circumstances in terms of COVID and she is going to [place] (she has not traveled alone overseas previously). She had a bad experience traveling in [place] in September, and she is worried about it. With respect to cognitive problems, she feels like she is having a harder time with remembering things than in the past. She takes copious notes and organizes her week but still feels like she has a hard time keeping up with her schedule. She admits that she has "probably too much" going on and "that may be part of it." Her daughter Raven [name] is here with her and presented as though she agrees that the patient is too busy and takes on too many things. She stated that she is more concerned about the patients dementia anxiety than she is about cognitive changes. She denied any repeating, rapid forgetting of information, or forgetting entire events. She has a hard time with her schedule but does not get disoriented to month or to year. She reported problems with word finding, that she noted  at the last evaluation, and she thinks that has gotten worse. There are no delusions or hallucinations.   With respect to mood, the patient and her daughter both think that she is "fairly upbeat" most of the time, although she can be anxious. As above, she is particularly concerned about her trip to [place]. It sounds like part of the reason she wants to go is because she has heard it's good to challenge yourself and this trip is "proving" something to herself with respect to dementia. She has a bit of driven quality to her, as per her daughter, particularly about dealing with Alzheimer's. She admits that there are a number of other things she worries about but they denied that it is excessive or interfering much with her life. In general, other than her worries, they feel as though she is fairly upbeat and they denied any depression or tearfulness. She notices that her cognitive problems are worse when she is anxious. She does not sleep well, she has rotator cuff problems that sometimes wake her up at night. She is still able to get about 7.5 hours of sleep. She is not acting out dreams. Her appetite is normal for her and there are no major weight changes. She has good energy and gets pleasure out of things.   With respect to functioning, the patient clearly tries to stay active. 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 She is on the resient's association as well. She also exercises frequently, lifting and Tai Chi  and Yoga at least 4 times a week. She reported good social support and sees friends frequently. She was dating a man across the street who unfortunately passed last year. She has essentially no impairment in iADLs or ADLs and is still independent with respect to finances, driving, cooking, doing things around the house. She is not particularly good with electronics such as her iPad or iPhone, but that is not new.   Past Medical History and  Review of Relevant Studies   Patient Active Problem List   Diagnosis Date Noted  . Elevated blood pressure reading without diagnosis of hypertension 07/11/2019  . Cataracts, bilateral 01/08/2019  . Chronic right hip pain 01/08/2019  . Hypothyroidism 04/09/2015  . Constipation 04/09/2015  . Counseling about travel 04/09/2015  . 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 Carotid stenosis 01/10/2015  . S/P carpal tunnel release 04/11/2014  . S/P parathyroidectomy ([name]) 03/23/2014  . Hypercalcemia 09/02/2013  . Hyperlipidemia 09/01/2013  . Medicare annual wellness visit, subsequent 12/06/2012  . Screening for breast cancer 12/03/2011  . Osteopenia 12/03/2011  . Screening for cervical cancer 12/03/2011  . Screening for colon cancer 12/03/2011  . Migraine headache 12/03/2011  . Varicose veins of legs 12/03/2011    Review of Neuroimaging and Relevant Medical History: Review of the patient's previous evaluation with Dr. Si Raider shows her to have normal performance in all areas. She appeared to have very good, superior range performance on intellectually relevant measures of verbal and visual skills and test findings in line with that high expectation in all areas. She did report a mild level of anxiety symptoms and was felt to have a mild generalized anxiety disorder.   I did not see any neuroimaging on file.   Last mental status testing was an MMSE on 01/12/2021 on which she scored 30/30.   Current Outpatient Medications  Medication Sig Dispense Refill  . aspirin-acetaminophen-caffeine (EXCEDRIN MIGRAINE) 250-250-65 MG per tablet Take 1 tablet by mouth every 6 (six) hours as needed for headache.    . Biotin 2500 MCG CAPS Take by mouth.    . calcium-vitamin D (OSCAL WITH D) 500-200 MG-UNIT per tablet Take 1 tablet by mouth daily with breakfast.    . folic acid (FOLVITE) 381 MCG tablet Take 400 mcg by mouth daily. (Patient not taking: Reported on 01/03/2021)    . ibuprofen (ADVIL,MOTRIN) 200 MG  tablet Take 200-400 mg by mouth every 6 (six) hours as needed for mild pain or moderate pain.    Marland Kitchen levothyroxine (SYNTHROID) 75 MCG tablet TAKE 1 TAB BY MOUTH ONCE DAILY. TAKE ON AN EMPTY STOMACH WITH A GLASS OF WATER ATLEAST 30-60 MINUTES BEFORE BREAKFAST 90 tablet 1  . Multiple Vitamin (MULTIVITAMIN WITH MINERALS) TABS tablet Take 1 tablet by mouth daily.    . simvastatin (ZOCOR) 40 MG tablet TAKE 1 TABLET BY MOUTH AT BEDTIME 90 tablet 1   No current facility-administered medications for this visit.   Family History  Problem Relation Age of Onset  . Dementia Mother   . Alzheimer's disease Mother   . Heart attack Mother   . Heart disease Father        died at 52  . Heart failure Father   . Heart attack Father   . Dementia Maternal Grandmother   . Arthritis Other   . Breast cancer Neg Hx    There is a family history of dementia. The patient's mother developed the condition and started with changes in her early to mid 46s. Her maternal  grandmother and great grandmother also had dementia (age unknown at which point they developed dementia). There is a family history of psychiatric illness. Her brother has schizophrenia. She denied any other history of schizophrenia.   Psychosocial History  Developmental, Educational and Employment History: The patient denied any history of abuse or neglect as a child. She grew up in [place]. She reported that she did well in school, she finished third in her class although there were only 20 people in her class. She went to [employer] for her undergraduate degree, in consumer services and business. She moved around frequently, they lived in [place] for a time being. She also lived in [place] for a while. 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   Psychiatric History: The patient denied any significant psychiatric history.   Substance Use History: The patient does not consume alcohol regularly, doesn't use illicit drugs, and has never used tobacco products.   Relationship History and Living Cimcumstances: The patient and her husband were married for 69 years. Unfortunately, her husband passed in an automobile accident in 2009. She is currently single. She has a daughter and a son, her son lives in [place].   Mental Status and Behavioral Observations  Sensorium/Arousal: The patient's level of arousal was awake and alert. Hearing and vision were adequate for testing purposes. Orientation: The patient was fully oriented but she was off on the day of the week.  Appearance: Dressed neatly in appropriate, casual clothing Behavior: Pleasant, appropriate, well engaged by the testing.  Speech/language: Speech was normal in rate, rhythm, volume, and prosody. I did not note any word finding pauses or paraphasic errors.  Gait/Posture: Gait was normal on observation of ambulation within the clinic Movement: No tremors observed Social Comportment: The patient was pleasant and appropriate, she had a good sense of humor and interacted well Mood: "I'm pretty upbeat" Affect: Euthymic Thought process/content: Thought process was logical, linear, and goal-directed. Thought content was appropriate Safety: No safety concerns identified in this euthymic patient Insight: Good, in anything patient is overly concerned as she appears to be doing quite well  Montreal Cognitive Assessment  01/18/2021  Visuospatial/ Executive (0/5) 5  Naming (0/3) 3  Attention: Read list of digits (0/2) 2  Attention: Read list of letters (0/1) 1  Attention: Serial 7 subtraction starting at 100 (0/3) 2  Language: Repeat phrase (0/2) 2  Language : Fluency (0/1) 1  Abstraction (0/2) 2  Delayed Recall (0/5) 5  Orientation (0/6) 5  Total 28   Adjusted Score (based on education) 28   Test Procedures  Wide Range Achievement Test - 4             Word Reading Doy Mince' Intellectual Screening Test Neuropsychological Assessment Battery  Memory Module  Naming  Digit Span Repeatable Battery for the Assessment of Neuropsychological Status (Form A)  Figure Copy  Judgment of Line Orientation  Coding  Figure Recall The Dot Counting Test A Random Letter Test Controlled Oral Word Association (F-A-S) Semantic Fluency (Animals) Trail Making Test A & B Complex Ideational Material Modified Wisconsin Card Sorting Test Geriatric Depression Scale - Short Form Quick Dementia Rating System (completed by daughter, Raven [name])  Plan  Raven [name] was seen for a  psychiatric diagnostic evaluation and neuropsychological testing. She is a pleasant, very active, 80 year old, right-hand dominant woman with a history of concerns about thinking and memory problems for nearly two decades, after having some experience with it in her mother. It sounds as though she is excessively fixated on having dementia as per her daughter. She is objectively functioning very well, with an active social life and she is serving on several committees for the community in which she lives. Mental status testing finds her to be within the normal range. She will likely benefit from reassurance, and orientation to dietary and other preventative strategies. Full and complete note with impressions, recommendations, and interpretation of test data to follow.   Raven [name] Raven Kindred, PsyD, [place] Clinical Neuropsychologist  Informed Consent and Coding/Compliance  Risks and benefits of the evaluation were discussed with the patient prior to all testing procedures. I conducted a clinical interview and neuropsychological testing (at least two tests) with Raven [name] and Raven [name], B.S. (Technician) administered additional test procedures. The patient was able to tolerate the testing  procedures and the patient (and/or family if applicable) is likely to benefit from further follow up to receive the diagnosis and treatment recommendations, which will be rendered at the next encounter. Billing below reflects technician time, my direct face-to-face time with the patient, time spent in test administration, and time spent in professional activities including but not limited to: neuropsychological test interpretation, integration of neuropsychological test data with clinical history, report preparation, treatment planning, care coordination, and review of diagnostically pertinent medical history or studies.   Services associated with this encounter: Clinical Interview [phone]) plus 60 minutes (38466; Neuropsychological Evaluation by Professional)  110 minutes (59935; Neuropsychological Evaluation by Professional, Adl.) 26 minutes (70177; Test Administration by Professional) 30 minutes ([id number]; Neuropsychological Testing by Technician) 85 minutes (00923; Neuropsychological Testing by Technician, Adl.)

## 2021-01-18 NOTE — Progress Notes (Signed)
   Psychometrist Note   Cognitive testing was administered to Raven [name] by Raven [name], B.S. (Technician) under the supervision of Alphonzo Severance, Psy.D., ABN. Raven [name] was able to tolerate all test procedures. Dr. Nicole Kindred met with the patient as needed to manage any emotional reactions to the testing procedures. Rest breaks were offered.    The battery of tests administered was selected by Dr. Nicole Kindred with consideration to the patient's current level of functioning, the nature of her symptoms, emotional and behavioral responses during the interview, level of literacy, observed level of motivation/effort, and the nature of the referral question. This battery was communicated to the psychometrist. Communication between Dr. Nicole Kindred and the psychometrist was ongoing throughout the evaluation and Dr. Nicole Kindred was immediately accessible at all times. Dr. Nicole Kindred provided supervision to the technician on the date of this service, to the extent necessary to assure the quality of all services provided.    Raven [name] will return in approximately one week for an interactive feedback session with Dr. Nicole Kindred, at which time test performance, clinical impressions, and treatment recommendations will be reviewed in detail. The patient understands she can contact our office should she require our assistance before this time.   A total of 115 minutes of billable time were spent with Raven [name] by the technician, including test administration and scoring time. Billing for these services is reflected in Dr. Les Pou note.   This note reflects time spent with the psychometrician and does not include test scores, clinical history, or any interpretations made by Dr. Nicole Kindred. The full report will follow in a separate note.

## 2021-01-18 NOTE — Progress Notes (Signed)
[facility]  Patient Name: Raven [name] MRN: [id number] Date of Birth: [birth year] Age: 80 y.o. Education: 16 years  Measurement properties of test scores: IQ, Index, and Standard Scores (SS): Mean = 100; Standard Deviation = 15 Scaled Scores (Ss): Mean = 10; Standard Deviation = 3 Z scores (Z): Mean = 0; Standard Deviation = 1 T scores (T); Mean = 50; Standard Deviation = 10  TEST SCORES:    Note: This summary of test scores accompanies the interpretive report and should not be interpreted by unqualified individuals or in isolation without reference to the report. Test scores are relative to age, gender, and educational history as available and appropriate.   Performance Validity        "A" Random Letter Test Raw  Descriptor      Errors 0 Within Expectation  The Dot Counting Test: 9 Within Expectation      Embedded Measures: Raw  Descriptor      NAB Effort Index 0 Within Expectation      Mental Status Screening     Total Score Descriptor  MoCA 28 Normal      Expected Functioning        Wide Range Achievement Test: Standard/Scaled Score Percentile      Word Reading 109 73      Reynolds Intellectual Screening Test Standard/T-score Percentile      Guess What 64 92      Odd Item Out 71 98  RIST Index 133 99      Attention/Processing Speed        Neuropsychological Assessment Battery (Attention Module, Form 1): Scaled/T-score Percentile      Digits Forward 55 69      Digits Backwards 51 54      Repeatable Battery for the Assessment of Neuropsychological Status (Form A): Standard Score Percentile     Coding 9 37      Language        Neuropsychological Assessment Battery (Language Module, Form 1): T-score Percentile      Naming   (31) 60 84      Verbal Fluency:  T Score Percentile      Controlled Oral Word Association (F-A-S) 51 54      Semantic Fluency (Animals) 47 38      Memory:        Neuropsychological Assessment  Battery (Memory Module, Form 1): T-score/Standard Score Percentile  Memory Index (MEM): 106 66      List Learning           List A Immediate Recall   (7 , 8 , 8) 48 42         List B Immediate Recall   (3) 40 16         List A Short Delayed Recall   (9) 56 73         List A Long Delayed Recall   (10) 61 86         List A Percent Retention   (111 %) --- 66         List A Long Delayed Yes/No Recognition Hits   (11) --- 58         List A Long Delayed Yes/No Recognition False Alarms   (0) --- 93         List A Recognition Discriminability Index --- 93      Shape Learning           Immediate Recognition   (  6 , 5 , 4) 49 46         Delayed Recognition   (6) 53 62         Percent Retention   (150 %) --- 92         Delayed Forced-Choice Recognition Hits   (6) --- 16         Delayed Forced-Choice Recognition False Alarms   (0) --- 75         Delayed Forced-Choice Recognition Discriminability --- 46      Story Learning           Immediate Recall   (15, 34) 34 5         Delayed Recall   (36) 55 69         Percent Retention   (106 %) --- 79      Daily Living Memory            Immediate Recall   (25, 23) 63 91          Delayed Recall   (9, 8) 61 86          Percent Retention (100 %) --- 84          Recognition Hits   (9) --- 69      Repeatable Battery for the Assessment of Neuropsychological Status (Form A): Scaled Score Percentile         Figure Recall   (11) 9 37      Visuospatial/Constructional Functioning        Repeatable Battery for the Assessment of Neuropsychological Status (Form A): Standard/Scaled Score Percentile      Visuospatial/Constructional Index 116 86         Figure Copy   (19) 12 75         Judgment of Line Orientation   (19) --- >75      Executive Functioning        Modified [place] Card Sorting Test (MWCST): Standard/T-Score Percentile      Number of Categories Correct 57 75      Number of Perseverative Errors 63 91      Number of Total Errors 73 99      Percent  Perseverative Errors 62 88  Executive Function Composite 117 87      Trail Making Test: T-Score Percentile      Part A 67 96      Part B 56 73      Boston Diagnostic Aphasia Exam: Raw Score Scaled Score      Complex Ideational Material 11 9      Clock Drawing Raw Score Descriptor      Command 10 WNL      Rating Scales        Clinical Dementia Rating Raw Score Descriptor      Sum of Boxes 0.5 Mild Cognitive Impairment      Global Score 0.5 MCI      Quick Dementia Rating System Raw Score Descriptor      Sum of Boxes 1 MCI      Total Score 1.5 MCI  Geriatric Depression Scale - Short Form 3 Negative   Elanie Hammitt V. Nicole Kindred PsyD, [place] Clinical Neuropsychologist

## 2021-01-19 ENCOUNTER — Encounter: Payer: Self-pay | Admitting: Counselor

## 2021-01-19 NOTE — Progress Notes (Signed)
[facility]  Patient Name: Raven [name] MRN: [id number] Date of Birth: 10/24/1941 Age: 80 y.o. Education: 16 years  Clinical Impressions  Raven [name] is a 80 y.o., right-hand dominant, widowed woman with a history of concerns about memory and thinking for nearly 20 years. She has a strong family history of Alzheimer's disease with the condition having affected several maternal relatives including her mother, grandmother, and great grandmother. She was evaluated previously by Dr. Bonita Quin in 2019, who noted normal performance in all areas but did think the patient had a mild generalized anxiety disorder. She admits that she tries to prove things to herself and tries to push herself, partly because she is concerned she will develop Alzheimer's. For instance she is traveling to [place] alone, although this has now become a significant source of anxiety for her. Her daughter thinks she is too busy and takes on too much and has noticed perhaps very mild benign memory problems that do not interfere in any way in the patient's life. She has no neuroimaging on file for review.   On neuropsychological testing, Raven [name] demonstrated truly exceptional, very superior levels of overall cognitive ability. As compared to her previous evaluation, there are areas of possible lower performance on semantic verbal fluency, timed number-symbol coding, short story learning and recall, and perhaps also word list learning, but her performance in all areas was still within normal limits. There are also instrumentation differences that may be a factor and there is no pattern of significant differences concerning for a clear decline. Her memory fell at an average level overall and she did very well on complex measures of executive abilities with no significant changes as compared to the prior evaluation.  She performed well on other measures that tend to be specific for Alzheimer's  disease including naming and semantic verbal fluency. Her performance was good with average to high average scores on measures of attention and processing efficiency, executive function, and visuospatial/constructional tests. She screened negative for the presence of depression.   Raven [name] is thus doing very well cognitively and is an individual of very significant premorbid ability. Anxiety about dementia is common in caregivers/family members of demented individuals and I will reassure her, because neither her clinical history nor her cognitive test data are suggestive of a neuropsychologically identifiable cognitive disorder at this time. I do think she has a level of worry about AD significant enough for a diagnosis of illness anxiety disorder (she did not present as generally anxious to me). She is interested in preventation strategies and may benefit from discussion of MIND-DASH diet. She is already exercising and the importance of exercise for cognitive health will also be emphasized.  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 You demonstrated quite impressive, very superior levels of overall cognitive ability, which sets a stringent standard for your cognitive test data. Even within that expectation you performed normally and there is no indication of any decline whatsoever. While this does not definitively rule out that some decline has occurred, it does provide strong evidence against an impression of dementia or a clinically detectable pre-dementia syndrome.    Avoid overfocusing on cognitive performance. Memory and cognition are notoriously fallible and if you are looking for cognitive problems, you are bound to find them. Once someone gets worried about their memory and thinking,  they may  overfocus on how they are doing day-to-day, and then when normal day-to-day cognitive errors are made, this becomes a cause for more concern. This concern and anxiety then decreases focus from the task at hand, reducing concentration, causing more cognitive problems, and creating a vicious cycle. Rather than critiquing your performance, I would encourage you to remain present minded and focus on the task at hand. Perhaps most importantly, have reasonable expectations for yourself.  Healthy people forget things, lose focus, and do not perform 100% correctly all the time. Some cognitive errors are normal and are not necessarily a sign that there is something wrong with your brain.   Often times, individuals who have taken care of those with dementia or have had extensive family experience with it are the most nervous about developing it themselves. I would like to reassure you that none of your test data even fall in the Mild Cognitive Impairment category (I.e., are lower than expected for you) and there is no indication that you arte at greater risk of developing dementia than anyone else in your particular age cohort.  Having first degree relatives with late-onset dementia may confer some minor additional risk, but it is important to know that age is always the biggest risk-factor for dementia even if an individual does have a "genetic" loading for it. Your family history is not concerning for a pattern suggesting autosomal dominant Alzheimer's, which typically occurs in multiple family members over the course of generations and presents at an early age.   There is now good quality evidence from at least one large scale study that a modified mediterranean diet may help slow cognitive decline. This is known as the "MIND" diet. The Mind diet is not so much a specific diet as it is a set of recommendations for things that you should and should not eat.   Foods that are ENCOURAGED on the MIND Diet:  Green,  leafy vegetables: Aim for six or more servings per week. This includes kale, spinach, cooked greens and salads.  All other vegetables: Try to eat another vegetable in addition to the green leafy vegetables at least once a day. It is best to choose non-starchy vegetables because they have a lot of nutrients with a low number of calories.  Berries: Eat berries at least twice a week. There is a plethora of research on strawberries, and other berries such as blueberries, raspberries and blackberries have also been found to have antioxidant and brain health benefits.  Nuts: Try to get five servings of nuts or more each week. The creators of the [place] don't specify what kind of nuts to consume, but it is probably best to vary the type of nuts you eat to obtain a variety of nutrients. Peanuts are a legume and do not fall into this category.  Olive oil: Use olive oil as your main cooking oil. There may be other heart-healthy alternatives such as algae oil, though there is not yet sufficient research upon which to base a formal recommendation.  Whole grains: Aim for at least three servings daily. Choose minimally processed grains like oatmeal, quinoa, brown rice, whole-wheat pasta and 100% whole-wheat bread.  Fish: Eat fish at least once a week. It is best to choose fatty fish like salmon, sardines, trout, tuna and mackerel for their high amounts of omega-3 fatty acids.  Beans: Include beans in at least four meals every week. This includes all beans, lentils and soybeans.  Poultry: Try to eat chicken  or [place] at least twice a week. Note that fried chicken is not encouraged on the MIND diet.  Wine: Aim for no more than one glass of alcohol daily. Both red and white wine may benefit the brain. However, much research has focused on the red wine compound resveratrol, which may help protect against Alzheimer's disease.  Foods that are DISCOURAGED on the MIND Diet: Butter and margarine: Try to eat less than 1  tablespoon (about 14 grams) daily. Instead, try using olive oil as your primary cooking fat, and dipping your bread in olive oil with herbs.  Cheese: The MIND diet recommends limiting your cheese consumption to less than once per week.  Red meat: Aim for no more than three servings each week. This includes all beef, pork, lamb and products made from these meats.  [place] food: The MIND diet highly discourages fried food, especially the kind from fast-food restaurants. Limit your consumption to less than once per week.  Pastries and sweets: This includes most of the processed junk food and desserts you can think of. Ice cream, cookies, brownies, snack cakes, donuts, candy and more. Try to limit these to no more than four times a week.  Exercise is one of the best medicines for promoting health and maintaining cognitive fitness at all stages in life. Exercise probably has the largest documented effect on brain health and performance of any lifestyle intervention. Studies have shown that even previously sedentary individuals who start exercising as late as age 48 show a significant survival benefit as compared to their non-exercising peers. In the [place], the current guidelines are for 30 minutes of moderate exercise per day, but increasing your activity level less than that may also be helpful. You do not have to get your 30 minutes of exercise in one shot and exercising for short periods of time spread throughout the day can be helpful. Go for several walks, learn to dance, or do something else you enjoy that gets your body moving. Of course, if you have an underlying medical condition or there is any question about whether it is safe for you to exercise, you should consult a medical treatment provider prior to beginning exercise.   In terms of challenging yourself, I do not think you need to go to any extra effort. You are already involved in numerous activities at [facility]. In general, I recommend  that people engage in challenging activities that are as close to the real world activity they want to improve as possible, instead of playing brain games or other artificial situations. This means that living your best life is the best practice.   Test Findings  Test scores are summarized in additional documentation associated with this encounter. Test scores are relative to age, gender, and educational history as available and appropriate. There were no concerns about performance validity as all findings fell within normal expectations.   General Intellectual Functioning/Achievement:  Performance on single word reading was toward the upper end of the average range. By contrast, her performance was exceptional on the RIST index, falling at a very superior level. She performed at a high average level on the verbally mediated subtest and a very superior level on the more visually oriented subtest. This was used as a standard of comparison for her memory test performance.   Attention and Processing Efficiency: Performance on indicators of attention was good with average performance on digit repetition forward and digit repetition backward. Timed indicators of processing efficiency also generated good scores  with average timed number-symbol coding and superior range simple numeric sequencing.   Language: Language findings were within normal limits with errorless visual object confrontation naming. Generation of words was average both in response to the letters F-A-S and in response to the category prompt "animals."   Visuospatial Function: Performance on visuospatial and constructional measures was nearly errorless, with a high average score on the overall index. Copy of a line drawing was average, whereas judgment of angular line orientations was high average.   Learning and Memory: Performance on measures of learning and memory was within normal expectations for this patient, with an average range  score overall. She performed at an average level on word list learning with strong retention of information across time, average short delayed recall, and high average long-delayed recall. Her recognition for words from the list versus false choices was nearly errorless with discriminability in the superior range. Short story learning was somewhat less robust, with an unusually low score, although her retention across time was good and her delayed recall was average. Immediate recall for daily-living type information was high average for both immediate and delayed recall. Recognition of the information was good and in the average range.   In the visual realm, her immediate recognition for a series of designs that are difficult to verbally encode was average with comparable average range delayed recognition. Yes/no recognition for the designs versus false choices was average.   Executive Functions: Performance was good on all measures within this domain, with a high average Therapist, music on the [employer]. Alternating sequencing of numbers and letters of the alphabet was average, nearly high average. Generation of words in response to the letters F-A-S was average. Reasoning with verbal information was average on the Complex Ideational Material.   Rating Scale(s): Raven [name] screened negative for the presence of depression on self-rating of symptoms. Her daughter characterized her as functioning at a questionable cognitive impairment level. Her CDR is 0.5, but that is only because she is complaining of memory problems and her Sum of Box score is in the questionable impairment range.   Viviano Simas Nicole Kindred PsyD, [place] Clinical Neuropsychologist

## 2021-01-25 ENCOUNTER — Encounter: Payer: Medicare Other | Admitting: Counselor

## 2021-01-26 ENCOUNTER — Encounter: Payer: Self-pay | Admitting: Counselor

## 2021-01-26 ENCOUNTER — Ambulatory Visit (INDEPENDENT_AMBULATORY_CARE_PROVIDER_SITE_OTHER): Payer: Medicare Other | Admitting: Counselor

## 2021-01-26 ENCOUNTER — Other Ambulatory Visit: Payer: Self-pay

## 2021-01-26 DIAGNOSIS — F4521 Hypochondriasis: Secondary | ICD-10-CM | POA: Diagnosis not present

## 2021-01-26 NOTE — Progress Notes (Signed)
NEUROPSYCHOLOGY FEEDBACK NOTE [facility]  Feedback Note: I met with Raven [name] to review the findings resulting from her neuropsychological evaluation. Since the last appointment, she read an [employer] article that discussed MCI and she thinks she may have it. Otherwise, she is about the same. Time was spent reviewing the impressions and recommendations that are detailed in the evaluation report. We discussed impression of normal cognitive performance, as reflected in the patient instructions. I discussed with her the challenges in interpreting change over time using neuropsychological tests. I was candid with her that there are some areas that are marginally weaker but I do not think they likely exceed measurement error and the important thing is that her scores are still normal for her. I took time to explain the findings and answer all the patient's questions. I encouraged Raven [name] to contact me should she have any further questions or if further follow up is desired.   Current Medications and Medical History   Current Outpatient Medications  Medication Sig Dispense Refill  . aspirin-acetaminophen-caffeine (EXCEDRIN MIGRAINE) 250-250-65 MG per tablet Take 1 tablet by mouth every 6 (six) hours as needed for headache.    . Biotin 2500 MCG CAPS Take by mouth.    . calcium-vitamin D (OSCAL WITH D) 500-200 MG-UNIT per tablet Take 1 tablet by mouth daily with breakfast.    . folic acid (FOLVITE) 338 MCG tablet Take 400 mcg by mouth daily. (Patient not taking: Reported on 01/03/2021)    . ibuprofen (ADVIL,MOTRIN) 200 MG tablet Take 200-400 mg by mouth every 6 (six) hours as needed for mild pain or moderate pain.    Marland Kitchen levothyroxine (SYNTHROID) 75 MCG tablet TAKE 1 TAB BY MOUTH ONCE DAILY. TAKE ON AN EMPTY STOMACH WITH A GLASS OF WATER ATLEAST 30-60 MINUTES BEFORE BREAKFAST 90 tablet 1  . Multiple Vitamin (MULTIVITAMIN WITH MINERALS) TABS tablet Take 1 tablet by mouth daily.    . simvastatin  (ZOCOR) 40 MG tablet TAKE 1 TABLET BY MOUTH AT BEDTIME 90 tablet 1   No current facility-administered medications for this visit.    Patient Active Problem List   Diagnosis Date Noted  . 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 Elevated blood pressure reading without diagnosis of hypertension 07/11/2019  . Cataracts, bilateral 01/08/2019  . Chronic right hip pain 01/08/2019  . Hypothyroidism 04/09/2015  . Constipation 04/09/2015  . Counseling about travel 04/09/2015  . 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 Carotid stenosis 01/10/2015  . S/P carpal tunnel release 04/11/2014  . S/P parathyroidectomy ([place]) 03/23/2014  . Hypercalcemia 09/02/2013  . Hyperlipidemia 09/01/2013  . Medicare annual wellness visit, subsequent 12/06/2012  . Screening for breast cancer 12/03/2011  . Osteopenia 12/03/2011  . Screening for cervical cancer 12/03/2011  . Screening for colon cancer 12/03/2011  . Migraine headache 12/03/2011  . Varicose veins of legs 12/03/2011    Mental Status and Behavioral Observations  Raven [name] presented on time to the present encounter and was alert and generally oriented. Speech was normal in rate, rhythm, volume, and prosody. Self-reported mood was "good" and affect was neutral. Thought process was logical and goal oriented and thought content was appropriate to the topics discussed. There were no safety concerns identified at today's encounter, such as thoughts of harming self or others.   Plan  Feedback provided regarding the patient's neuropsychological evaluation. Patient was interested in and accepted a referral for counseling. 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 Raven [name] was encouraged to contact me if any questions arise or if further follow up is desired.   Viviano Simas  Nicole Kindred, PsyD, ABN Clinical Neuropsychologist  Service(s) Provided at This Encounter: 35 minutes [phone]; Conjoint therapy with patient present)

## 2021-01-26 NOTE — Patient Instructions (Signed)
Your performance and presentation on assessment today were consistent with normal abilities in all areas. You demonstrated quite impressive, very superior levels of overall cognitive ability, which sets a stringent standard for your cognitive test data. Even within that expectation you performed normally and there is no indication of any decline whatsoever. While this does not definitively rule out that some decline has occurred, it does provide strong evidence against an impression of dementia or a clinically detectable pre-dementia syndrome.    Avoid overfocusing on cognitive performance. Memory and cognition are notoriously fallible and if you are looking for cognitive problems, you are bound to find them. Once someone gets worried about their memory and thinking, they may overfocus on how they are doing day-to-day, and then when normal day-to-day cognitive errors are made, this becomes a cause for more concern. This concern and anxiety then decreases focus from the task at hand, reducing concentration, causing more cognitive problems, and creating a vicious cycle. Rather than critiquing your performance, I would encourage you to remain present minded and focus on the task at hand. Perhaps most importantly, have reasonable expectations for yourself.  Healthy people forget things, lose focus, and do not perform 100% correctly all the time. Some cognitive errors are normal and are not necessarily a sign that there is something wrong with your brain.   Often times, individuals who have taken care of those with dementia or have had extensive family experience with it are the most nervous about developing it themselves. I would like to reassure you that none of your test data even fall in the Mild Cognitive Impairment category (I.e., are lower than expected for you) and there is no indication that you arte at greater risk of developing dementia than anyone else in your particular age cohort.  Having first  degree relatives with late-onset dementia may confer some minor additional risk, but it is important to know that age is always the biggest risk-factor for dementia even if an individual does have a "genetic" loading for it. Your family history is not concerning for a pattern suggesting autosomal dominant Alzheimer's, which typically occurs in multiple family members over the course of generations and presents at an early age.   There is now good quality evidence from at least one large scale study that a modified mediterranean diet may help slow cognitive decline. This is known as the "MIND" diet. The Mind diet is not so much a specific diet as it is a set of recommendations for things that you should and should not eat.   Foods that are ENCOURAGED on the MIND Diet:  Green, leafy vegetables: Aim for six or more servings per week. This includes kale, spinach, cooked greens and salads.  All other vegetables: Try to eat another vegetable in addition to the green leafy vegetables at least once a day. It is best to choose non-starchy vegetables because they have a lot of nutrients with a low number of calories.  Berries: Eat berries at least twice a week. There is a plethora of research on strawberries, and other berries such as blueberries, raspberries and blackberries have also been found to have antioxidant and brain health benefits.  Nuts: Try to get five servings of nuts or more each week. The creators of the [place] don't specify what kind of nuts to consume, but it is probably best to vary the type of nuts you eat to obtain a variety of nutrients. Peanuts are a legume and do not fall into this  category.  Olive oil: Use olive oil as your main cooking oil. There may be other heart-healthy alternatives such as algae oil, though there is not yet sufficient research upon which to base a formal recommendation.  Whole grains: Aim for at least three servings daily. Choose minimally processed grains like  oatmeal, quinoa, brown rice, whole-wheat pasta and 100% whole-wheat bread.  Fish: Eat fish at least once a week. It is best to choose fatty fish like salmon, sardines, trout, tuna and mackerel for their high amounts of omega-3 fatty acids.  Beans: Include beans in at least four meals every week. This includes all beans, lentils and soybeans.  Poultry: Try to eat chicken or [place] at least twice a week. Note that fried chicken is not encouraged on the MIND diet.  Wine: Aim for no more than one glass of alcohol daily. Both red and white wine may benefit the brain. However, much research has focused on the red wine compound resveratrol, which may help protect against Alzheimer's disease.  Foods that are DISCOURAGED on the MIND Diet: Butter and margarine: Try to eat less than 1 tablespoon (about 14 grams) daily. Instead, try using olive oil as your primary cooking fat, and dipping your bread in olive oil with herbs.  Cheese: The MIND diet recommends limiting your cheese consumption to less than once per week.  Red meat: Aim for no more than three servings each week. This includes all beef, pork, lamb and products made from these meats.  [place] food: The MIND diet highly discourages fried food, especially the kind from fast-food restaurants. Limit your consumption to less than once per week.  Pastries and sweets: This includes most of the processed junk food and desserts you can think of. Ice cream, cookies, brownies, snack cakes, donuts, candy and more. Try to limit these to no more than four times a week.  Exercise is one of the best medicines for promoting health and maintaining cognitive fitness at all stages in life. Exercise probably has the largest documented effect on brain health and performance of any lifestyle intervention. Studies have shown that even previously sedentary individuals who start exercising as late as age 85 show a significant survival benefit as compared to their non-exercising  peers. In the [place], the current guidelines are for 30 minutes of moderate exercise per day, but increasing your activity level less than that may also be helpful. You do not have to get your 30 minutes of exercise in one shot and exercising for short periods of time spread throughout the day can be helpful. Go for several walks, learn to dance, or do something else you enjoy that gets your body moving. Of course, if you have an underlying medical condition or there is any question about whether it is safe for you to exercise, you should consult a medical treatment provider prior to beginning exercise.   In terms of challenging yourself, I do not think you need to go to any extra effort. You are already involved in numerous activities at [employer]. In general, I recommend that people engage in challenging activities that are as close to the real world activity they want to improve as possible, instead of playing brain games or other artificial situations. This means that living your best life is the best practice.

## 2021-02-03 IMAGING — MG DIGITAL SCREENING BILATERAL MAMMOGRAM WITH TOMO AND CAD
8 series · 8 of 24 positions shown · non-contrast
Comparison: Previous exam(s).

CLINICAL DATA: Screening.

EXAM:
DIGITAL SCREENING BILATERAL MAMMOGRAM WITH TOMO AND CAD

[R CC synth-2D]
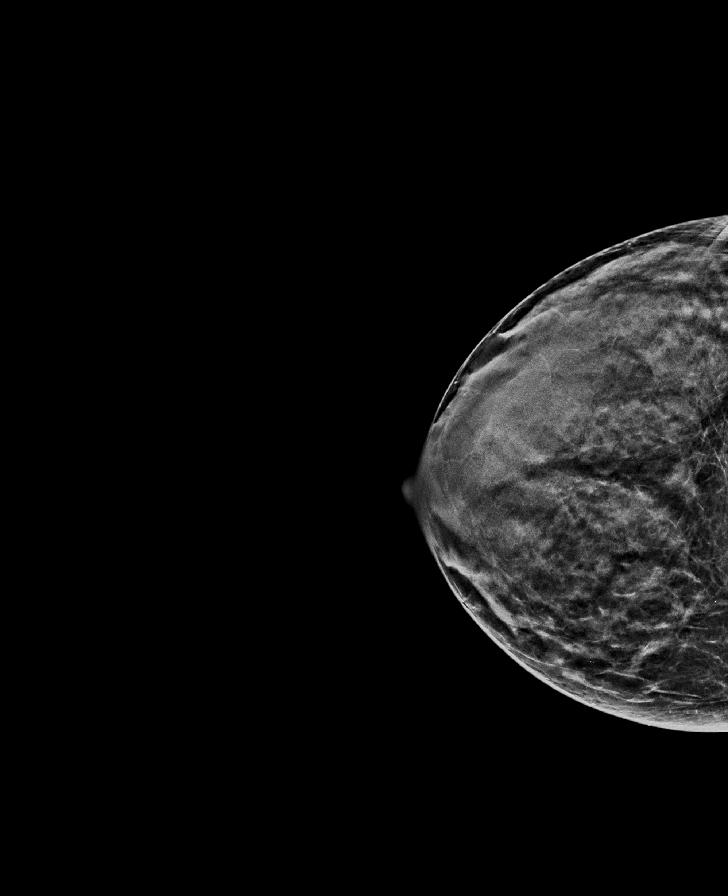

[L CC synth-2D]
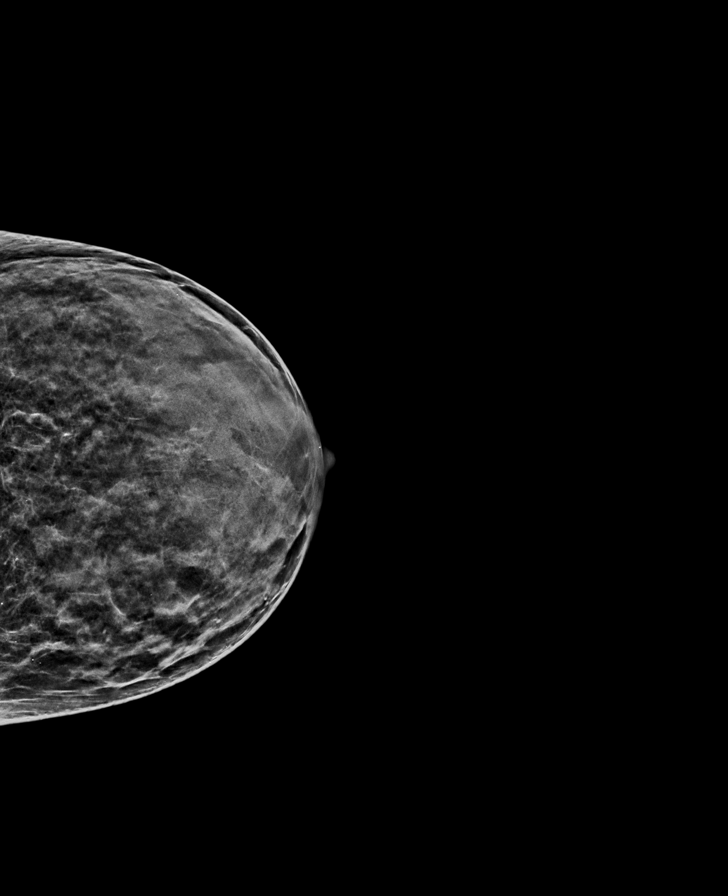

[L MLO synth-2D]
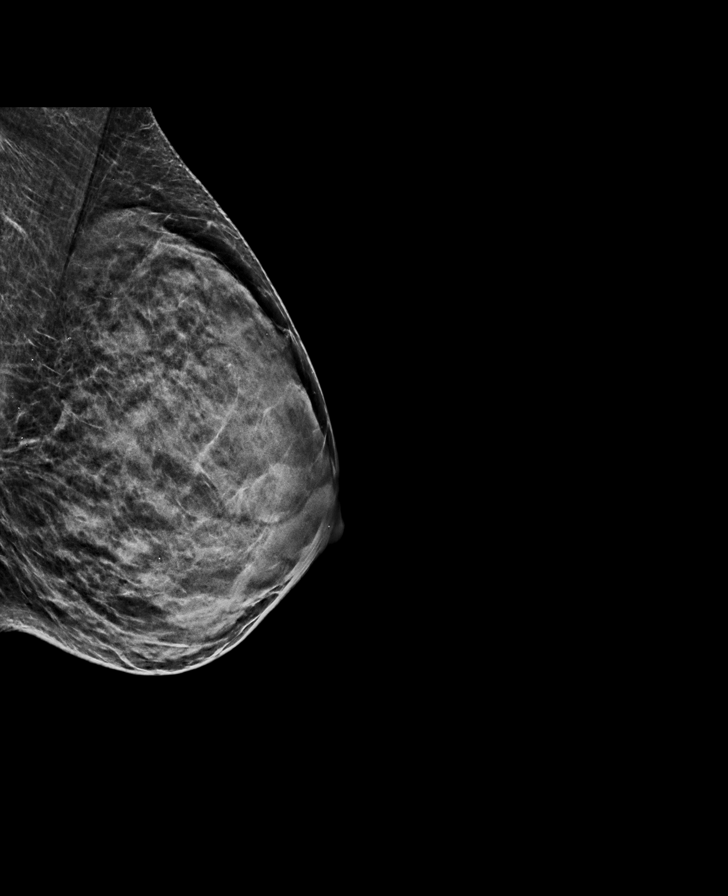

[R MLO synth-2D]
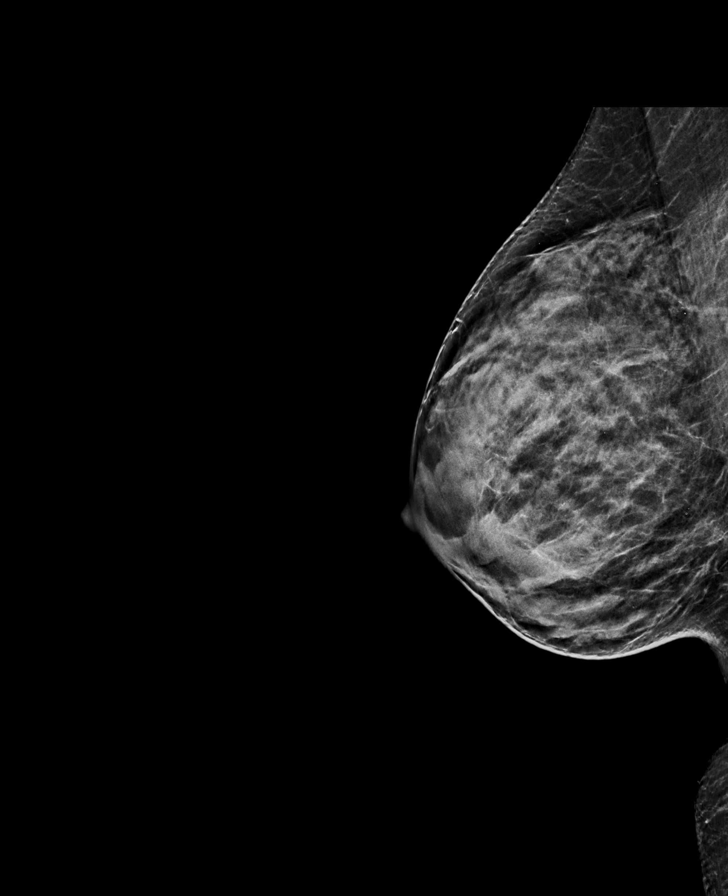

[L MLO tomo · tomo slice 25/50.0]
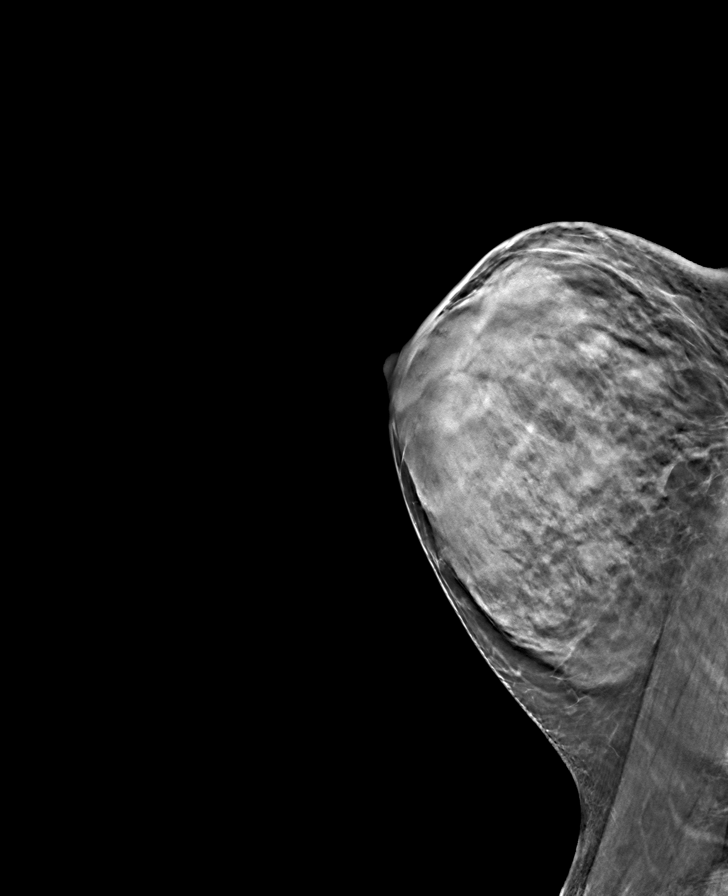

[R CC tomo · tomo slice 27/52.0]
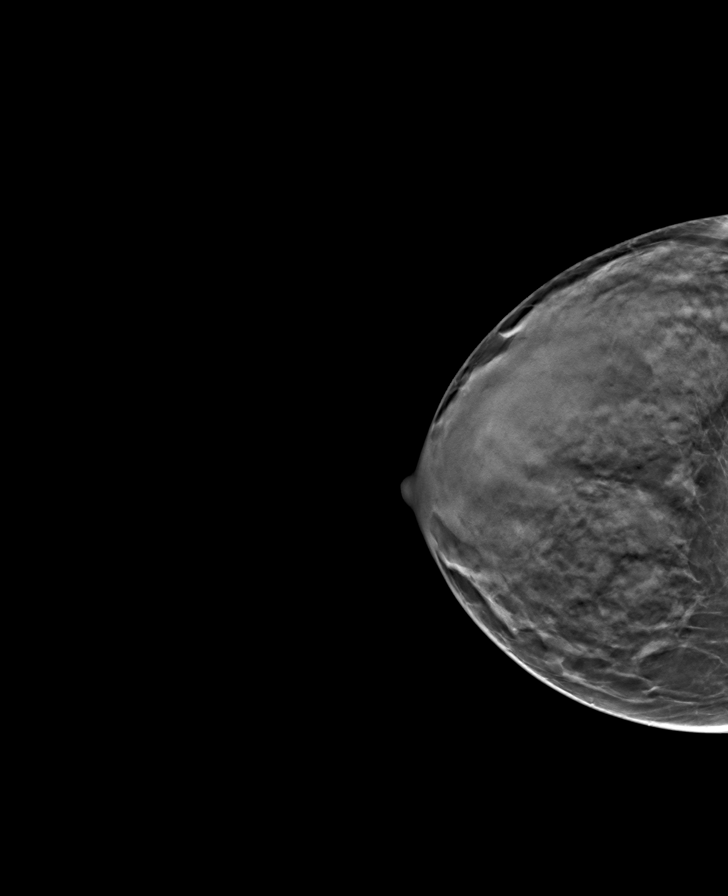

[R MLO tomo · tomo slice 27/54.0]
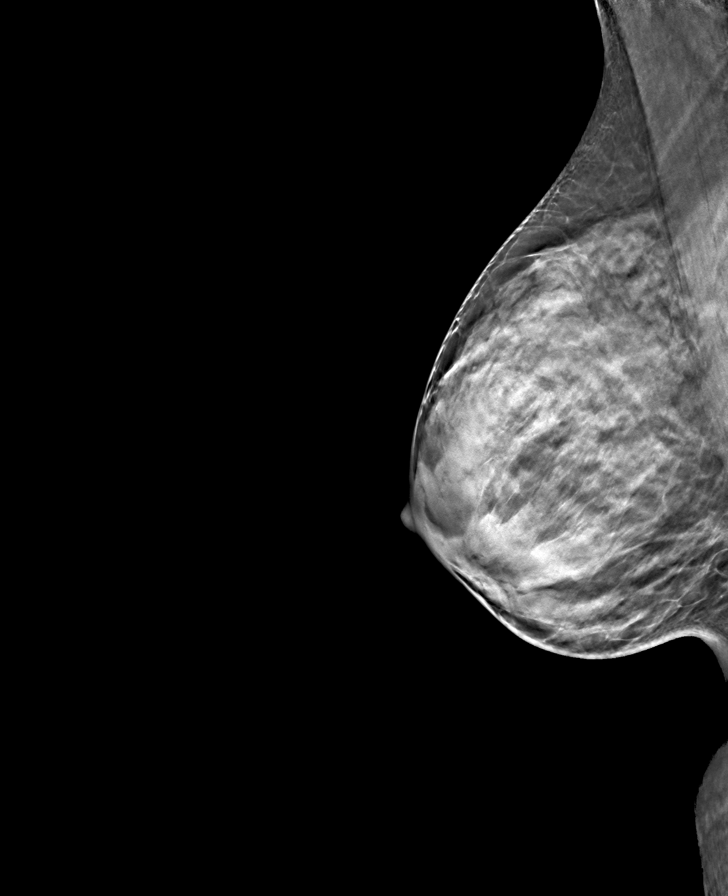

[L CC tomo · tomo slice 24/47.0]
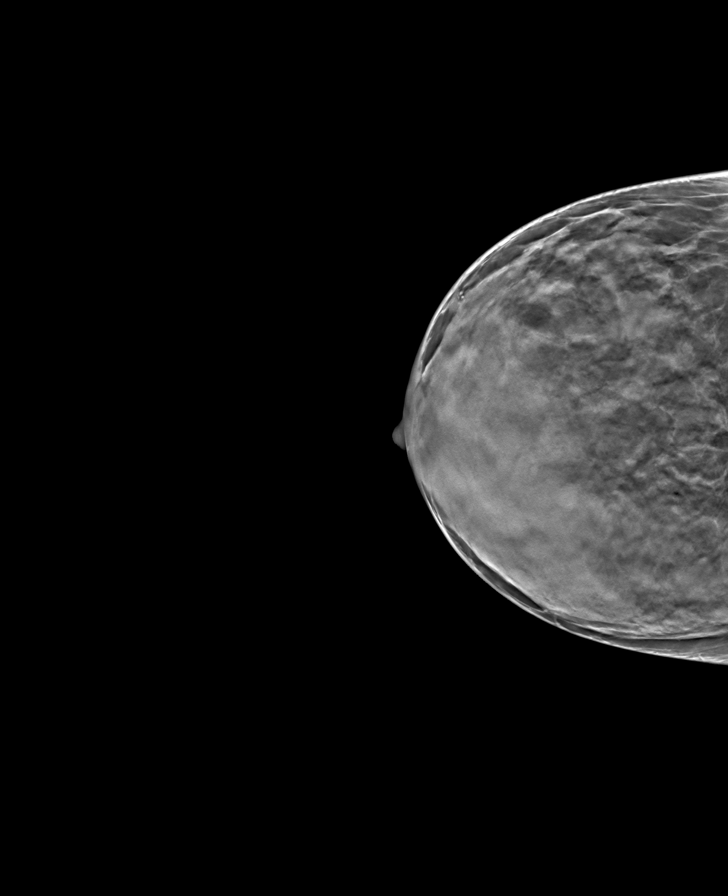

[8 of 24 positions shown; findings below may reference images not displayed]

ACR Breast Density Category d: The breast tissue is extremely dense,
which lowers the sensitivity of mammography
FINDINGS: There are no findings suspicious for malignancy. Images were
processed with CAD.
IMPRESSION: No mammographic evidence of malignancy. A result letter of this
screening mammogram will be mailed directly to the patient.

RECOMMENDATION:
Screening mammogram in one year. (Code:[id number]-[id number])

BI-RADS CATEGORY  1: Negative.

## 2021-02-06 ENCOUNTER — Encounter: Payer: Medicare Other | Admitting: Dermatology

## 2021-02-17 ENCOUNTER — Other Ambulatory Visit: Payer: Self-pay

## 2021-02-17 ENCOUNTER — Telehealth: Payer: Self-pay

## 2021-02-17 ENCOUNTER — Ambulatory Visit (INDEPENDENT_AMBULATORY_CARE_PROVIDER_SITE_OTHER): Payer: Medicare Other | Admitting: Internal Medicine

## 2021-02-17 ENCOUNTER — Encounter: Payer: Self-pay | Admitting: Internal Medicine

## 2021-02-17 VITALS — BP 134/72 | HR 65 | Temp 97.7°F | Ht 62.0 in | Wt 125.8 lb

## 2021-02-17 DIAGNOSIS — M65331 Trigger finger, right middle finger: Secondary | ICD-10-CM

## 2021-02-17 DIAGNOSIS — F418 Other specified anxiety disorders: Secondary | ICD-10-CM

## 2021-02-17 DIAGNOSIS — R200 Anesthesia of skin: Secondary | ICD-10-CM

## 2021-02-17 DIAGNOSIS — Z1231 Encounter for screening mammogram for malignant neoplasm of breast: Secondary | ICD-10-CM

## 2021-02-17 DIAGNOSIS — K5900 Constipation, unspecified: Secondary | ICD-10-CM

## 2021-02-17 DIAGNOSIS — E034 Atrophy of thyroid (acquired): Secondary | ICD-10-CM | POA: Diagnosis not present

## 2021-02-17 DIAGNOSIS — Z7184 Encounter for health counseling related to travel: Secondary | ICD-10-CM

## 2021-02-17 DIAGNOSIS — R202 Paresthesia of skin: Secondary | ICD-10-CM | POA: Diagnosis not present

## 2021-02-17 DIAGNOSIS — E559 Vitamin D deficiency, unspecified: Secondary | ICD-10-CM | POA: Diagnosis not present

## 2021-02-17 DIAGNOSIS — E782 Mixed hyperlipidemia: Secondary | ICD-10-CM | POA: Diagnosis not present

## 2021-02-17 DIAGNOSIS — I6529 Occlusion and stenosis of unspecified carotid artery: Secondary | ICD-10-CM | POA: Diagnosis not present

## 2021-02-17 LAB — CBC WITH DIFFERENTIAL/PLATELET
Basophils Absolute: 0 10*3/uL (ref 0.0–0.1)
Basophils Relative: 0.9 % (ref 0.0–3.0)
Eosinophils Absolute: 0.1 10*3/uL (ref 0.0–0.7)
Eosinophils Relative: 3.2 % (ref 0.0–5.0)
HCT: 37.7 % (ref 36.0–46.0)
Hemoglobin: 12.9 g/dL (ref 12.0–15.0)
Lymphocytes Relative: 29.6 % (ref 12.0–46.0)
Lymphs Abs: 1.3 10*3/uL (ref 0.7–4.0)
MCHC: 34.3 g/dL (ref 30.0–36.0)
MCV: 94.3 fl (ref 78.0–100.0)
Monocytes Absolute: 0.3 10*3/uL (ref 0.1–1.0)
Monocytes Relative: 7.6 % (ref 3.0–12.0)
Neutro Abs: 2.6 10*3/uL (ref 1.4–7.7)
Neutrophils Relative %: 58.7 % (ref 43.0–77.0)
Platelets: 236 10*3/uL (ref 150.0–400.0)
RBC: 3.99 Mil/uL (ref 3.87–5.11)
RDW: 13.6 % (ref 11.5–15.5)
WBC: 4.4 10*3/uL (ref 4.0–10.5)

## 2021-02-17 LAB — LIPID PANEL
Cholesterol: 217 mg/dL — ABNORMAL HIGH (ref 0–200)
HDL: 79.7 mg/dL (ref >39.00)
LDL Cholesterol: 124 mg/dL — ABNORMAL HIGH (ref 0–99)
NonHDL: 137.23
Total CHOL/HDL Ratio: 3
Triglycerides: 68 mg/dL (ref 0.0–149.0)
VLDL: 13.6 mg/dL (ref 0.0–40.0)

## 2021-02-17 LAB — COMPREHENSIVE METABOLIC PANEL
ALT: 22 U/L (ref 0–35)
AST: 26 U/L (ref 0–37)
Albumin: 4.5 g/dL (ref 3.5–5.2)
Alkaline Phosphatase: 49 U/L (ref 39–117)
BUN: 17 mg/dL (ref 6–23)
CO2: 28 mEq/L (ref 19–32)
Calcium: 9.6 mg/dL (ref 8.4–10.5)
Chloride: 105 mEq/L (ref 96–112)
Creatinine, Ser: 0.8 mg/dL (ref 0.40–1.20)
GFR: 70.82 mL/min (ref >60.00)
Glucose, Bld: 96 mg/dL (ref 70–99)
Potassium: 4 mEq/L (ref 3.5–5.1)
Sodium: 142 mEq/L (ref 135–145)
Total Bilirubin: 0.5 mg/dL (ref 0.2–1.2)
Total Protein: 6.9 g/dL (ref 6.0–8.3)

## 2021-02-17 LAB — VITAMIN B12: Vitamin B-12: 617 pg/mL (ref 211–911)

## 2021-02-17 LAB — VITAMIN D 25 HYDROXY (VIT D DEFICIENCY, FRACTURES): VITD: 43.14 ng/mL (ref 30.00–100.00)

## 2021-02-17 LAB — TSH: TSH: 1.63 u[IU]/mL (ref 0.35–4.50)

## 2021-02-17 MED ORDER — ONDANSETRON HCL 4 MG PO TABS: 4.0000 mg | ORAL_TABLET | Freq: Three times a day (TID) | ORAL | 0 refills | Status: DC | PRN

## 2021-02-17 MED ORDER — CIPROFLOXACIN HCL 500 MG PO TABS: 500.0000 mg | ORAL_TABLET | Freq: Two times a day (BID) | ORAL | 0 refills | Status: DC

## 2021-02-17 MED ORDER — ONDANSETRON 4 MG PO TBDP: 4.0000 mg | ORAL_TABLET | Freq: Three times a day (TID) | ORAL | 0 refills | Status: DC | PRN

## 2021-02-17 MED ORDER — AZITHROMYCIN 500 MG PO TABS: 500.0000 mg | ORAL_TABLET | Freq: Every day | ORAL | 0 refills | Status: DC

## 2021-02-17 NOTE — Progress Notes (Signed)
Patient ID: ESRAA SERES, female    DOB: [birth year]  Age: 80 y.o. MRN: [id number]  The patient is here for follow up and management  of other chronic and acute problems. Last seen Feb 2021    The risk factors are reflected in the social history.  The roster of all physicians providing medical care to patient - is listed in the Snapshot section of the chart.  Activities of daily living:  The patient is 100% independent in all ADLs: dressing, toileting, feeding as well as independent mobility  Home safety : The patient has smoke detectors in the home. They wear seatbelts.  There are no firearms at home. There is no violence in the home.   There is no risks for hepatitis, STDs or HIV. There is no   history of blood transfusion. They have no travel history to infectious disease endemic areas of the world.  The patient has seen their dentist in the last six month. They have seen their eye doctor in the last year. She denies hearing difficulty with regard to whispered voices  And has  deferred audiologic testing in the last year.  They do not  have excessive sun exposure. Discussed the need for sun protection: hats, long sleeves and use of sunscreen if there is significant sun exposure.   Diet: the importance of a healthy diet is discussed. They do have a healthy diet.  The benefits of regular aerobic exercise were discussed. She walks 4 times per week ,  20 minutes.   Depression screen: there are no signs or vegative symptoms of depression- irritability, change in appetite, anhedonia, sadness/tearfullness.  Cognitive assessment: the patient manages all their financial and personal affairs and is actively engaged. She has been persistently worried about developing dementia, and has had a recent comprehensive evaluation by neurology for cognitive complaints, with no deficits found .   The following portions of the patient's history were reviewed and updated as appropriate: allergies, current  medications, past family history, past medical history,  past surgical history, past social history  and problem list.  Visual acuity was not assessed per patient preference since she has regular follow up with her ophthalmologist. Hearing and body mass index were assessed and reviewed.   During the course of the visit the patient was educated and counseled about appropriate screening and preventive services including : fall prevention , diabetes screening, nutrition counseling, colorectal cancer screening, and recommended immunizations.    CC: The primary encounter diagnosis was Mixed hyperlipidemia. Diagnoses of Hypothyroidism due to acquired atrophy of thyroid, Stenosis of carotid artery, unspecified laterality, Vitamin D deficiency, Left leg paresthesias, Constipation, unspecified constipation type, Breast cancer screening by mammogram, Travel advice encounter, Numbness and tingling of both legs below knees, Anxiety about health, and Trigger finger, right middle finger were also pertinent to this visit.   S/p bilateral cataract surgery by Porfilio  In Jan.Feb, no complications Saw ortho for right rotator cuff issues,  Pain waking her up at night was primary complaint.  Advised against surgery  . Completed PT and  Sleeping differently to avoid pain (now sleeping supine)   Capable of ADLS.    Cc:  Left lateral hip and posterior thigh pain .Marland Kitchen Notices that her left foot has begun  turning outward with walking. . Left foot goes  numb with exercise and with prolonged sitting in unupholstered  chairs.  Used to occur just at night    2) right middle finger devoloping an early trigger finger,  Not ready for ortho yet   3) excessive concern about developing dementia bc of family history.  Reviewed neurologist's evaluation  With her. Acknowledges that her anxiety is an issue.  Has appt with clinical psychologist   4) GOING TO [place] FOR 3 WEEKS NEXT MONTH . TRAVELLING ALONE   5) Boyfriend died last Fall  after developing a rapidly progressive dementia.    History Maryssa has a past medical history of Actinic keratoses, Arthritis, Carotid artery occlusion, Fracture (Sep 11 2008), Headache(784.0), Hyperlipidemia, Hypothyroidism, Idiopathic parathyroidism ([place]), Primary hyperparathyroidism ([place]), and Thyroid disease.   She has a past surgical history that includes Carpal tunnel release (2000); Cesarean section; Rotator cuff repair (2013); Parathyroidectomy (Left, 03/23/2014); Cataract extraction w/PHACO (Right, 12/06/2020); and Cataract extraction w/PHACO (Left, 01/03/2021).   Her family history includes Alzheimer's disease in her mother; Arthritis in an other family member; Dementia in her maternal grandmother and mother; Heart attack in her father and mother; Heart disease in her father; Heart failure in her father.She reports that she has never smoked. She has never used smokeless tobacco. She reports current alcohol use of about 7.0 standard drinks of alcohol per week. She reports that she does not use drugs.  Outpatient Medications Prior to Visit  Medication Sig Dispense Refill  . aspirin-acetaminophen-caffeine (EXCEDRIN MIGRAINE) 250-250-65 MG per tablet Take 1 tablet by mouth every 6 (six) hours as needed for headache.    . Biotin 2500 MCG CAPS Take by mouth.    . calcium-vitamin D (OSCAL WITH D) 500-200 MG-UNIT per tablet Take 1 tablet by mouth daily with breakfast.    . folic acid (FOLVITE) 366 MCG tablet Take 400 mcg by mouth daily.    Marland Kitchen ibuprofen (ADVIL,MOTRIN) 200 MG tablet Take 200-400 mg by mouth every 6 (six) hours as needed for mild pain or moderate pain.    Marland Kitchen levothyroxine (SYNTHROID) 75 MCG tablet TAKE 1 TAB BY MOUTH ONCE DAILY. TAKE ON AN EMPTY STOMACH WITH A GLASS OF WATER ATLEAST 30-60 MINUTES BEFORE BREAKFAST 90 tablet 1  . Multiple Vitamin (MULTIVITAMIN WITH MINERALS) TABS tablet Take 1 tablet by mouth daily.    . simvastatin (ZOCOR) 40 MG tablet TAKE 1 TABLET BY MOUTH AT BEDTIME 90  tablet 1   No facility-administered medications prior to visit.    Review of Systems  Patient denies headache, fevers, malaise, unintentional weight loss, skin rash, eye pain, sinus congestion and sinus pain, sore throat, dysphagia,  hemoptysis , cough, dyspnea, wheezing, chest pain, palpitations, orthopnea, edema, abdominal pain, nausea, melena, diarrhea, constipation, flank pain, dysuria, hematuria, urinary  Frequency, nocturia, numbness, tingling, seizures,  Focal weakness, Loss of consciousness,  Tremor, insomnia, depression, anxiety, and suicidal ideation.     Objective:  BP 134/72   Pulse 65   Temp 97.7 F (36.5 C) (Oral)   Ht 5\' 2"  (1.575 m)   Wt 125 lb 12.8 oz (57.1 kg)   SpO2 97%   BMI 23.01 kg/m   Physical Exam  General appearance: alert, cooperative and appears stated age Ears: normal TM's and external ear canals both ears Throat: lips, mucosa, and tongue normal; teeth and gums normal Neck: no adenopathy, no carotid bruit, supple, symmetrical, trachea midline and thyroid not enlarged, symmetric, no tenderness/mass/nodules Back: symmetric, no curvature. ROM normal. No CVA tenderness. Lungs: clear to auscultation bilaterally Heart: regular rate and rhythm, S1, S2 normal, no murmur, click, rub or gallop Abdomen: soft, non-tender; bowel sounds normal; no masses,  no organomegaly Pulses: 2+ and symmetric Skin: Skin color,  texture, turgor normal. No rashes or lesions Lymph nodes: Cervical, supraclavicular, and axillary nodes normal.   Assessment & Plan:   Problem List Items Addressed This Visit      Unprioritized   Hypothyroidism   Relevant Orders   TSH (Completed)   Hyperlipidemia - Primary   Relevant Orders   Lipid panel (Completed)   Anxiety about health    All concerns addressed and reassurance given that she is extremely healthy .  Agree with plans for counselling.       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  Exam is normal.  Reassurance provided.       Travel advice encounter    Discussed her upcoming solo trip to [place].  Empiric antibiotic treatment (not prophylactic) discussed planned and prescriptions for use if she develops bronchitis,  Diarrhea or UTI        Other Visit Diagnoses    Vitamin D deficiency       Relevant Orders   VITAMIN D 25 Hydroxy (Vit-D Deficiency, Fractures) (Completed)   Left leg paresthesias       Relevant Orders   Vitamin B12 (Completed)   Breast cancer screening by mammogram       Relevant Orders   MM 3D SCREEN BREAST BILATERAL      I am having [name] start on azithromycin and ciprofloxacin. I am also having her maintain her calcium-vitamin D, multivitamin with minerals, aspirin-acetaminophen-caffeine, ibuprofen, Biotin, folic acid, levothyroxine, and simvastatin.  Meds ordered this encounter  Medications  . DISCONTD: ondansetron (ZOFRAN ODT) 4 MG disintegrating tablet    Sig: Take 1 tablet (4 mg total) by mouth every 8 (eight) hours as needed for nausea or vomiting.    Dispense:  20 tablet    Refill:  0  . azithromycin (ZITHROMAX) 500 MG tablet    Sig: Take 1 tablet (500 mg total) by mouth daily.    Dispense:  7 tablet    Refill:  0  . ciprofloxacin (CIPRO) 500 MG tablet    Sig: Take 1 tablet (500 mg total) by mouth 2 (two) times daily.    Dispense:  20 tablet    Refill:  0    A total of 40 minutes was spent with patient more than half of which was spent in counseling patient on the above mentioned issues , reviewing and explaining recent labs and imaging studies done, and coordination of care.  There are no discontinued  medications.  Follow-up: Return in about 1 year (around 02/17/2022).   Crecencio Mc, MD

## 2021-02-17 NOTE — Telephone Encounter (Signed)
Landon with [employer] called and asked if you could call her back to change the [facility] without the ODT. -Please call her back at [phone]

## 2021-02-17 NOTE — Patient Instructions (Addendum)
Use the cipro for 3 days for for a UTI,  or for  5 to 10 days for diarrhea after eating anything containing chicken.  Take imodium with you,   Use the azithromycin  For 7 days  for bronchitis or other diarrheas Zofran for nausea   60 grams protein ,  60 ounces water,  Collagen and biotin  Your annual mammogram has been ordered and is due in July .  You are encouraged (required) to call to make your appointment at [facility]  Trigger Finger  Trigger finger, also called stenosing tenosynovitis,  is a condition that causes a finger to get stuck in a bent position. Each finger has a tendon, which is a tough, cord-like tissue that connects muscle to bone, and each tendon passes through a tunnel of tissue called a tendon sheath. To move your finger, your tendon needs to glide freely through the sheath. Trigger finger happens when the tendon or the sheath thickens, making it difficult to move your finger. Trigger finger can affect any finger or a thumb. It may affect more than one finger. Mild cases may clear up with rest and medicine. Severe cases require more treatment. What are the causes? Trigger finger is caused by a thickened finger tendon or tendon sheath. The cause of this thickening is not known. What increases the risk? The following factors may make you more likely to develop this condition:  Doing activities that require a strong grip.  Having rheumatoid arthritis, gout, or diabetes.  Being 51-1 years old.  Being female. What are the signs or symptoms? Symptoms of this condition include:  Pain when bending or straightening your finger.  Tenderness or swelling where your finger attaches to the palm of your hand.  A lump in the palm of your hand or on the inside of your finger.  Hearing a noise like a pop or a snap when you try to straighten your finger.  Feeling a catching or locking sensation when you try to straighten your finger.  Being unable to straighten  your finger. How is this diagnosed? This condition is diagnosed based on your symptoms and a physical exam. How is this treated? This condition may be treated by:  Resting your finger and avoiding activities that make symptoms worse.  Wearing a finger splint to keep your finger extended.  Taking NSAIDs, such as ibuprofen, to relieve pain and swelling.  Doing gentle exercises to stretch the finger as told by your health care provider.  Having medicine that reduces swelling and inflammation (steroids) injected into the tendon sheath. Injections may need to be repeated.  Having surgery to open the tendon sheath. This may be done if other treatments do not work and you cannot straighten your finger. You may need physical therapy after surgery. Follow these instructions at home: If you have a splint:  Wear the splint as told by your health care provider. Remove it only as told by your health care provider.  Loosen it if your fingers tingle, become numb, or turn cold and blue.  Keep it clean.  If the splint is not waterproof: ? Do not let it get wet. ? Cover it with a watertight covering when you take a bath or shower. Managing pain, stiffness, and swelling If directed, apply heat to the affected area as often as told by your health care provider. Use the heat source that your health care provider recommends, such as a moist heat pack or a heating pad.  Place a towel between your skin and the heat source.  Leave the heat on for 20-30 minutes.  Remove the heat if your skin turns bright red. This is especially important if you are unable to feel pain, heat, or cold. You may have a greater risk of getting burned. If directed, put ice on the painful area. To do this:  If you have a removable splint, remove it as told by your health care provider.  Put ice in a plastic bag.  Place a towel between your skin and the bag or between your splint and the bag.  Leave the ice on for 20  minutes, 2-3 times a day.      Activity  Rest your finger as told by your health care provider. Avoid activities that make the pain worse.  Return to your normal activities as told by your health care provider. Ask your health care provider what activities are safe for you.  Do exercises as told by your health care provider.  Ask your health care provider when it is safe to drive if you have a splint on your hand. General instructions  Take over-the-counter and prescription medicines only as told by your health care provider.  Keep all follow-up visits as told by your health care provider. This is important. Contact a health care provider if:  Your symptoms are not improving with home care. Summary  Trigger finger, also called stenosing tenosynovitis, causes your finger to get stuck in a bent position. This can make it difficult and painful to straighten your finger.  This condition develops when a finger tendon or tendon sheath thickens.  Treatment may include resting your finger, wearing a splint, and taking medicines.  In severe cases, surgery to open the tendon sheath may be needed. This information is not intended to replace advice given to you by your health care provider. Make sure you discuss any questions you have with your health care provider. Document Revised: 03/30/2019 Document Reviewed: 03/30/2019 Elsevier Patient Education  [place].

## 2021-02-17 NOTE — Telephone Encounter (Signed)
zofran resent per pharmacy request non ODT

## 2021-02-19 DIAGNOSIS — R202 Paresthesia of skin: Secondary | ICD-10-CM | POA: Insufficient documentation

## 2021-02-19 DIAGNOSIS — R4589 Other symptoms and signs involving emotional state: Secondary | ICD-10-CM | POA: Insufficient documentation

## 2021-02-19 DIAGNOSIS — M65331 Trigger finger, right middle finger: Secondary | ICD-10-CM | POA: Insufficient documentation

## 2021-02-19 DIAGNOSIS — R200 Anesthesia of skin: Secondary | ICD-10-CM | POA: Insufficient documentation

## 2021-02-19 DIAGNOSIS — F418 Other specified anxiety disorders: Secondary | ICD-10-CM | POA: Insufficient documentation

## 2021-02-19 NOTE — Assessment & Plan Note (Signed)
Discussed her upcoming solo trip to [place].  Empiric antibiotic treatment (not prophylactic) discussed planned and prescriptions for use if she develops bronchitis,  Diarrhea or UTI

## 2021-02-19 NOTE — Assessment & Plan Note (Signed)
All concerns addressed and reassurance given that she is extremely healthy .  Agree with plans for counselling.

## 2021-02-19 NOTE — Assessment & Plan Note (Signed)
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  Exam is normal.  Reassurance provided.

## 2021-02-19 NOTE — Assessment & Plan Note (Signed)
Early,  Not symptomatic enough for surgery or other orthopedic intervention

## 2021-03-07 ENCOUNTER — Ambulatory Visit (INDEPENDENT_AMBULATORY_CARE_PROVIDER_SITE_OTHER): Payer: Medicare Other | Admitting: Psychologist

## 2021-03-07 DIAGNOSIS — F411 Generalized anxiety disorder: Secondary | ICD-10-CM | POA: Diagnosis not present

## 2021-03-14 DIAGNOSIS — Z23 Encounter for immunization: Secondary | ICD-10-CM | POA: Diagnosis not present

## 2021-03-14 DIAGNOSIS — Z20822 Contact with and (suspected) exposure to covid-19: Secondary | ICD-10-CM | POA: Diagnosis not present

## 2021-03-20 ENCOUNTER — Ambulatory Visit (INDEPENDENT_AMBULATORY_CARE_PROVIDER_SITE_OTHER): Payer: Medicare Other | Admitting: Psychologist

## 2021-03-20 DIAGNOSIS — F411 Generalized anxiety disorder: Secondary | ICD-10-CM | POA: Diagnosis not present

## 2021-03-29 ENCOUNTER — Other Ambulatory Visit: Payer: Self-pay | Admitting: Internal Medicine

## 2021-04-10 ENCOUNTER — Ambulatory Visit (INDEPENDENT_AMBULATORY_CARE_PROVIDER_SITE_OTHER): Payer: Medicare Other | Admitting: Dermatology

## 2021-04-10 ENCOUNTER — Other Ambulatory Visit: Payer: Self-pay

## 2021-04-10 DIAGNOSIS — L578 Other skin changes due to chronic exposure to nonionizing radiation: Secondary | ICD-10-CM

## 2021-04-10 DIAGNOSIS — D1800 Hemangioma unspecified site: Secondary | ICD-10-CM | POA: Diagnosis not present

## 2021-04-10 DIAGNOSIS — L821 Other seborrheic keratosis: Secondary | ICD-10-CM

## 2021-04-10 DIAGNOSIS — D229 Melanocytic nevi, unspecified: Secondary | ICD-10-CM

## 2021-04-10 DIAGNOSIS — D692 Other nonthrombocytopenic purpura: Secondary | ICD-10-CM

## 2021-04-10 DIAGNOSIS — I6529 Occlusion and stenosis of unspecified carotid artery: Secondary | ICD-10-CM

## 2021-04-10 DIAGNOSIS — L814 Other melanin hyperpigmentation: Secondary | ICD-10-CM | POA: Diagnosis not present

## 2021-04-10 DIAGNOSIS — Z1283 Encounter for screening for malignant neoplasm of skin: Secondary | ICD-10-CM | POA: Diagnosis not present

## 2021-04-10 DIAGNOSIS — L570 Actinic keratosis: Secondary | ICD-10-CM

## 2021-04-10 NOTE — Progress Notes (Signed)
Follow-Up Visit   Subjective  Raven [name] is a 80 y.o. female who presents for the following: Annual Exam (Patient here today for tbse. Patient reports some dry skin on nose. She also states she has a spot on left cheek that has been there over 6 months. She states it has been treated in the past. ). Patient here for full body skin exam and skin cancer screening.  The following portions of the chart were reviewed this encounter and updated as appropriate:  Tobacco  Allergies  Meds  Problems  Med Hx  Surg Hx  Fam Hx      Objective  Well appearing patient in no apparent distress; mood and affect are within normal limits.  A full examination was performed including scalp, head, eyes, ears, nose, lips, neck, chest, axillae, abdomen, back, buttocks, bilateral upper extremities, bilateral lower extremities, hands, feet, fingers, toes, fingernails, and toenails. All findings within normal limits unless otherwise noted below.  Objective  face x 7 (7): Erythematous thin papules/macules with gritty scale.   Assessment & Plan  Actinic keratosis (7) face x 7 Prior to procedure, discussed risks of blister formation, small wound, skin dyspigmentation, or rare scar following cryotherapy.   Destruction of lesion - face x 7 Complexity: simple   Destruction method: cryotherapy   Informed consent: discussed and consent obtained   Timeout:  patient name, date of birth, surgical site, and procedure verified Lesion destroyed using liquid nitrogen: Yes   Region frozen until ice ball extended beyond lesion: Yes   Cryotherapy cycles:  2 Outcome: patient tolerated procedure well with no complications   Post-procedure details: wound care instructions given    Skin cancer screening   Lentigines - Scattered tan macules - Due to sun exposure - Benign-appering, observe - Recommend daily broad spectrum sunscreen SPF 30+ to sun-exposed areas, reapply every 2 hours as needed. - Call for any  changes  Seborrheic Keratoses - Stuck-on, waxy, tan-brown papules and/or plaques  - Benign-appearing - Discussed benign etiology and prognosis. - Observe - Call for any changes  Purpura - Chronic; persistent and recurrent.  Treatable, but not curable. - Violaceous macules and patches on arms  - Benign - Related to trauma, age, sun damage and/or use of blood thinners, chronic use of topical and/or oral steroids - Observe - Can use OTC arnica containing moisturizer such as Dermend Bruise Formula if desired - Call for worsening or other concerns  Melanocytic Nevi - Tan-brown and/or pink-flesh-colored symmetric macules and papules - Benign appearing on exam today - Observation - Call clinic for new or changing moles - Recommend daily use of broad spectrum spf 30+ sunscreen to sun-exposed areas.   Hemangiomas - Red papules - Discussed benign nature - Observe - Call for any changes  Actinic Damage - Chronic condition, secondary to cumulative UV/sun exposure - diffuse scaly erythematous macules with underlying dyspigmentation - Recommend daily broad spectrum sunscreen SPF 30+ to sun-exposed areas, reapply every 2 hours as needed.  - Staying in the shade or wearing long sleeves, sun glasses (UVA+UVB protection) and wide brim hats (4-inch brim around the entire circumference of the hat) are also recommended for sun protection.  - Call for new or changing lesions.  Skin cancer screening performed today.  Return in about 1 year (around 04/10/2022) for tbse.  IRuthell Rummage, CMA, am acting as scribe for Sarina Ser, MD.  Documentation: I have reviewed the above documentation for accuracy and completeness, and I agree with the above.  Sarina Ser, MD

## 2021-04-10 NOTE — Patient Instructions (Signed)
Cryotherapy Aftercare  . Wash gently with soap and water everyday.   Marland Kitchen Apply Vaseline and Band-Aid daily until healed.  Seborrheic Keratosis  What causes seborrheic keratoses? Seborrheic keratoses are harmless, common skin growths that first appear during adult life.  As time goes by, more growths appear.  Some people may develop a large number of them.  Seborrheic keratoses appear on both covered and uncovered body parts.  They are not caused by sunlight.  The tendency to develop seborrheic keratoses can be inherited.  They vary in color from skin-colored to gray, brown, or even black.  They can be either smooth or have a rough, warty surface.   Seborrheic keratoses are superficial and look as if they were stuck on the skin.  Under the microscope this type of keratosis looks like layers upon layers of skin.  That is why at times the top layer may seem to fall off, but the rest of the growth remains and re-grows.    Treatment Seborrheic keratoses do not need to be treated, but can easily be removed in the office.  Seborrheic keratoses often cause symptoms when they rub on clothing or jewelry.  Lesions can be in the way of shaving.  If they become inflamed, they can cause itching, soreness, or burning.  Removal of a seborrheic keratosis can be accomplished by freezing, burning, or surgery. If any spot bleeds, scabs, or grows rapidly, please return to have it checked, as these can be an indication of a skin cancer.  Actinic keratoses are precancerous spots that appear secondary to cumulative UV radiation exposure/sun exposure over time. They are chronic with expected duration over 1 year. A portion of actinic keratoses will progress to squamous cell carcinoma of the skin. It is not possible to reliably predict which spots will progress to skin cancer and so treatment is recommended to prevent development of skin cancer.  Recommend daily broad spectrum sunscreen SPF 30+ to sun-exposed areas, reapply  every 2 hours as needed.  Recommend staying in the shade or wearing long sleeves, sun glasses (UVA+UVB protection) and wide brim hats (4-inch brim around the entire circumference of the hat). Call for new or changing lesions.  If you have any questions or concerns for your doctor, please call our main line at [phone] and press option 4 to reach your doctor's medical assistant. If no one answers, please leave a voicemail as directed and we will return your call as soon as possible. Messages left after 4 pm will be answered the following business day.   You may also send [place] a message via [place]. We typically respond to MyChart messages within 1-2 business days.  For prescription refills, please ask your pharmacy to contact our office. Our fax number is [phone].  If you have an urgent issue when the clinic is closed that cannot wait until the next business day, you can page your doctor at the number below.    Please note that while we do our best to be available for urgent issues outside of office hours, we are not available 24/7.   If you have an urgent issue and are unable to reach [place], you may choose to seek medical care at your doctor's office, retail clinic, urgent care center, or emergency room.  If you have a medical emergency, please immediately call 911 or go to the emergency department.  Pager Numbers  - Dr. Nehemiah Massed: [phone]  - Dr. Laurence Ferrari: [phone]  - Dr. Nicole Kindred: [phone]  In the event  of inclement weather, please call our main line at [phone] for an update on the status of any delays or closures.  Dermatology Medication Tips: Please keep the boxes that topical medications come in in order to help keep track of the instructions about where and how to use these. Pharmacies typically print the medication instructions only on the boxes and not directly on the medication tubes.   If your medication is too expensive, please contact our office at  [phone] option 4 or send [place] a message through [place].   We are unable to tell what your co-pay for medications will be in advance as this is different depending on your insurance coverage. However, we may be able to find a substitute medication at lower cost or fill out paperwork to get insurance to cover a needed medication.   If a prior authorization is required to get your medication covered by your insurance company, please allow [place] 1-2 business days to complete this process.  Drug prices often vary depending on where the prescription is filled and some pharmacies may offer cheaper prices.  The website www.goodrx.com contains coupons for medications through different pharmacies. The prices here do not account for what the cost may be with help from insurance (it may be cheaper with your insurance), but the website can give you the price if you did not use any insurance.  - You can print the associated coupon and take it with your prescription to the pharmacy.  - You may also stop by our office during regular business hours and pick up a GoodRx coupon card.  - If you need your prescription sent electronically to a different pharmacy, notify our office through [facility] or by phone at [phone] option 4.

## 2021-04-18 ENCOUNTER — Ambulatory Visit (INDEPENDENT_AMBULATORY_CARE_PROVIDER_SITE_OTHER): Payer: Medicare Other | Admitting: Psychologist

## 2021-04-18 ENCOUNTER — Encounter: Payer: Self-pay | Admitting: Dermatology

## 2021-04-18 DIAGNOSIS — F411 Generalized anxiety disorder: Secondary | ICD-10-CM | POA: Diagnosis not present

## 2021-04-21 ENCOUNTER — Encounter: Payer: Self-pay | Admitting: Internal Medicine

## 2021-05-01 ENCOUNTER — Ambulatory Visit (INDEPENDENT_AMBULATORY_CARE_PROVIDER_SITE_OTHER): Payer: Medicare Other | Admitting: Psychologist

## 2021-05-01 DIAGNOSIS — F411 Generalized anxiety disorder: Secondary | ICD-10-CM

## 2021-06-09 DIAGNOSIS — Z20822 Contact with and (suspected) exposure to covid-19: Secondary | ICD-10-CM | POA: Diagnosis not present

## 2021-06-27 ENCOUNTER — Other Ambulatory Visit: Payer: Self-pay

## 2021-06-27 ENCOUNTER — Ambulatory Visit
Admission: RE | Admit: 2021-06-27 | Discharge: 2021-06-27 | Disposition: A | Discharge status: Home / Self Care | Payer: Medicare Other | Source: Ambulatory Visit | Attending: Internal Medicine | Admitting: Internal Medicine

## 2021-06-27 DIAGNOSIS — Z1231 Encounter for screening mammogram for malignant neoplasm of breast: Secondary | ICD-10-CM | POA: Diagnosis not present

## 2021-07-27 DIAGNOSIS — M25472 Effusion, left ankle: Secondary | ICD-10-CM | POA: Diagnosis not present

## 2021-08-04 ENCOUNTER — Telehealth: Payer: Self-pay | Admitting: Internal Medicine

## 2021-08-04 NOTE — Telephone Encounter (Signed)
Patient called in stating that she went to [facility] recently for swelling in her right foot and ankle and they did an x-ray and there were no broken bones in her foot.She is still having trouble and thinks it may be a vascular problem and would like to see Dr.Tullo.No openings until 9/30,please advise.

## 2021-08-07 NOTE — Telephone Encounter (Signed)
Spoke with pt and she stated that she swelling has been occurring since April it has just started to get worse or occur more often. Pt stated that there is some warmth when the swelling is worse but it goes away once swelling goes back down. There is no redness, no SOBr, no chest pain. Pt is scheduled for an appt with Dr. Derrel Nip on 08/21/2021. No appts available until then.

## 2021-08-07 NOTE — Telephone Encounter (Signed)
Pt is aware and gave a verbal understanding.  

## 2021-08-17 DIAGNOSIS — Z23 Encounter for immunization: Secondary | ICD-10-CM | POA: Diagnosis not present

## 2021-08-21 ENCOUNTER — Encounter: Payer: Self-pay | Admitting: Family

## 2021-08-21 ENCOUNTER — Ambulatory Visit (INDEPENDENT_AMBULATORY_CARE_PROVIDER_SITE_OTHER): Payer: Medicare Other | Admitting: Family

## 2021-08-21 ENCOUNTER — Ambulatory Visit: Payer: Federal, State, Local not specified - PPO | Admitting: Internal Medicine

## 2021-08-21 ENCOUNTER — Other Ambulatory Visit: Payer: Self-pay

## 2021-08-21 VITALS — BP 130/72 | HR 78 | Temp 98.5°F | Ht 62.0 in | Wt 125.1 lb

## 2021-08-21 DIAGNOSIS — M25472 Effusion, left ankle: Secondary | ICD-10-CM

## 2021-08-21 DIAGNOSIS — I6529 Occlusion and stenosis of unspecified carotid artery: Secondary | ICD-10-CM

## 2021-08-21 MED ORDER — DICLOFENAC SODIUM 1 % EX GEL: 4.0000 g | Freq: Four times a day (QID) | CUTANEOUS | 3 refills | Status: AC
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
## 2021-08-21 NOTE — Assessment & Plan Note (Addendum)
Improved today. Wells criteria low risk DVT and exam doesn't raise suspicion for DVT. Differentials include underlying left ankle arthritic changes, venous insufficiency.  Discussed conservative measures today including icing 2-3 times a day for 20 minutes, topical Voltaren gel, and provided patient with Ace wrap.  Consider second opinion orthopedics or consult with podiatry if pain doesn't resolve. We did discuss a vascular referral as well but opted to trial conservative measures first.  Follow-up in 6 weeks

## 2021-08-21 NOTE — Patient Instructions (Signed)
As discussed, please wear Ace wrap 4 to 6 hours today as it is comfortable for you.  ice your left ankle for 20 minutes 2-3 times a day, but most particularly after you participate in exercise.  Voltaren gel.  If no improvement, please let me know

## 2021-08-22 ENCOUNTER — Telehealth: Payer: Self-pay | Admitting: Internal Medicine

## 2021-08-22 NOTE — Telephone Encounter (Signed)
FYI I spoke with patient & she stated that she had taken Advil with no relief. She asked if she could try the tylenol & told her she could to see if that elevated pain better. She stated that today had been much worse than yesterday & unsure why really. She also said that her daughter is a physical therapist & she will ask who she thinks may would be a good orthopedist to see if that she the route she ends up going. Pt will let us know.

## 2021-08-22 NOTE — Telephone Encounter (Signed)
Pain in ankle thinks it tendonitis. Saw [facility] on 08/21/21. Would like a call back from provider.

## 2021-08-22 NOTE — Telephone Encounter (Signed)
Not really sure what to advise patient on this?

## 2021-08-23 DIAGNOSIS — M25472 Effusion, left ankle: Secondary | ICD-10-CM | POA: Diagnosis not present

## 2021-08-23 NOTE — Telephone Encounter (Signed)
I called patient & she was doing well today. She has been icing & taking Advil/tylenol alternating. She was able to get a referral for PT & will start next week with [facility]. If she feels a referral is needed she will call to let us know. She appreciated the follow-up call .

## 2021-08-23 NOTE — Telephone Encounter (Signed)
Patient How she doing today? Has she been diligent with icing? Using the voltaren gel?    If she still experiencing such pain, I would advise second opinion with orthopedics or podiatry.  Please let me know if I can place referral

## 2021-08-25 DIAGNOSIS — Z961 Presence of intraocular lens: Secondary | ICD-10-CM | POA: Diagnosis not present

## 2021-08-28 ENCOUNTER — Ambulatory Visit: Payer: Medicare Other | Attending: Physician Assistant

## 2021-08-28 DIAGNOSIS — R600 Localized edema: Secondary | ICD-10-CM | POA: Diagnosis not present

## 2021-08-28 DIAGNOSIS — M25672 Stiffness of left ankle, not elsewhere classified: Secondary | ICD-10-CM | POA: Diagnosis not present
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
## 2021-08-31 ENCOUNTER — Ambulatory Visit: Payer: Medicare Other

## 2021-08-31 DIAGNOSIS — R600 Localized edema: Secondary | ICD-10-CM

## 2021-08-31 DIAGNOSIS — M25672 Stiffness of left ankle, not elsewhere classified: Secondary | ICD-10-CM
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
## 2021-09-04 ENCOUNTER — Ambulatory Visit: Payer: Medicare Other

## 2021-09-04 DIAGNOSIS — M25672 Stiffness of left ankle, not elsewhere classified: Secondary | ICD-10-CM

## 2021-09-04 DIAGNOSIS — R600 Localized edema: Secondary | ICD-10-CM
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
## 2021-09-06 ENCOUNTER — Ambulatory Visit: Payer: Medicare Other | Admitting: Physical Therapy

## 2021-09-07 ENCOUNTER — Ambulatory Visit: Payer: Medicare Other

## 2021-09-07 DIAGNOSIS — R600 Localized edema: Secondary | ICD-10-CM | POA: Diagnosis not present

## 2021-09-07 DIAGNOSIS — Z23 Encounter for immunization: Secondary | ICD-10-CM | POA: Diagnosis not present

## 2021-09-07 DIAGNOSIS — M25672 Stiffness of left ankle, not elsewhere classified: Secondary | ICD-10-CM
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
## 2021-09-11 ENCOUNTER — Ambulatory Visit: Payer: Medicare Other | Admitting: Physical Therapy

## 2021-09-11 DIAGNOSIS — M25672 Stiffness of left ankle, not elsewhere classified: Secondary | ICD-10-CM

## 2021-09-11 DIAGNOSIS — R600 Localized edema: Secondary | ICD-10-CM | POA: Diagnosis not present
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
## 2021-09-14 ENCOUNTER — Encounter: Payer: Self-pay | Admitting: Physical Therapy

## 2021-09-14 ENCOUNTER — Ambulatory Visit: Payer: Medicare Other | Admitting: Physical Therapy

## 2021-09-14 DIAGNOSIS — R600 Localized edema: Secondary | ICD-10-CM

## 2021-09-14 DIAGNOSIS — M25672 Stiffness of left ankle, not elsewhere classified: Secondary | ICD-10-CM
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
## 2021-09-18 ENCOUNTER — Ambulatory Visit: Payer: Medicare Other | Admitting: Physical Therapy

## 2021-09-18 ENCOUNTER — Other Ambulatory Visit: Payer: Self-pay

## 2021-09-18 DIAGNOSIS — R600 Localized edema: Secondary | ICD-10-CM

## 2021-09-18 DIAGNOSIS — M25672 Stiffness of left ankle, not elsewhere classified: Secondary | ICD-10-CM
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
## 2021-09-20 ENCOUNTER — Ambulatory Visit: Payer: Medicare Other | Admitting: Physical Therapy

## 2021-09-20 DIAGNOSIS — R600 Localized edema: Secondary | ICD-10-CM | POA: Diagnosis not present

## 2021-09-20 DIAGNOSIS — M25672 Stiffness of left ankle, not elsewhere classified: Secondary | ICD-10-CM | POA: Diagnosis not present
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
## 2021-09-25 ENCOUNTER — Ambulatory Visit: Payer: Medicare Other | Admitting: Physical Therapy

## 2021-09-25 DIAGNOSIS — R600 Localized edema: Secondary | ICD-10-CM | POA: Diagnosis not present

## 2021-09-25 DIAGNOSIS — M25672 Stiffness of left ankle, not elsewhere classified: Secondary | ICD-10-CM
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
## 2021-09-28 ENCOUNTER — Ambulatory Visit: Payer: Medicare Other | Attending: Physician Assistant | Admitting: Physical Therapy

## 2021-09-28 ENCOUNTER — Encounter: Payer: Self-pay | Admitting: Physical Therapy

## 2021-09-28 DIAGNOSIS — M25672 Stiffness of left ankle, not elsewhere classified: Secondary | ICD-10-CM | POA: Diagnosis not present

## 2021-09-28 DIAGNOSIS — R600 Localized edema: Secondary | ICD-10-CM | POA: Diagnosis not present
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
## 2021-10-02 ENCOUNTER — Encounter: Payer: Federal, State, Local not specified - PPO | Admitting: Physical Therapy

## 2021-10-05 ENCOUNTER — Encounter: Payer: Federal, State, Local not specified - PPO | Admitting: Physical Therapy

## 2021-10-09 ENCOUNTER — Ambulatory Visit: Payer: Federal, State, Local not specified - PPO | Admitting: Internal Medicine

## 2021-10-17 ENCOUNTER — Other Ambulatory Visit: Payer: Self-pay | Admitting: Internal Medicine

## 2021-10-31 DIAGNOSIS — Z20822 Contact with and (suspected) exposure to covid-19: Secondary | ICD-10-CM | POA: Diagnosis not present

## 2022-01-05 ENCOUNTER — Telehealth: Payer: Self-pay | Admitting: Internal Medicine

## 2022-01-05 DIAGNOSIS — R5383 Other fatigue: Secondary | ICD-10-CM

## 2022-01-05 DIAGNOSIS — E782 Mixed hyperlipidemia: Secondary | ICD-10-CM

## 2022-01-05 DIAGNOSIS — R7301 Impaired fasting glucose: Secondary | ICD-10-CM

## 2022-01-05 DIAGNOSIS — E034 Atrophy of thyroid (acquired): Secondary | ICD-10-CM

## 2022-01-05 NOTE — Telephone Encounter (Signed)
Advised pt that she has a refill at the pharmacy and to give them a call to refill. Also scheduled pt for a fasting lab appt before physical in March. Labs have been ordered.   I have ordered A1c, CBC, TSH, CMP, and lipid panel. Is there anything else that needs to be ordered?

## 2022-01-05 NOTE — Telephone Encounter (Signed)
Patient calling in medication levothyroxine 75 MG  is due to refilled  in may (Patient is not sure have enough pills to lat to may.Please call patient has questions [phone]

## 2022-01-15 ENCOUNTER — Ambulatory Visit (INDEPENDENT_AMBULATORY_CARE_PROVIDER_SITE_OTHER): Payer: Medicare Other

## 2022-01-15 VITALS — Ht 62.0 in | Wt 125.0 lb

## 2022-01-15 DIAGNOSIS — Z0000 Encounter for general adult medical examination without abnormal findings: Secondary | ICD-10-CM | POA: Diagnosis not present
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
## 2022-01-15 NOTE — Patient Instructions (Addendum)
[name] , Thank you for taking time to come for your Medicare Wellness Visit. I appreciate your ongoing commitment to your health goals. Please review the following plan we discussed and let me know if I can assist you in the future.   These are the goals we discussed:  Goals       Patient Stated     Increase physical activity (pt-stated)      Add more walking for cardio exercise        This is a list of the screening recommended for you and due dates:  Health Maintenance  Topic Date Due   Mammogram  06/27/2022   Tetanus Vaccine  06/01/2024   Pneumonia Vaccine  Completed   Flu Shot  Completed   DEXA scan (bone density measurement)  Completed   COVID-19 Vaccine  Completed   Hepatitis C Screening: USPSTF Recommendation to screen - Ages 18-79 yo.  Completed   Zoster (Shingles) Vaccine  Completed   HPV Vaccine  Aged Out    Advanced directives: End of life planning; Advance aging; Advanced directives discussed.  Copy of current HCPOA/Living Will requested.    Conditions/risks identified: none new  Follow up in one year for your annual wellness visit    Preventive Care 65 Years and Older, Female Preventive care refers to lifestyle choices and visits with your health care provider that can promote health and wellness. What does preventive care include? A yearly physical exam. This is also called an annual well check. Dental exams once or twice a year. Routine eye exams. Ask your health care provider how often you should have your eyes checked. Personal lifestyle choices, including: Daily care of your teeth and gums. Regular physical activity. Eating a healthy diet. Avoiding tobacco and drug use. Limiting alcohol use. Practicing safe sex. Taking low-dose aspirin every day. Taking vitamin and mineral supplements as recommended by your health care provider. What happens during an annual well check? The services and screenings done by your health care provider during your  annual well check will depend on your age, overall health, lifestyle risk factors, and family history of disease. Counseling  Your health care provider may ask you questions about your: Alcohol use. Tobacco use. Drug use. Emotional well-being. Home and relationship well-being. Sexual activity. Eating habits. History of falls. Memory and ability to understand (cognition). Work and work Statistician. Reproductive health. Screening  You may have the following tests or measurements: Height, weight, and BMI. Blood pressure. Lipid and cholesterol levels. These may be checked every 5 years, or more frequently if you are over 17 years old. Skin check. Lung cancer screening. You may have this screening every year starting at age 75 if you have a 30-pack-year history of smoking and currently smoke or have quit within the past 15 years. Fecal occult blood test (FOBT) of the stool. You may have this test every year starting at age 16. Flexible sigmoidoscopy or colonoscopy. You may have a sigmoidoscopy every 5 years or a colonoscopy every 10 years starting at age 53. Hepatitis C blood test. Hepatitis B blood test. Sexually transmitted disease (STD) testing. Diabetes screening. This is done by checking your blood sugar (glucose) after you have not eaten for a while (fasting). You may have this done every 1-3 years. Bone density scan. This is done to screen for osteoporosis. You may have this done starting at age 23. Mammogram. This may be done every 1-2 years. Talk to your health care provider about how often you  should have regular mammograms. Talk with your health care provider about your test results, treatment options, and if necessary, the need for more tests. Vaccines  Your health care provider may recommend certain vaccines, such as: Influenza vaccine. This is recommended every year. Tetanus, diphtheria, and acellular pertussis (Tdap, Td) vaccine. You may need a Td booster every 10  years. Zoster vaccine. You may need this after age 71. Pneumococcal 13-valent conjugate (PCV13) vaccine. One dose is recommended after age 35. Pneumococcal polysaccharide (PPSV23) vaccine. One dose is recommended after age 31. Talk to your health care provider about which screenings and vaccines you need and how often you need them. This information is not intended to replace advice given to you by your health care provider. Make sure you discuss any questions you have with your health care provider. Document Released: 12/09/2015 Document Revised: 08/01/2016 Document Reviewed: 09/13/2015 Elsevier Interactive Patient Education  2017 [place] Prevention in the Home Falls can cause injuries. They can happen to people of all ages. There are many things you can do to make your home safe and to help prevent falls. What can I do on the outside of my home? Regularly fix the edges of walkways and driveways and fix any cracks. Remove anything that might make you trip as you walk through a door, such as a raised step or threshold. Trim any bushes or trees on the path to your home. Use bright outdoor lighting. Clear any walking paths of anything that might make someone trip, such as rocks or tools. Regularly check to see if handrails are loose or broken. Make sure that both sides of any steps have handrails. Any raised decks and porches should have guardrails on the edges. Have any leaves, snow, or ice cleared regularly. Use sand or salt on walking paths during winter. Clean up any spills in your garage right away. This includes oil or grease spills. What can I do in the bathroom? Use night lights. Install grab bars by the toilet and in the tub and shower. Do not use towel bars as grab bars. Use non-skid mats or decals in the tub or shower. If you need to sit down in the shower, use a plastic, non-slip stool. Keep the floor dry. Clean up any water that spills on the floor as soon as it  happens. Remove soap buildup in the tub or shower regularly. Attach bath mats securely with double-sided non-slip rug tape. Do not have throw rugs and other things on the floor that can make you trip. What can I do in the bedroom? Use night lights. Make sure that you have a light by your bed that is easy to reach. Do not use any sheets or blankets that are too big for your bed. They should not hang down onto the floor. Have a firm chair that has side arms. You can use this for support while you get dressed. Do not have throw rugs and other things on the floor that can make you trip. What can I do in the kitchen? Clean up any spills right away. Avoid walking on wet floors. Keep items that you use a lot in easy-to-reach places. If you need to reach something above you, use a strong step stool that has a grab bar. Keep electrical cords out of the way. Do not use floor polish or wax that makes floors slippery. If you must use wax, use non-skid floor wax. Do not have throw rugs and other things on the  floor that can make you trip. What can I do with my stairs? Do not leave any items on the stairs. Make sure that there are handrails on both sides of the stairs and use them. Fix handrails that are broken or loose. Make sure that handrails are as long as the stairways. Check any carpeting to make sure that it is firmly attached to the stairs. Fix any carpet that is loose or worn. Avoid having throw rugs at the top or bottom of the stairs. If you do have throw rugs, attach them to the floor with carpet tape. Make sure that you have a light switch at the top of the stairs and the bottom of the stairs. If you do not have them, ask someone to add them for you. What else can I do to help prevent falls? Wear shoes that: Do not have high heels. Have rubber bottoms. Are comfortable and fit you well. Are closed at the toe. Do not wear sandals. If you use a stepladder: Make sure that it is fully opened.  Do not climb a closed stepladder. Make sure that both sides of the stepladder are locked into place. Ask someone to hold it for you, if possible. Clearly mark and make sure that you can see: Any grab bars or handrails. First and last steps. Where the edge of each step is. Use tools that help you move around (mobility aids) if they are needed. These include: Canes. Walkers. Scooters. Crutches. Turn on the lights when you go into a dark area. Replace any light bulbs as soon as they burn out. Set up your furniture so you have a clear path. Avoid moving your furniture around. If any of your floors are uneven, fix them. If there are any pets around you, be aware of where they are. Review your medicines with your doctor. Some medicines can make you feel dizzy. This can increase your chance of falling. Ask your doctor what other things that you can do to help prevent falls. This information is not intended to replace advice given to you by your health care provider. Make sure you discuss any questions you have with your health care provider. Document Released: 09/08/2009 Document Revised: 04/19/2016 Document Reviewed: 12/17/2014 Elsevier Interactive Patient Education  2017 [employer].

## 2022-01-24 DIAGNOSIS — Z20822 Contact with and (suspected) exposure to covid-19: Secondary | ICD-10-CM | POA: Diagnosis not present

## 2022-02-07 DIAGNOSIS — Z20822 Contact with and (suspected) exposure to covid-19: Secondary | ICD-10-CM | POA: Diagnosis not present

## 2022-02-15 ENCOUNTER — Other Ambulatory Visit: Payer: Federal, State, Local not specified - PPO

## 2022-02-16 ENCOUNTER — Other Ambulatory Visit: Payer: Self-pay

## 2022-02-16 ENCOUNTER — Other Ambulatory Visit (INDEPENDENT_AMBULATORY_CARE_PROVIDER_SITE_OTHER): Payer: Medicare Other

## 2022-02-16 DIAGNOSIS — E034 Atrophy of thyroid (acquired): Secondary | ICD-10-CM | POA: Diagnosis not present

## 2022-02-16 DIAGNOSIS — R5383 Other fatigue: Secondary | ICD-10-CM | POA: Diagnosis not present

## 2022-02-16 DIAGNOSIS — E782 Mixed hyperlipidemia: Secondary | ICD-10-CM

## 2022-02-16 DIAGNOSIS — R7301 Impaired fasting glucose: Secondary | ICD-10-CM | POA: Diagnosis not present

## 2022-02-16 DIAGNOSIS — Z20822 Contact with and (suspected) exposure to covid-19: Secondary | ICD-10-CM | POA: Diagnosis not present

## 2022-02-16 LAB — LIPID PANEL
Cholesterol: 191 mg/dL (ref 0–200)
HDL: 82.8 mg/dL (ref >39.00)
LDL Cholesterol: 97 mg/dL (ref 0–99)
NonHDL: 107.72
Total CHOL/HDL Ratio: 2
Triglycerides: 54 mg/dL (ref 0.0–149.0)
VLDL: 10.8 mg/dL (ref 0.0–40.0)

## 2022-02-16 LAB — CBC WITH DIFFERENTIAL/PLATELET
Basophils Absolute: 0 10*3/uL (ref 0.0–0.1)
Basophils Relative: 0.9 % (ref 0.0–3.0)
Eosinophils Absolute: 0.1 10*3/uL (ref 0.0–0.7)
Eosinophils Relative: 1.6 % (ref 0.0–5.0)
HCT: 36.8 % (ref 36.0–46.0)
Hemoglobin: 12.4 g/dL (ref 12.0–15.0)
Lymphocytes Relative: 34.4 % (ref 12.0–46.0)
Lymphs Abs: 1.6 10*3/uL (ref 0.7–4.0)
MCHC: 33.8 g/dL (ref 30.0–36.0)
MCV: 94.8 fl (ref 78.0–100.0)
Monocytes Absolute: 0.4 10*3/uL (ref 0.1–1.0)
Monocytes Relative: 8.4 % (ref 3.0–12.0)
Neutro Abs: 2.5 10*3/uL (ref 1.4–7.7)
Neutrophils Relative %: 54.7 % (ref 43.0–77.0)
Platelets: 214 10*3/uL (ref 150.0–400.0)
RBC: 3.89 Mil/uL (ref 3.87–5.11)
RDW: 13.3 % (ref 11.5–15.5)
WBC: 4.6 10*3/uL (ref 4.0–10.5)

## 2022-02-16 LAB — COMPREHENSIVE METABOLIC PANEL
ALT: 15 U/L (ref 0–35)
AST: 23 U/L (ref 0–37)
Albumin: 4.3 g/dL (ref 3.5–5.2)
Alkaline Phosphatase: 45 U/L (ref 39–117)
BUN: 19 mg/dL (ref 6–23)
CO2: 28 mEq/L (ref 19–32)
Calcium: 9.3 mg/dL (ref 8.4–10.5)
Chloride: 105 mEq/L (ref 96–112)
Creatinine, Ser: 0.78 mg/dL (ref 0.40–1.20)
GFR: 72.5 mL/min (ref >60.00)
Glucose, Bld: 91 mg/dL (ref 70–99)
Potassium: 3.9 mEq/L (ref 3.5–5.1)
Sodium: 140 mEq/L (ref 135–145)
Total Bilirubin: 0.6 mg/dL (ref 0.2–1.2)
Total Protein: 6.8 g/dL (ref 6.0–8.3)

## 2022-02-16 LAB — HEMOGLOBIN A1C: Hgb A1c MFr Bld: 5.7 % (ref 4.6–6.5)

## 2022-02-19 ENCOUNTER — Other Ambulatory Visit: Payer: Self-pay

## 2022-02-19 ENCOUNTER — Ambulatory Visit (INDEPENDENT_AMBULATORY_CARE_PROVIDER_SITE_OTHER): Payer: Medicare Other | Admitting: Internal Medicine

## 2022-02-19 ENCOUNTER — Encounter: Payer: Self-pay | Admitting: Internal Medicine

## 2022-02-19 ENCOUNTER — Telehealth: Payer: Self-pay | Admitting: Internal Medicine

## 2022-02-19 VITALS — BP 124/84 | HR 70 | Temp 98.0°F | Ht 62.0 in | Wt 124.6 lb

## 2022-02-19 DIAGNOSIS — F4521 Hypochondriasis: Secondary | ICD-10-CM | POA: Diagnosis not present

## 2022-02-19 DIAGNOSIS — N6323 Unspecified lump in the left breast, lower outer quadrant: Secondary | ICD-10-CM | POA: Diagnosis not present

## 2022-02-19 DIAGNOSIS — E034 Atrophy of thyroid (acquired): Secondary | ICD-10-CM

## 2022-02-19 DIAGNOSIS — Z789 Other specified health status: Secondary | ICD-10-CM

## 2022-02-19 DIAGNOSIS — D692 Other nonthrombocytopenic purpura: Secondary | ICD-10-CM | POA: Diagnosis not present

## 2022-02-19 DIAGNOSIS — Z7184 Encounter for health counseling related to travel: Secondary | ICD-10-CM | POA: Diagnosis not present

## 2022-02-19 DIAGNOSIS — E210 Primary hyperparathyroidism: Secondary | ICD-10-CM

## 2022-02-19 DIAGNOSIS — E782 Mixed hyperlipidemia: Secondary | ICD-10-CM

## 2022-02-19 DIAGNOSIS — Z1231 Encounter for screening mammogram for malignant neoplasm of breast: Secondary | ICD-10-CM | POA: Diagnosis not present

## 2022-02-19 DIAGNOSIS — H269 Unspecified cataract: Secondary | ICD-10-CM | POA: Diagnosis not present

## 2022-02-19 DIAGNOSIS — R5383 Other fatigue: Secondary | ICD-10-CM | POA: Diagnosis not present

## 2022-02-19 DIAGNOSIS — R7301 Impaired fasting glucose: Secondary | ICD-10-CM

## 2022-02-19 NOTE — Patient Instructions (Signed)
Enjoy your trip! ? ?Discuss the MOST form with your daughter at some point ? ?Mammogram of left breast ordered  ? ? ? ? ?

## 2022-02-19 NOTE — Assessment & Plan Note (Signed)
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 LDL  goal of < 100  .   ?

## 2022-02-19 NOTE — Assessment & Plan Note (Signed)
Reviewed DNR and MOST order forms but not signed ?

## 2022-02-19 NOTE — Telephone Encounter (Signed)
Lft pt vm to call ofc regarding to give number to [place]. thanks ?

## 2022-02-19 NOTE — Progress Notes (Signed)
Patient ID: Raven [name], female    DOB: [birth year]  Age: 81 y.o. MRN: [id number] ? ?The patient is here for follow up and  management of other chronic and acute problems. ? ?This visit occurred during the SARS-CoV-2 public health emergency.  Safety protocols were in place, including screening questions prior to the visit, additional usage of staff PPE, and extensive cleaning of exam room while observing appropriate contact time as indicated for disinfecting solutions.  ? ?  ?The risk factors are reflected in the social history. ? ?The roster of all physicians providing medical care to patient - is listed in the Snapshot section of the chart. ? ?Activities of daily living:  The patient is 100% independent in all ADLs: dressing, toileting, feeding as well as independent mobility ? ?Home safety : The patient has smoke detectors in the home. They wear seatbelts.  There are no firearms at home. There is no violence in the home.  ? ?There is no risks for hepatitis, STDs or HIV. There is no   history of blood transfusion. They have no travel history to infectious disease endemic areas of the world. ? ?The patient has seen their dentist in the last six month. They have seen their eye doctor in the last year. They admit to slight hearing difficulty with regard to whispered voices and some television programs.  They have deferred audiologic testing in the last year.  They do not  have excessive sun exposure. Discussed the need for sun protection: hats, long sleeves and use of sunscreen if there is significant sun exposure.  ? ?Diet: the importance of a healthy diet is discussed. They do have a healthy diet. ? ?The benefits of regular aerobic exercise were discussed. She walks 4 times per week ,  20 minutes.  ? ?Depression screen: there are no signs or vegative symptoms of depression- irritability, change in appetite, anhedonia, sadness/tearfullness. ? ?Cognitive assessment: the patient manages all their financial and personal  affairs and is actively engaged. They could relate day,date,year and events; recalled 2/3 objects at 3 minutes; performed clock-face test normally. ? ?The following portions of the patient's history were reviewed and updated as appropriate: allergies, current medications, past family history, past medical history,  past surgical history, past social history  and problem list. ? ?Visual acuity was not assessed per patient preference since she has regular follow up with her ophthalmologist. Hearing and body mass index were assessed and reviewed.  ? ?During the course of the visit the patient was educated and counseled about appropriate screening and preventive services including : fall prevention , diabetes screening, nutrition counseling, colorectal cancer screening, and recommended immunizations.   ? ?CC: The primary encounter diagnosis was Hypothyroidism due to acquired atrophy of thyroid. 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 ? ? ?1) Left ankle pain:  has seen podiatry.  No fractures.  Receiving PT ? ?2) hypothyroid: has been skippig Sunday's dose since last TSH per my instructions.   ? ?3) concern about cognitive decline: reviewed recent comprehensive neurologic evaluation , which was normal  ? ?History ?Clevie has a past medical history of Actinic keratoses, Arthritis, Carotid artery occlusion, Fracture (Sep 11 2008), Headache(784.0), Hyperlipidemia, Hypothyroidism, Idiopathic  parathyroidism ([place]), Primary hyperparathyroidism ([place]), and Thyroid disease.  ? ?She has a past surgical history that includes Carpal tunnel release  (2000); Cesarean section; Rotator cuff repair (2013); Parathyroidectomy (Left, 03/23/2014); Cataract extraction w/PHACO (Right, 12/06/2020); and Cataract extraction w/PHACO (Left, 01/03/2021).  ? ?Her family history includes Alzheimer's disease in her mother; Arthritis in an other family member; Dementia in her maternal grandmother and mother; Heart attack in her father and mother; Heart disease in her father; Heart failure in her father.She reports that she has never smoked. She has never used smokeless tobacco. She reports current alcohol use of about 7.0 standard drinks per week. She reports that she does not use drugs. ? ?Outpatient Medications Prior to Visit  ?Medication Sig Dispense Refill  ? aspirin-acetaminophen-caffeine (EXCEDRIN MIGRAINE) 270-623-76 MG per tablet Take 1 tablet by mouth every 6 (six) hours as needed for headache.    ? Biotin 2500 MCG CAPS Take by mouth.    ? calcium-vitamin D (OSCAL WITH D) 500-200 MG-UNIT per tablet Take 1 tablet by mouth daily with breakfast.    ? ibuprofen (ADVIL,MOTRIN) 200 MG tablet Take 200-400 mg by mouth every 6 (six) hours as needed for mild pain or moderate pain.    ? levothyroxine (SYNTHROID) 75 MCG tablet TAKE 1 TAB BY MOUTH ONCE DAILY. TAKE ON AN EMPTY STOMACH WITH A GLASS OF WATER ATLEAST 30-60 MINUTES BEFORE BREAKFAST 90 tablet 1  ? Multiple Vitamin (MULTIVITAMIN WITH MINERALS) TABS tablet Take 1 tablet by mouth daily.    ? simvastatin (ZOCOR) 40 MG tablet TAKE 1 TABLET BY MOUTH AT BEDTIME 90 tablet 1  ? diclofenac Sodium (VOLTAREN) 1 % GEL Apply 4 g topically 4 (four) times daily. (Patient not taking: Reported on 2/83/1517) 50 g 3  ? folic acid (FOLVITE) 616 MCG tablet Take 400 mcg by mouth daily. (Patient not taking: Reported on 02/19/2022)    ? ondansetron (ZOFRAN) 4 MG tablet Take 1 tablet (4 mg total) by mouth every 8 (eight) hours as needed for nausea or vomiting. (Patient not taking: Reported on 02/19/2022) 20 tablet 0  ? ?No facility-administered  medications prior to visit.  ? ? ?Review of Systems ? ?Patient denies headache, fevers, malaise, unintentional weight loss, skin rash, eye pain, sinus congestion and sinus pain, sore throat, dysphagia,  hemoptysis , cough, dyspnea, wheezing, chest pain, palpitations, orthopnea, edema, abdominal pain, nausea, melena, diarrhea, constipation, flank pain, dysuria, hematuria, urinary  Frequency, nocturia, numbness, tingling, seizures,  Focal weakness, Loss of consciousness,  Tremor, insomnia, depression, anxiety, and suicidal ideation.   ? ?Objective:  ?BP 124/84 (BP Location: Left Arm, Patient Position: Sitting, Cuff Size: Small)   Pulse 70   Temp 98 ?F (36.7 ?C) (Oral)   Ht '5\' 2"'$  (1.575 m)   Wt 124 lb 9.6 oz (56.5 kg)   SpO2 97%   BMI 22.79 kg/m?  ? ?Physical Exam ? ?General appearance: alert, cooperative and appears stated age ?Head: Normocephalic, without obvious abnormality, atraumatic ?Eyes: conjunctivae/corneas clear. PERRL, EOM's intact. Fundi benign. ?Ears: normal TM's and external ear canals both ears ?Nose: Nares normal. Septum midline. Mucosa normal. No drainage or sinus tenderness. ?Throat: lips, mucosa, and tongue normal; teeth and gums normal ?Neck: no adenopathy, no carotid bruit, no JVD, supple, symmetrical, trachea midline and thyroid not enlarged, symmetric, no tenderness/mass/nodules ?Lungs: clear to auscultation bilaterally ?Breasts: normal appearance, no masses or tenderness ?Heart: regular rate and rhythm, S1, S2 normal, no murmur, click, rub or gallop ?Abdomen: soft, non-tender; bowel sounds normal; no masses,  no organomegaly ?Extremities: extremities normal, atraumatic, no cyanosis or edema ?Pulses: 2+ and symmetric ?Skin: Skin color, texture, turgor normal. No rashes or lesions ?Neurologic: Alert and oriented X 3, normal strength and tone. Normal symmetric reflexes. Normal coordination and gait.     ? ?Assessment & Plan:  ? ?Problem List Items Addressed This Visit   ? ? Travel advice  encounter  ?  She is travelling to [place] and [place] with her daughter .  She has the medications given to her last year for her trip to [place]  ?  ?  ? Patient has active power of attorney for health care  ?  Reviewed DNR a

## 2022-02-19 NOTE — Assessment & Plan Note (Signed)
Discussed her recent workup for cognitive changes, which was negative and reassuring to patient  ?

## 2022-02-19 NOTE — Assessment & Plan Note (Signed)
Thyroid function is WNL on current dose. She has been skipping Sunday's dose to avoid wasting medication.  Repeat level is needed. .  ? ?Lab Results  ?Component Value Date  ? TSH 1.63 02/17/2021  ? ? ?

## 2022-02-19 NOTE — Assessment & Plan Note (Addendum)
She is travelling to [place] and [place] with her daughter .  She has the medications given to her last year for her trip to [place]  ?

## 2022-02-20 ENCOUNTER — Other Ambulatory Visit: Payer: Self-pay | Admitting: Internal Medicine

## 2022-02-20 DIAGNOSIS — N6323 Unspecified lump in the left breast, lower outer quadrant: Secondary | ICD-10-CM

## 2022-02-20 LAB — TSH: TSH: 1.09 u[IU]/mL (ref 0.35–5.50)

## 2022-02-21 ENCOUNTER — Ambulatory Visit: Payer: Federal, State, Local not specified - PPO | Admitting: Internal Medicine

## 2022-03-02 DIAGNOSIS — Z20828 Contact with and (suspected) exposure to other viral communicable diseases: Secondary | ICD-10-CM | POA: Diagnosis not present

## 2022-03-10 DIAGNOSIS — Z20822 Contact with and (suspected) exposure to covid-19: Secondary | ICD-10-CM | POA: Diagnosis not present

## 2022-03-20 ENCOUNTER — Other Ambulatory Visit: Payer: Federal, State, Local not specified - PPO

## 2022-03-20 ENCOUNTER — Inpatient Hospital Stay: Admission: RE | Admit: 2022-03-20 | Payer: Federal, State, Local not specified - PPO | Source: Ambulatory Visit

## 2022-03-20 DIAGNOSIS — Z20822 Contact with and (suspected) exposure to covid-19: Secondary | ICD-10-CM | POA: Diagnosis not present

## 2022-03-29 DIAGNOSIS — Z20822 Contact with and (suspected) exposure to covid-19: Secondary | ICD-10-CM | POA: Diagnosis not present

## 2022-04-02 ENCOUNTER — Ambulatory Visit
Admission: RE | Admit: 2022-04-02 | Discharge: 2022-04-02 | Disposition: A | Discharge status: Home / Self Care | Payer: Medicare Other | Source: Ambulatory Visit | Attending: Internal Medicine | Admitting: Internal Medicine

## 2022-04-02 DIAGNOSIS — N6324 Unspecified lump in the left breast, lower inner quadrant: Secondary | ICD-10-CM | POA: Diagnosis not present

## 2022-04-02 DIAGNOSIS — N6323 Unspecified lump in the left breast, lower outer quadrant: Secondary | ICD-10-CM | POA: Diagnosis not present

## 2022-04-02 DIAGNOSIS — R922 Inconclusive mammogram: Secondary | ICD-10-CM | POA: Diagnosis not present

## 2022-04-13 ENCOUNTER — Other Ambulatory Visit: Payer: Self-pay | Admitting: Internal Medicine

## 2022-04-16 ENCOUNTER — Ambulatory Visit (INDEPENDENT_AMBULATORY_CARE_PROVIDER_SITE_OTHER): Payer: Medicare Other | Admitting: Dermatology

## 2022-04-16 DIAGNOSIS — Z1283 Encounter for screening for malignant neoplasm of skin: Secondary | ICD-10-CM

## 2022-04-16 DIAGNOSIS — L821 Other seborrheic keratosis: Secondary | ICD-10-CM | POA: Diagnosis not present

## 2022-04-16 DIAGNOSIS — L570 Actinic keratosis: Secondary | ICD-10-CM | POA: Diagnosis not present

## 2022-04-16 DIAGNOSIS — L820 Inflamed seborrheic keratosis: Secondary | ICD-10-CM

## 2022-04-16 DIAGNOSIS — D229 Melanocytic nevi, unspecified: Secondary | ICD-10-CM

## 2022-04-16 DIAGNOSIS — L719 Rosacea, unspecified: Secondary | ICD-10-CM | POA: Diagnosis not present

## 2022-04-16 DIAGNOSIS — D1800 Hemangioma unspecified site: Secondary | ICD-10-CM

## 2022-04-16 DIAGNOSIS — L814 Other melanin hyperpigmentation: Secondary | ICD-10-CM

## 2022-04-16 DIAGNOSIS — L578 Other skin changes due to chronic exposure to nonionizing radiation: Secondary | ICD-10-CM | POA: Diagnosis not present

## 2022-04-16 NOTE — Progress Notes (Signed)
Follow-Up Visit   Subjective  Raven [name] is a 81 y.o. female who presents for the following: Annual Exam (History of AK - Rough spot on left knee - The patient presents for Total-Body Skin Exam (TBSE) for skin cancer screening and mole check.  The patient has spots, moles and lesions to be evaluated, some may be new or changing and the patient has concerns that these could be cancer./).  The following portions of the chart were reviewed this encounter and updated as appropriate:   Tobacco  Allergies  Meds  Problems  Med Hx  Surg Hx  Fam Hx     Review of Systems:  No other skin or systemic complaints except as noted in HPI or Assessment and Plan.  Objective  Well appearing patient in no apparent distress; mood and affect are within normal limits.  A full examination was performed including scalp, head, eyes, ears, nose, lips, neck, chest, axillae, abdomen, back, buttocks, bilateral upper extremities, bilateral lower extremities, hands, feet, fingers, toes, fingernails, and toenails. All findings within normal limits unless otherwise noted below.  Left Lower Leg - Anterior Erythematous stuck-on, waxy papule or plaque  Head - Anterior (Face) Mild pinkness  Right cheek (3) Erythematous thin papules/macules with gritty scale.    Assessment & Plan   Lentigines - Scattered tan macules - Due to sun exposure - Benign-appearing, observe - Recommend daily broad spectrum sunscreen SPF 30+ to sun-exposed areas, reapply every 2 hours as needed. - Call for any changes  Seborrheic Keratoses - Stuck-on, waxy, tan-brown papules and/or plaques  - Benign-appearing - Discussed benign etiology and prognosis. - Observe - Call for any changes  Melanocytic Nevi - Tan-brown and/or pink-flesh-colored symmetric macules and papules - Benign appearing on exam today - Observation - Call clinic for new or changing moles - Recommend daily use of broad spectrum spf 30+ sunscreen to  sun-exposed areas.   Hemangiomas - Red papules - Discussed benign nature - Observe - Call for any changes  Actinic Damage - Chronic condition, secondary to cumulative UV/sun exposure - diffuse scaly erythematous macules with underlying dyspigmentation - Recommend daily broad spectrum sunscreen SPF 30+ to sun-exposed areas, reapply every 2 hours as needed.  - Staying in the shade or wearing long sleeves, sun glasses (UVA+UVB protection) and wide brim hats (4-inch brim around the entire circumference of the hat) are also recommended for sun protection.  - Call for new or changing lesions.  Skin cancer screening performed today.  Inflamed seborrheic keratosis Left Lower Leg - Anterior  Destruction of lesion - Left Lower Leg - Anterior Complexity: simple   Destruction method: cryotherapy   Informed consent: discussed and consent obtained   Timeout:  patient name, date of birth, surgical site, and procedure verified Lesion destroyed using liquid nitrogen: Yes   Region frozen until ice ball extended beyond lesion: Yes   Outcome: patient tolerated procedure well with no complications   Post-procedure details: wound care instructions given    Rosacea Head - Anterior (Face)  Rosacea is a chronic progressive skin condition usually affecting the face of adults, causing redness and/or acne bumps. It is treatable but not curable. It sometimes affects the eyes (ocular rosacea) as well. It may respond to topical and/or systemic medication and can flare with stress, sun exposure, alcohol, exercise and some foods.  Daily application of broad spectrum spf 30+ sunscreen to face is recommended to reduce flares.  Will prescribe [facility] metronidazole/ivermectin/azelaic acid twice daily as needed to  affected areas on the face. The patient was advised this is not covered by insurance since it is made by a compounding pharmacy. They will receive an email to check out and the medication will be  mailed to their home.    AK (actinic keratosis) (3) Right cheek  Destruction of lesion - Right cheek Complexity: simple   Destruction method: cryotherapy   Informed consent: discussed and consent obtained   Timeout:  patient name, date of birth, surgical site, and procedure verified Lesion destroyed using liquid nitrogen: Yes   Region frozen until ice ball extended beyond lesion: Yes   Outcome: patient tolerated procedure well with no complications   Post-procedure details: wound care instructions given    Skin cancer screening   Return in about 1 year (around 04/17/2023) for TBSE.  I, Ashok Cordia, CMA, am acting as scribe for Sarina Ser, MD . Documentation: I have reviewed the above documentation for accuracy and completeness, and I agree with the above.  Sarina Ser, MD

## 2022-04-16 NOTE — Patient Instructions (Addendum)
Instructions for [facility] Medications  One or more of your medications was sent to the [facility] mail order compounding pharmacy. You will receive an email from them and can purchase the medicine through that link. It will then be mailed to your home at the address you confirmed. If for any reason you do not receive an email from them, please check your spam folder. If you still do not find the email, please let us know. [facility] phone number is [phone].    Cryotherapy Aftercare  Wash gently with soap and water everyday.   Apply Vaseline and Band-Aid daily until healed.    If You Need Anything After Your Visit  If you have any questions or concerns for your doctor, please call our main line at [phone] and press option 4 to reach your doctor's medical assistant. If no one answers, please leave a voicemail as directed and we will return your call as soon as possible. Messages left after 4 pm will be answered the following business day.   You may also send [place] a message via [place]. We typically respond to MyChart messages within 1-2 business days.  For prescription refills, please ask your pharmacy to contact our office. Our fax number is [phone].  If you have an urgent issue when the clinic is closed that cannot wait until the next business day, you can page your doctor at the number below.    Please note that while we do our best to be available for urgent issues outside of office hours, we are not available 24/7.   If you have an urgent issue and are unable to reach [place], you may choose to seek medical care at your doctor's office, retail clinic, urgent care center, or emergency room.  If you have a medical emergency, please immediately call 911 or go to the emergency department.  Pager Numbers  - Dr. Nehemiah Massed: [phone]  - Dr. Laurence Ferrari: [phone]  - Dr. Nicole Kindred: [phone]  In the event of inclement weather, please call our main line at  [phone] for an update on the status of any delays or closures.  Dermatology Medication Tips: Please keep the boxes that topical medications come in in order to help keep track of the instructions about where and how to use these. Pharmacies typically print the medication instructions only on the boxes and not directly on the medication tubes.   If your medication is too expensive, please contact our office at [phone] option 4 or send [place] a message through [place].   We are unable to tell what your co-pay for medications will be in advance as this is different depending on your insurance coverage. However, we may be able to find a substitute medication at lower cost or fill out paperwork to get insurance to cover a needed medication.   If a prior authorization is required to get your medication covered by your insurance company, please allow [place] 1-2 business days to complete this process.  Drug prices often vary depending on where the prescription is filled and some pharmacies may offer cheaper prices.  The website www.goodrx.com contains coupons for medications through different pharmacies. The prices here do not account for what the cost may be with help from insurance (it may be cheaper with your insurance), but the website can give you the price if you did not use any insurance.  - You can print the associated coupon and take it with your prescription to the pharmacy.  - You may also  stop by our office during regular business hours and pick up a GoodRx coupon card.  - If you need your prescription sent electronically to a different pharmacy, notify our office through [facility] or by phone at [phone] option 4.     Si Usted Necesita Algo Despus de Su Visita  Tambin puede enviarnos un mensaje a travs de Pharmacist, community. Por lo general respondemos a los mensajes de MyChart en el transcurso de 1 a 2 das hbiles.  Para renovar recetas, por favor pida a su farmacia que se  ponga en contacto con nuestra oficina. Harland Dingwall de fax es [place] [phone].  Si tiene un asunto urgente cuando la clnica est cerrada y que no puede esperar hasta el siguiente da hbil, puede llamar/localizar a su doctor(a) al nmero que aparece a continuacin.   Por favor, tenga en cuenta que aunque hacemos todo lo posible para estar disponibles para asuntos urgentes fuera del horario de [place], no estamos disponibles las 24 horas del da, los 7 das de la [place].   Si tiene un problema urgente y no puede comunicarse con nosotros, puede optar por buscar atencin mdica  en el consultorio de su doctor(a), en una clnica privada, en un centro de atencin urgente o en una sala de emergencias.  Si tiene Engineering geologist, por favor llame inmediatamente al 911 o vaya a la sala de emergencias.  Nmeros de bper  - Dr. Nehemiah Massed: [phone]  - Dra. Moye: [phone]  - Dra. Nicole Kindred: [phone]  En caso de inclemencias del [place], por favor llame a Johnsie Kindred principal al [phone] para una actualizacin sobre el [place] de cualquier retraso o cierre.  Consejos para la medicacin en dermatologa: Por favor, guarde las cajas en las que vienen los medicamentos de uso tpico para ayudarle a seguir las instrucciones sobre dnde y cmo usarlos. Las farmacias generalmente imprimen las instrucciones del medicamento slo en las cajas y no directamente en los tubos del [place].   Si su medicamento es muy caro, por favor, pngase en contacto con Zigmund Daniel llamando al [phone] y presione la opcin 4 o envenos un mensaje a travs de Pharmacist, community.   No podemos decirle cul ser su copago por los medicamentos por adelantado ya que esto es diferente dependiendo de la cobertura de su seguro. Sin embargo, es posible que podamos encontrar un medicamento sustituto a Electrical engineer un formulario para que el seguro cubra el medicamento que se considera necesario.   Si se requiere  una autorizacin previa para que su compaa de seguros [place] su medicamento, por favor permtanos de 1 a 2 das hbiles para completar este proceso.  Los precios de los medicamentos varan con frecuencia dependiendo del Environmental consultant de dnde se surte la receta y alguna farmacias pueden ofrecer precios ms baratos.  El sitio web www.goodrx.com tiene cupones para medicamentos de Airline pilot. Los precios aqu no tienen en cuenta lo que podra costar con la ayuda del seguro (puede ser ms barato con su seguro), pero el sitio web puede darle el precio si no utiliz Research scientist (physical sciences).  - Puede imprimir el cupn correspondiente y llevarlo con su receta a la farmacia.  - Tambin puede pasar por nuestra oficina durante el horario de atencin regular y Charity fundraiser una tarjeta de cupones de GoodRx.  - Si necesita que su receta se enve electrnicamente a Chiropodist, informe a nuestra oficina a travs de MyChart de [place] o por telfono llamando al [phone] y presione la opcin  4.  

## 2022-04-22 ENCOUNTER — Encounter: Payer: Self-pay | Admitting: Dermatology

## 2022-04-24 ENCOUNTER — Encounter: Payer: Self-pay | Admitting: Internal Medicine

## 2022-04-24 DIAGNOSIS — Z23 Encounter for immunization: Secondary | ICD-10-CM | POA: Diagnosis not present

## 2022-05-14 ENCOUNTER — Other Ambulatory Visit: Payer: Self-pay | Admitting: Internal Medicine

## 2022-05-14 DIAGNOSIS — Z1231 Encounter for screening mammogram for malignant neoplasm of breast: Secondary | ICD-10-CM

## 2022-06-13 ENCOUNTER — Other Ambulatory Visit: Payer: Self-pay | Admitting: Internal Medicine

## 2022-06-28 ENCOUNTER — Ambulatory Visit
Admission: RE | Admit: 2022-06-28 | Discharge: 2022-06-28 | Disposition: A | Discharge status: Home / Self Care | Payer: Medicare Other | Source: Ambulatory Visit | Attending: Internal Medicine | Admitting: Internal Medicine

## 2022-06-28 DIAGNOSIS — Z1231 Encounter for screening mammogram for malignant neoplasm of breast: Secondary | ICD-10-CM | POA: Diagnosis not present

## 2022-08-01 ENCOUNTER — Encounter: Payer: Self-pay | Admitting: Family Medicine

## 2022-08-01 ENCOUNTER — Ambulatory Visit (INDEPENDENT_AMBULATORY_CARE_PROVIDER_SITE_OTHER): Payer: Medicare Other | Admitting: Family Medicine

## 2022-08-01 ENCOUNTER — Other Ambulatory Visit: Payer: Self-pay | Admitting: Family Medicine

## 2022-08-01 VITALS — BP 118/68 | HR 68 | Temp 98.1°F | Ht 62.0 in | Wt 124.0 lb

## 2022-08-01 DIAGNOSIS — M7989 Other specified soft tissue disorders: Secondary | ICD-10-CM | POA: Diagnosis not present

## 2022-08-01 DIAGNOSIS — R2232 Localized swelling, mass and lump, left upper limb: Secondary | ICD-10-CM

## 2022-08-01 DIAGNOSIS — R2231 Localized swelling, mass and lump, right upper limb: Secondary | ICD-10-CM

## 2022-08-01 NOTE — Patient Instructions (Signed)
It was a pleasure meeting you today. Thank you for allowing me to take part in your health care.  Our goals for today as we discussed include:  I think this is a simple cyst.  To confirm I have ordered an ultrasound.  If there is anything else that needs to be done for evaluation will let you know once results have been reviewed.  If you do not have an appointment scheduled in 2 weeks please let the office know  Please follow-up with PCP as needed  If you have any questions or concerns, please do not hesitate to call the office at [phone].  I look forward to our next visit and until then take care and stay safe.  Regards,   Carollee Leitz, MD   [facility]

## 2022-08-01 NOTE — Progress Notes (Signed)
    SUBJECTIVE:   CHIEF COMPLAINT / HPI: lump in armpit  Patient reports small lump in right armpit.  Has been there for about 1 month.  Has not increased in size.  Reports has gotten a little smaller since first discovered.  Denies any pain, fevers or malaise.  Reports recently had mammogram and was negative.  PERTINENT  PMH / PSH:  None   OBJECTIVE:   BP 118/68 (BP Location: Left Arm, Patient Position: Sitting, Cuff Size: Normal)   Pulse 68   Temp 98.1 F (36.7 C) (Oral)   Ht '5\' 2"'$  (1.575 m)   Wt 124 lb (56.2 kg)   SpO2 99%   BMI 22.68 kg/m    General: Alert, no acute distress Cardio: Normal S1 and S2, RRR, no r/m/g Pulm: CTAB, normal work of breathing Right axilla: Small mobile, soft cyst like mass palpated anteriorly.  No edema, erythema or tenderness.  No lymphadenopathy appreciated.  ASSESSMENT/PLAN:   Soft tissue mass Suspect small lipoma.  Reviewed recent mammogram from 08/23, no evidence of malignancy or cysts.  -u/s soft tissue to confirm  -Follow up with result, can refer to dermatology if would like removal.    PDMP Reviewed  Carollee Leitz, MD

## 2022-08-07 ENCOUNTER — Ambulatory Visit
Admission: RE | Admit: 2022-08-07 | Discharge: 2022-08-07 | Disposition: A | Discharge status: Home / Self Care | Payer: Medicare Other | Source: Ambulatory Visit | Attending: Family Medicine | Admitting: Family Medicine

## 2022-08-07 DIAGNOSIS — R2231 Localized swelling, mass and lump, right upper limb: Secondary | ICD-10-CM | POA: Diagnosis not present

## 2022-08-07 DIAGNOSIS — M7989 Other specified soft tissue disorders: Secondary | ICD-10-CM | POA: Diagnosis not present

## 2022-08-08 ENCOUNTER — Encounter: Payer: Self-pay | Admitting: Family Medicine

## 2022-08-08 DIAGNOSIS — M7989 Other specified soft tissue disorders: Secondary | ICD-10-CM | POA: Insufficient documentation

## 2022-08-08 NOTE — Assessment & Plan Note (Signed)
Suspect small lipoma.  Reviewed recent mammogram from 08/23, no evidence of malignancy or cysts.  -u/s soft tissue to confirm  -Follow up with result, can refer to dermatology if would like removal.

## 2022-08-09 NOTE — Progress Notes (Signed)
noted 

## 2022-08-24 ENCOUNTER — Encounter: Payer: Self-pay | Admitting: Internal Medicine

## 2022-09-11 DIAGNOSIS — Z23 Encounter for immunization: Secondary | ICD-10-CM | POA: Diagnosis not present

## 2022-09-19 DIAGNOSIS — H26493 Other secondary cataract, bilateral: Secondary | ICD-10-CM | POA: Diagnosis not present

## 2022-09-19 DIAGNOSIS — Z961 Presence of intraocular lens: Secondary | ICD-10-CM | POA: Diagnosis not present

## 2022-09-19 DIAGNOSIS — H43813 Vitreous degeneration, bilateral: Secondary | ICD-10-CM | POA: Diagnosis not present

## 2022-09-20 ENCOUNTER — Encounter: Payer: Self-pay | Admitting: Internal Medicine

## 2022-12-18 ENCOUNTER — Other Ambulatory Visit: Payer: Self-pay | Admitting: Internal Medicine

## 2022-12-20 DIAGNOSIS — M2041 Other hammer toe(s) (acquired), right foot: Secondary | ICD-10-CM | POA: Diagnosis not present

## 2022-12-20 DIAGNOSIS — M79671 Pain in right foot: Secondary | ICD-10-CM | POA: Diagnosis not present

## 2023-01-09 ENCOUNTER — Other Ambulatory Visit: Payer: Self-pay | Admitting: Family

## 2023-01-30 ENCOUNTER — Telehealth: Payer: Self-pay | Admitting: Internal Medicine

## 2023-01-30 DIAGNOSIS — R7301 Impaired fasting glucose: Secondary | ICD-10-CM

## 2023-01-30 DIAGNOSIS — R5383 Other fatigue: Secondary | ICD-10-CM

## 2023-01-30 DIAGNOSIS — E034 Atrophy of thyroid (acquired): Secondary | ICD-10-CM

## 2023-01-30 DIAGNOSIS — E782 Mixed hyperlipidemia: Secondary | ICD-10-CM

## 2023-01-30 NOTE — Telephone Encounter (Signed)
I have pended labs for your approval.

## 2023-01-30 NOTE — Telephone Encounter (Signed)
Pt called in staying that she has an appt with provider 3/28 '@8'$ :30 am, and she was wondering if she can have fasting blood work a week prior to that appt?? Any questions, she's available '@[phone]'$ [phone].

## 2023-02-07 ENCOUNTER — Other Ambulatory Visit (INDEPENDENT_AMBULATORY_CARE_PROVIDER_SITE_OTHER): Payer: Medicare Other

## 2023-02-07 DIAGNOSIS — E782 Mixed hyperlipidemia: Secondary | ICD-10-CM | POA: Diagnosis not present

## 2023-02-07 DIAGNOSIS — R5383 Other fatigue: Secondary | ICD-10-CM

## 2023-02-07 DIAGNOSIS — R7301 Impaired fasting glucose: Secondary | ICD-10-CM

## 2023-02-07 DIAGNOSIS — E034 Atrophy of thyroid (acquired): Secondary | ICD-10-CM | POA: Diagnosis not present

## 2023-02-07 LAB — COMPREHENSIVE METABOLIC PANEL
ALT: 18 U/L (ref 0–35)
AST: 26 U/L (ref 0–37)
Albumin: 4.3 g/dL (ref 3.5–5.2)
Alkaline Phosphatase: 52 U/L (ref 39–117)
BUN: 20 mg/dL (ref 6–23)
CO2: 30 mEq/L (ref 19–32)
Calcium: 10 mg/dL (ref 8.4–10.5)
Chloride: 102 mEq/L (ref 96–112)
Creatinine, Ser: 0.82 mg/dL (ref 0.40–1.20)
GFR: 67.81 mL/min (ref >60.00)
Glucose, Bld: 93 mg/dL (ref 70–99)
Potassium: 4 mEq/L (ref 3.5–5.1)
Sodium: 140 mEq/L (ref 135–145)
Total Bilirubin: 0.5 mg/dL (ref 0.2–1.2)
Total Protein: 7.2 g/dL (ref 6.0–8.3)

## 2023-02-07 LAB — LIPID PANEL
Cholesterol: 211 mg/dL — ABNORMAL HIGH (ref 0–200)
HDL: 85.2 mg/dL (ref >39.00)
LDL Cholesterol: 112 mg/dL — ABNORMAL HIGH (ref 0–99)
NonHDL: 126.24
Total CHOL/HDL Ratio: 2
Triglycerides: 71 mg/dL (ref 0.0–149.0)
VLDL: 14.2 mg/dL (ref 0.0–40.0)

## 2023-02-07 LAB — CBC WITH DIFFERENTIAL/PLATELET
Basophils Absolute: 0.1 10*3/uL (ref 0.0–0.1)
Basophils Relative: 1.1 % (ref 0.0–3.0)
Eosinophils Absolute: 0.1 10*3/uL (ref 0.0–0.7)
Eosinophils Relative: 2 % (ref 0.0–5.0)
HCT: 39.6 % (ref 36.0–46.0)
Hemoglobin: 13.5 g/dL (ref 12.0–15.0)
Lymphocytes Relative: 35.5 % (ref 12.0–46.0)
Lymphs Abs: 1.8 10*3/uL (ref 0.7–4.0)
MCHC: 34.1 g/dL (ref 30.0–36.0)
MCV: 94.2 fl (ref 78.0–100.0)
Monocytes Absolute: 0.5 10*3/uL (ref 0.1–1.0)
Monocytes Relative: 10.5 % (ref 3.0–12.0)
Neutro Abs: 2.5 10*3/uL (ref 1.4–7.7)
Neutrophils Relative %: 50.9 % (ref 43.0–77.0)
Platelets: 233 10*3/uL (ref 150.0–400.0)
RBC: 4.2 Mil/uL (ref 3.87–5.11)
RDW: 13.3 % (ref 11.5–15.5)
WBC: 5 10*3/uL (ref 4.0–10.5)

## 2023-02-07 LAB — TSH: TSH: 1.44 u[IU]/mL (ref 0.35–5.50)

## 2023-02-07 LAB — HEMOGLOBIN A1C: Hgb A1c MFr Bld: 5.7 % (ref 4.6–6.5)

## 2023-02-07 LAB — LDL CHOLESTEROL, DIRECT: Direct LDL: 106 mg/dL

## 2023-02-21 ENCOUNTER — Encounter: Payer: Self-pay | Admitting: Internal Medicine

## 2023-02-21 ENCOUNTER — Ambulatory Visit (INDEPENDENT_AMBULATORY_CARE_PROVIDER_SITE_OTHER): Payer: Medicare Other | Admitting: Internal Medicine

## 2023-02-21 VITALS — BP 118/72 | HR 61 | Temp 97.7°F | Ht 63.0 in | Wt 124.2 lb

## 2023-02-21 DIAGNOSIS — H01001 Unspecified blepharitis right upper eyelid: Secondary | ICD-10-CM | POA: Diagnosis not present

## 2023-02-21 DIAGNOSIS — Z1231 Encounter for screening mammogram for malignant neoplasm of breast: Secondary | ICD-10-CM | POA: Diagnosis not present

## 2023-02-21 DIAGNOSIS — R419 Unspecified symptoms and signs involving cognitive functions and awareness: Secondary | ICD-10-CM

## 2023-02-21 DIAGNOSIS — E782 Mixed hyperlipidemia: Secondary | ICD-10-CM | POA: Diagnosis not present

## 2023-02-21 DIAGNOSIS — H01003 Unspecified blepharitis right eye, unspecified eyelid: Secondary | ICD-10-CM | POA: Insufficient documentation

## 2023-02-21 DIAGNOSIS — R5383 Other fatigue: Secondary | ICD-10-CM

## 2023-02-21 DIAGNOSIS — E538 Deficiency of other specified B group vitamins: Secondary | ICD-10-CM | POA: Diagnosis not present

## 2023-02-21 DIAGNOSIS — M810 Age-related osteoporosis without current pathological fracture: Secondary | ICD-10-CM

## 2023-02-21 DIAGNOSIS — E559 Vitamin D deficiency, unspecified: Secondary | ICD-10-CM

## 2023-02-21 DIAGNOSIS — K5900 Constipation, unspecified: Secondary | ICD-10-CM | POA: Diagnosis not present

## 2023-02-21 DIAGNOSIS — M7989 Other specified soft tissue disorders: Secondary | ICD-10-CM | POA: Diagnosis not present

## 2023-02-21 DIAGNOSIS — E034 Atrophy of thyroid (acquired): Secondary | ICD-10-CM

## 2023-02-21 DIAGNOSIS — R7301 Impaired fasting glucose: Secondary | ICD-10-CM | POA: Diagnosis not present

## 2023-02-21 DIAGNOSIS — H01004 Unspecified blepharitis left upper eyelid: Secondary | ICD-10-CM

## 2023-02-21 MED ORDER — LEVOTHYROXINE SODIUM 75 MCG PO TABS: 75.0000 ug | ORAL_TABLET | Freq: Every day | ORAL | 1 refills | Status: DC

## 2023-02-21 MED ORDER — ERYTHROMYCIN 5 MG/GM OP OINT: 1.0000 | TOPICAL_OINTMENT | Freq: Every day | OPHTHALMIC | 0 refills | Status: AC

## 2023-02-21 NOTE — Patient Instructions (Addendum)
  1) for your  sleep issues:  You might want to try using Relaxium for insomnia  (as seen on TV commercials) . It is available through [employer] and contains all natural supplements:  Melatonin 5 mg  Chamomile 25 mg Passionflower extract 75 mg GABA 100 mg Ashwaganda extract 125 mg Magnesium citrate, glycinate, oxide (100 mg)  L tryptophan 500 mg Valerest (proprietary  ingredient ; probably valeria root extract)    2)  You can take   a daily bulk forming laxative (Citrucel, Metamucil, Benefiber, , or Fibercon) 30 minutes prior to her largest meal to  improve your bowel patterns   3)  for daily use tylenol is MUCH SAFER THAN IBUPROFEN OR   4) blepharitis :   erythromycin ointment prescribed.  use systane or Colyrium when eyes feel irritated,   5) Your  DEXA scan  been ordered. Please get it done before your 3 month follow up so we can discuss any treatments that may be needed.    You are encouraged (required) to call to schedule your own  appointment at [facility]  , and their phone number is [phone]    6) Return in 3 months for a neurologic/memory exam

## 2023-02-21 NOTE — Assessment & Plan Note (Signed)
Advised to return in 3 months for MMSE

## 2023-02-21 NOTE — Progress Notes (Signed)
Patient ID: DANN MAGISTRO, female    DOB: 11/29/1940  Age: 82 y.o. MRN: [id number]  The patient is here for follow up and management of other chronic and acute problems.   The risk factors are reflected in the social history.  The roster of all physicians providing medical care to patient - is listed in the Snapshot section of the chart.  Activities of daily living:  The patient is 100% independent in all ADLs: dressing, toileting, feeding as well as independent mobility  Home safety : The patient has smoke detectors in the home. They wear seatbelts.  There are no firearms at home. There is no violence in the home.   There is no risks for hepatitis, STDs or HIV. There is no   history of blood transfusion. They have no travel history to infectious disease endemic areas of the world.  The patient has seen their dentist in the last six month. They have seen their eye doctor in the last year. They admit to slight hearing difficulty with regard to whispered voices and some television programs.  They have deferred audiologic testing in the last year.  They do not  have excessive sun exposure. Discussed the need for sun protection: hats, long sleeves and use of sunscreen if there is significant sun exposure.   Diet: the importance of a healthy diet is discussed. They do have a healthy diet.  The benefits of regular aerobic exercise were discussed. She walks 4 times per week ,  20 minutes.   Depression screen: there are no signs or vegative symptoms of depression- irritability, change in appetite, anhedonia, sadness/tearfullness.  Cognitive assessment: the patient manages all their financial and personal affairs and is actively engaged. They could relate day,date,year and events; recalled 2/3 objects at 3 minutes; performed clock-face test normally.  The following portions of the patient's history were reviewed and updated as appropriate: allergies, current medications, past family history, past  medical history,  past surgical history, past social history  and problem list.  Visual acuity was not assessed per patient preference since she has regular follow up with her ophthalmologist. Hearing and body mass index were assessed and reviewed.   During the course of the visit the patient was educated and counseled about appropriate screening and preventive services including : fall prevention , diabetes screening, nutrition counseling, colorectal cancer screening, and recommended immunizations.    CC: The primary encounter diagnosis was Hypothyroidism due to acquired atrophy of thyroid. 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   1) difficulty staying asleep after falling asleep easily   Discussed natural supplements  2) recurrent episodes of constipation followed by loose stools.    3) chronic right shoulder pain, prior surgery 12 yrs ago.  GETTING acupuncture in [place] which is helping .  Not using NSAIDS   4) 2 week history of eyelid irritation now has a stye on right eye and is spreading to the left eyelid   History [place] has a past medical history of Actinic keratoses, Arthritis, Carotid artery occlusion, Fracture (Sep 11 2008), Headache(784.0), Hyperlipidemia, Hypothyroidism, Idiopathic parathyroidism ([place]), Primary hyperparathyroidism ([place]), and Thyroid disease.   She has a past surgical history that includes Carpal tunnel release (2000); Cesarean section; Rotator cuff  repair (2013); Parathyroidectomy (Left, 03/23/2014); Cataract extraction w/PHACO (Right, 12/06/2020); and Cataract extraction w/PHACO (Left, 01/03/2021).   Her family  history includes Alzheimer's disease in her mother; Arthritis in an other family member; Dementia in her maternal grandmother and mother; Heart attack in her father and mother; Heart disease in her father; Heart failure in her father.She reports that she has never smoked. She has never used smokeless tobacco. She reports current alcohol use of about 7.0 standard drinks of alcohol per week. She reports that she does not use drugs.  Outpatient Medications Prior to Visit  Medication Sig Dispense Refill   aspirin-acetaminophen-caffeine (EXCEDRIN MIGRAINE) 250-250-65 MG per tablet Take 1 tablet by mouth every 6 (six) hours as needed for headache.     Biotin 2500 MCG CAPS Take by mouth.     calcium-vitamin D (OSCAL WITH D) 500-200 MG-UNIT per tablet Take 1 tablet by mouth daily with breakfast.     diclofenac Sodium (VOLTAREN) 1 % GEL Apply 4 g topically 4 (four) times daily. 50 g 3   ibuprofen (ADVIL,MOTRIN) 200 MG tablet Take 200-400 mg by mouth every 6 (six) hours as needed for mild pain or moderate pain.     Multiple Vitamin (MULTIVITAMIN WITH MINERALS) TABS tablet Take 1 tablet by mouth daily.     simvastatin (ZOCOR) 40 MG tablet TAKE 1 TABLET BY MOUTH AT BEDTIME 90 tablet 1   levothyroxine (SYNTHROID) 75 MCG tablet TAKE 1 TAB BY MOUTH ONCE DAILY. TAKE ON AN EMPTY STOMACH WITH A GLASS OF WATER ATLEAST 30-60 MINUTES BEFORE BREAKFAST 90 tablet 1   No facility-administered medications prior to visit.    Review of Systems  Patient denies headache, fevers, malaise, unintentional weight loss, skin rash, eye pain, sinus congestion and sinus pain, sore throat, dysphagia,  hemoptysis , cough, dyspnea, wheezing, chest pain, palpitations, orthopnea, edema, abdominal pain, nausea, melena, flank pain, dysuria, hematuria, urinary  Frequency, nocturia, numbness, tingling, seizures,  Focal weakness, Loss of consciousness,  Tremor, insomnia, depression, anxiety, and suicidal ideation.     Objective:  BP 118/72    Pulse 61   Temp 97.7 F (36.5 C) (Oral)   Ht 5\' 3"  (1.6 m)   Wt 124 lb 3.2 oz (56.3 kg)   SpO2 99%   BMI 22.00 kg/m   Physical Exam Vitals reviewed.  Constitutional:      General: She is not in acute distress.    Appearance: Normal appearance. She is well-developed and normal weight. She is not ill-appearing, toxic-appearing or diaphoretic.  HENT:     Head: Normocephalic.     Right Ear: Tympanic membrane, ear canal and external ear normal. There is no impacted cerumen.     Left Ear: Tympanic membrane, ear canal and external ear normal. There is no impacted cerumen.     Nose: Nose normal.     Mouth/Throat:     Mouth: Mucous membranes are moist.     Pharynx: Oropharynx is clear.  Eyes:     General: No scleral icterus.       Right eye: No discharge.        Left eye: No discharge.     Conjunctiva/sclera: Conjunctivae normal.     Pupils: Pupils are equal, round, and reactive to light.  Neck:     Thyroid: No thyromegaly.     Vascular: No carotid bruit or JVD.  Cardiovascular:     Rate and Rhythm: Normal rate and regular rhythm.     Heart sounds: Normal heart sounds.  Pulmonary:  Effort: Pulmonary effort is normal. No respiratory distress.     Breath sounds: Normal breath sounds.  Chest:  Breasts:    Breasts are symmetrical.     Right: Normal. No swelling, inverted nipple, mass, nipple discharge, skin change or tenderness.     Left: Normal. No swelling, inverted nipple, mass, nipple discharge, skin change or tenderness.  Abdominal:     General: Bowel sounds are normal.     Palpations: Abdomen is soft. There is no mass.     Tenderness: There is no abdominal tenderness. There is no guarding or rebound.  Musculoskeletal:        General: Normal range of motion.     Cervical back: Normal range of motion and neck supple.  Lymphadenopathy:     Cervical: No cervical adenopathy.     Upper Body:     Right upper body: No supraclavicular, axillary or pectoral adenopathy.     Left  upper body: No supraclavicular, axillary or pectoral adenopathy.  Skin:    General: Skin is warm and dry.  Neurological:     General: No focal deficit present.     Mental Status: She is alert and oriented to person, place, and time. Mental status is at baseline.  Psychiatric:        Mood and Affect: Mood normal.        Behavior: Behavior normal.        Thought Content: Thought content normal.        Judgment: Judgment normal.    Assessment & Plan:  Hypothyroidism due to acquired atrophy of thyroid Assessment & Plan: Thyroid function is WNL on current dose of 75 mcg daily    Lab Results  Component Value Date   TSH 1.44 02/07/2023     Orders: -     TSH; Future  Mixed hyperlipidemia Assessment & Plan: Continue rimary prevention with simvastatin given presence of renal artery and carotid artery stenoses .  / Lab Results  Component Value Date   CHOL 211 (H) 02/07/2023   HDL 85.20 02/07/2023   LDLCALC 112 (H) 02/07/2023   LDLDIRECT 106.0 02/07/2023   TRIG 71.0 02/07/2023   CHOLHDL 2 02/07/2023     Orders: -     Lipid panel; Future -     LDL cholesterol, direct; Future  Impaired fasting glucose -     Hemoglobin A1c; Future -     Comprehensive metabolic panel; Future  Other fatigue -     CBC with Differential/Platelet; Future  Encounter for screening mammogram for malignant neoplasm of breast -     3D Screening Mammogram, Left and Right; Future  Osteoporosis of forearm -     DG Bone Density; Future  B12 deficiency -     B12 and Folate Panel; Future  Vitamin D deficiency -     VITAMIN D 25 Hydroxy (Vit-D Deficiency, Fractures); Future  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  Advised to discard all eye makeup    Constipation, unspecified constipation type Assessment & Plan: Advised to add  a daily bulk forming laxative (Citrucel, Metamucil, Benefiber, or Fibercon)    Cognitive  complaints Assessment & Plan: Advised to return in 3 months for MMSE   Hypercalcemia Assessment & Plan: Remains resolved with parathyroidectomy.  Lab Results  Component Value Date   CALCIUM 10.0 02/07/2023   PHOS 2.8 12/16/2013     Soft tissue mass Assessment & Plan: Ultrasound of right axilla  done Sept 2023 suggestive of 1.3 cm lipoma    Other orders -     Levothyroxine Sodium; Take 1 tablet (75 mcg total) by mouth daily before breakfast.  Dispense: 90 tablet; Refill: 1 -     Erythromycin; Place 1 Application into both eyes at bedtime for 7 days.  Dispense: 7 g; Refill: 0      I provided 38 minutes of  face-to-face time during this encounter reviewing patient's current concerns and past surgeries, labs and imaging studies, providing counseling on the above mentioned problems , and coordination  of care .   Follow-up: Return in about 3 months (around 05/24/2023).   Crecencio Mc, MD

## 2023-02-21 NOTE — Assessment & Plan Note (Signed)
Advised to add  a daily bulk forming laxative (Citrucel, Metamucil, Benefiber, or Fibercon)

## 2023-02-22 NOTE — Assessment & Plan Note (Signed)
Thyroid function is WNL on current dose of 75 mcg daily    Lab Results  Component Value Date   TSH 1.44 02/07/2023

## 2023-02-22 NOTE — Assessment & Plan Note (Signed)
Ultrasound of right axilla done Sept 2023 suggestive of 1.3 cm lipoma

## 2023-02-22 NOTE — Assessment & Plan Note (Signed)
Remains resolved with parathyroidectomy.  Lab Results  Component Value Date   CALCIUM 10.0 02/07/2023   PHOS 2.8 12/16/2013

## 2023-02-22 NOTE — Assessment & Plan Note (Signed)
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  / Lab Results  Component Value Date   CHOL 211 (H) 02/07/2023   HDL 85.20 02/07/2023   LDLCALC 112 (H) 02/07/2023   LDLDIRECT 106.0 02/07/2023   TRIG 71.0 02/07/2023   CHOLHDL 2 02/07/2023

## 2023-02-22 NOTE — Assessment & Plan Note (Signed)
Erythomycin ointment prescribed .  Advised to discard all eye makeup

## 2023-02-26 DIAGNOSIS — H0014 Chalazion left upper eyelid: Secondary | ICD-10-CM | POA: Diagnosis not present

## 2023-02-26 DIAGNOSIS — H0011 Chalazion right upper eyelid: Secondary | ICD-10-CM | POA: Diagnosis not present

## 2023-02-26 DIAGNOSIS — H02883 Meibomian gland dysfunction of right eye, unspecified eyelid: Secondary | ICD-10-CM | POA: Diagnosis not present

## 2023-03-19 ENCOUNTER — Telehealth: Payer: Self-pay | Admitting: Internal Medicine

## 2023-03-19 NOTE — Telephone Encounter (Signed)
Copied from CRM [id number]. Topic: Medicare AWV >> Mar 19, 2023 11:30 AM Rushie Goltz wrote: Reason for CRM: Called patient to schedule Medicare Annual Wellness Visit (AWV). Left message for patient to call back and schedule Medicare Annual Wellness Visit (AWV).  Make sure Medicare is primary before scheduling AWV.  If Medicare is secondary to [employer], do not schedule appointment for AWV.  If Medicare is primary, schedule AWV.  Last date of AWV: 01/15/2022  Please schedule an AWVS appointment at any time with [facility] [place] VISIT.  If any questions, please contact me at [phone].    Thank you,  [facility] Direct dial  [phone]

## 2023-03-21 DIAGNOSIS — H0014 Chalazion left upper eyelid: Secondary | ICD-10-CM | POA: Diagnosis not present

## 2023-03-21 DIAGNOSIS — H0011 Chalazion right upper eyelid: Secondary | ICD-10-CM | POA: Diagnosis not present

## 2023-04-17 ENCOUNTER — Ambulatory Visit: Payer: Federal, State, Local not specified - PPO | Admitting: Dermatology

## 2023-04-23 DIAGNOSIS — H0014 Chalazion left upper eyelid: Secondary | ICD-10-CM | POA: Diagnosis not present

## 2023-04-23 DIAGNOSIS — H04123 Dry eye syndrome of bilateral lacrimal glands: Secondary | ICD-10-CM | POA: Diagnosis not present

## 2023-04-23 DIAGNOSIS — H0011 Chalazion right upper eyelid: Secondary | ICD-10-CM | POA: Diagnosis not present

## 2023-04-23 DIAGNOSIS — H1013 Acute atopic conjunctivitis, bilateral: Secondary | ICD-10-CM | POA: Diagnosis not present

## 2023-05-09 ENCOUNTER — Ambulatory Visit: Payer: Medicare Other | Admitting: Dermatology

## 2023-05-09 VITALS — BP 145/80

## 2023-05-09 DIAGNOSIS — L578 Other skin changes due to chronic exposure to nonionizing radiation: Secondary | ICD-10-CM

## 2023-05-09 DIAGNOSIS — D1801 Hemangioma of skin and subcutaneous tissue: Secondary | ICD-10-CM

## 2023-05-09 DIAGNOSIS — L719 Rosacea, unspecified: Secondary | ICD-10-CM | POA: Diagnosis not present

## 2023-05-09 DIAGNOSIS — L570 Actinic keratosis: Secondary | ICD-10-CM

## 2023-05-09 DIAGNOSIS — W908XXA Exposure to other nonionizing radiation, initial encounter: Secondary | ICD-10-CM

## 2023-05-09 DIAGNOSIS — Z1283 Encounter for screening for malignant neoplasm of skin: Secondary | ICD-10-CM

## 2023-05-09 DIAGNOSIS — D229 Melanocytic nevi, unspecified: Secondary | ICD-10-CM

## 2023-05-09 DIAGNOSIS — X32XXXA Exposure to sunlight, initial encounter: Secondary | ICD-10-CM | POA: Diagnosis not present

## 2023-05-09 DIAGNOSIS — Z79899 Other long term (current) drug therapy: Secondary | ICD-10-CM

## 2023-05-09 DIAGNOSIS — L814 Other melanin hyperpigmentation: Secondary | ICD-10-CM | POA: Diagnosis not present

## 2023-05-09 DIAGNOSIS — L821 Other seborrheic keratosis: Secondary | ICD-10-CM | POA: Diagnosis not present

## 2023-05-09 NOTE — Progress Notes (Signed)
Follow-Up Visit   Subjective  Raven [name] is a 82 y.o. female who presents for the following: Skin Cancer Screening and Full Body Skin Exam, check spot R temple scaly, txted with LN2 in past, hx of Aks, Rosacea face, skin medicinals triple cream qhs  The patient presents for Total-Body Skin Exam (TBSE) for skin cancer screening and mole check. The patient has spots, moles and lesions to be evaluated, some may be new or changing and the patient has concerns that these could be cancer.    The following portions of the chart were reviewed this encounter and updated as appropriate: medications, allergies, medical history  Review of Systems:  No other skin or systemic complaints except as noted in HPI or Assessment and Plan.  Objective  Well appearing patient in no apparent distress; mood and affect are within normal limits.  A full examination was performed including scalp, head, eyes, ears, nose, lips, neck, chest, axillae, abdomen, back, buttocks, bilateral upper extremities, bilateral lower extremities, hands, feet, fingers, toes, fingernails, and toenails. All findings within normal limits unless otherwise noted below.   Relevant physical exam findings are noted in the Assessment and Plan.  R upper lip x 1, L nose x 1, L zygoma x 1 (3) Pink scaly macules           Assessment & Plan   LENTIGINES, SEBORRHEIC KERATOSES, HEMANGIOMAS - Benign normal skin lesions - Benign-appearing - Call for any changes  MELANOCYTIC NEVI - Tan-brown and/or pink-flesh-colored symmetric macules and papules - Benign appearing on exam today - Observation - Call clinic for new or changing moles - Recommend daily use of broad spectrum spf 30+ sunscreen to sun-exposed areas.   ACTINIC DAMAGE - Chronic condition, secondary to cumulative UV/sun exposure - diffuse scaly erythematous macules with underlying dyspigmentation - Recommend daily broad spectrum sunscreen SPF 30+ to sun-exposed areas,  reapply every 2 hours as needed.  - Staying in the shade or wearing long sleeves, sun glasses (UVA+UVB protection) and wide brim hats (4-inch brim around the entire circumference of the hat) are also recommended for sun protection.  - Call for new or changing lesions.  SKIN CANCER SCREENING PERFORMED TODAY.  ROSACEA Exam Mid face erythema with telangiectasias   Chronic condition with duration or expected duration over one year. Currently well-controlled.   Rosacea is a chronic progressive skin condition usually affecting the face of adults, causing redness and/or acne bumps. It is treatable but not curable. It sometimes affects the eyes (ocular rosacea) as well. It may respond to topical and/or systemic medication and can flare with stress, sun exposure, alcohol, exercise, topical steroids (including hydrocortisone/cortisone 10) and some foods.  Daily application of broad spectrum spf 30+ sunscreen to face is recommended to reduce flares.  Patient denies grittiness of the eyes  Treatment Plan Cont SM Triple cream qhs   AK (actinic keratosis) (3) R upper lip x 1, L nose x 1, L zygoma x 1  Destruction of lesion - R upper lip x 1, L nose x 1, L zygoma x 1 Complexity: simple   Destruction method: cryotherapy   Informed consent: discussed and consent obtained   Timeout:  patient name, date of birth, surgical site, and procedure verified Lesion destroyed using liquid nitrogen: Yes   Region frozen until ice ball extended beyond lesion: Yes   Outcome: patient tolerated procedure well with no complications   Post-procedure details: wound care instructions given     Return in about 4 months (around  09/08/2023) for AK f/u.  I, Ardis Rowan, RMA, am acting as scribe for Armida Sans, MD .   Documentation: I have reviewed the above documentation for accuracy and completeness, and I agree with the above.  Armida Sans, MD

## 2023-05-09 NOTE — Patient Instructions (Addendum)
For Rosacea In the morning use your sunscreen to face In the evening use your Skin Medicinals Triple cream to face   Cryotherapy Aftercare  Wash gently with soap and water everyday.   Apply Vaseline and Band-Aid daily until healed.   Due to recent changes in healthcare laws, you may see results of your pathology and/or laboratory studies on MyChart before the doctors have had a chance to review them. We understand that in some cases there may be results that are confusing or concerning to you. Please understand that not all results are received at the same time and often the doctors may need to interpret multiple results in order to provide you with the best plan of care or course of treatment. Therefore, we ask that you please give [place] 2 business days to thoroughly review all your results before contacting the office for clarification. Should we see a critical lab result, you will be contacted sooner.   If You Need Anything After Your Visit  If you have any questions or concerns for your doctor, please call our main line at [phone] and press option 4 to reach your doctor's medical assistant. If no one answers, please leave a voicemail as directed and we will return your call as soon as possible. Messages left after 4 pm will be answered the following business day.   You may also send [place] a message via MyChart. We typically respond to MyChart messages within 1-2 business days.  For prescription refills, please ask your pharmacy to contact our office. Our fax number is [phone].  If you have an urgent issue when the clinic is closed that cannot wait until the next business day, you can page your doctor at the number below.    Please note that while we do our best to be available for urgent issues outside of office hours, we are not available 24/7.   If you have an urgent issue and are unable to reach [place], you may choose to seek medical care at your doctor's office, retail clinic, urgent  care center, or emergency room.  If you have a medical emergency, please immediately call 911 or go to the emergency department.  Pager Numbers  - Dr. Gwen Pounds: [phone]  - Dr. Neale Burly: [phone]  - Dr. Roseanne Reno: [phone]  In the event of inclement weather, please call our main line at [phone] for an update on the status of any delays or closures.  Dermatology Medication Tips: Please keep the boxes that topical medications come in in order to help keep track of the instructions about where and how to use these. Pharmacies typically print the medication instructions only on the boxes and not directly on the medication tubes.   If your medication is too expensive, please contact our office at [phone] option 4 or send [place] a message through MyChart.   We are unable to tell what your co-pay for medications will be in advance as this is different depending on your insurance coverage. However, we may be able to find a substitute medication at lower cost or fill out paperwork to get insurance to cover a needed medication.   If a prior authorization is required to get your medication covered by your insurance company, please allow [place] 1-2 business days to complete this process.  Drug prices often vary depending on where the prescription is filled and some pharmacies may offer cheaper prices.  The website www.goodrx.com contains coupons for medications through different pharmacies. The prices here do not account  for what the cost may be with help from insurance (it may be cheaper with your insurance), but the website can give you the price if you did not use any insurance.  - You can print the associated coupon and take it with your prescription to the pharmacy.  - You may also stop by our office during regular business hours and pick up a GoodRx coupon card.  - If you need your prescription sent electronically to a different pharmacy, notify our office through [facility] or  by phone at [phone] option 4.     Si Usted Necesita Algo Despus de Su Visita  Tambin puede enviarnos un mensaje a travs de Clinical cytogeneticist. Por lo general respondemos a los mensajes de MyChart en el transcurso de 1 a 2 das hbiles.  Para renovar recetas, por favor pida a su farmacia que se ponga en contacto con nuestra oficina. Annie Sable de fax es [place] [phone].  Si tiene un asunto urgente cuando la clnica est cerrada y que no puede esperar hasta el siguiente da hbil, puede llamar/localizar a su doctor(a) al nmero que aparece a continuacin.   Por favor, tenga en cuenta que aunque hacemos todo lo posible para estar disponibles para asuntos urgentes fuera del horario de [place], no estamos disponibles las 24 horas del da, los 7 [address]  [address] de la [place].   Si tiene un problema urgente y no puede comunicarse con nosotros, puede optar por buscar atencin mdica  en el consultorio de su doctor(a), en una clnica privada, en un centro de atencin urgente o en una sala de emergencias.  Si tiene Engineer, drilling, por favor llame inmediatamente al 911 o vaya a la sala de emergencias.  Nmeros de bper  - Dr. Gwen Pounds: [phone]  - Dra. Moye: [phone]  - Dra. Roseanne Reno: [phone]  En caso de inclemencias del [place], por favor llame a Lacy Duverney principal al [phone] para una actualizacin sobre el [place] de cualquier retraso o cierre.  Consejos para la medicacin en dermatologa: Por favor, guarde las cajas en las que vienen los medicamentos de uso tpico para ayudarle a seguir las instrucciones sobre dnde y cmo usarlos. Las farmacias generalmente imprimen las instrucciones del medicamento slo en las cajas y no directamente en los tubos del [place].   Si su medicamento es muy caro, por favor, pngase en contacto con Rolm Gala llamando al [phone] y presione la opcin 4 o envenos un mensaje a travs de Clinical cytogeneticist.   No podemos decirle cul ser su  copago por los medicamentos por adelantado ya que esto es diferente dependiendo de la cobertura de su seguro. Sin embargo, es posible que podamos encontrar un medicamento sustituto a Audiological scientist un formulario para que el seguro cubra el medicamento que se considera necesario.   Si se requiere una autorizacin previa para que su compaa de seguros [place] su medicamento, por favor permtanos de 1 a 2 das hbiles para completar [address]  Los precios de los medicamentos varan con frecuencia dependiendo del Environmental consultant de dnde se surte la receta y alguna farmacias pueden ofrecer precios ms baratos.  El sitio web www.goodrx.com tiene cupones para medicamentos de Health and safety inspector. Los precios aqu no tienen en cuenta lo que podra costar con la ayuda del seguro (puede ser ms barato con su seguro), pero el sitio web puede darle el precio si no utiliz Tourist information centre manager.  - Puede imprimir el cupn correspondiente y llevarlo con su receta a la farmacia.  Laroy Apple  puede pasar por nuestra oficina durante el horario de atencin regular y Charity fundraiser una tarjeta de cupones de GoodRx.  - Si necesita que su receta se enve electrnicamente a una farmacia diferente, informe a nuestra oficina a travs de MyChart de [place] o por telfono llamando al [phone] y presione la opcin 4.

## 2023-05-11 ENCOUNTER — Encounter: Payer: Self-pay | Admitting: Dermatology

## 2023-06-03 ENCOUNTER — Other Ambulatory Visit: Payer: Federal, State, Local not specified - PPO

## 2023-06-03 ENCOUNTER — Other Ambulatory Visit (INDEPENDENT_AMBULATORY_CARE_PROVIDER_SITE_OTHER): Payer: Medicare Other

## 2023-06-03 DIAGNOSIS — R5383 Other fatigue: Secondary | ICD-10-CM | POA: Diagnosis not present

## 2023-06-03 DIAGNOSIS — E782 Mixed hyperlipidemia: Secondary | ICD-10-CM

## 2023-06-03 DIAGNOSIS — R7301 Impaired fasting glucose: Secondary | ICD-10-CM | POA: Diagnosis not present

## 2023-06-03 DIAGNOSIS — E538 Deficiency of other specified B group vitamins: Secondary | ICD-10-CM

## 2023-06-03 DIAGNOSIS — E559 Vitamin D deficiency, unspecified: Secondary | ICD-10-CM | POA: Diagnosis not present

## 2023-06-03 DIAGNOSIS — E034 Atrophy of thyroid (acquired): Secondary | ICD-10-CM

## 2023-06-03 LAB — CBC WITH DIFFERENTIAL/PLATELET
Basophils Absolute: 0.1 10*3/uL (ref 0.0–0.1)
Basophils Relative: 1 % (ref 0.0–3.0)
Eosinophils Absolute: 0.1 10*3/uL (ref 0.0–0.7)
Eosinophils Relative: 2.9 % (ref 0.0–5.0)
HCT: 38.5 % (ref 36.0–46.0)
Hemoglobin: 12.7 g/dL (ref 12.0–15.0)
Lymphocytes Relative: 34.8 % (ref 12.0–46.0)
Lymphs Abs: 1.7 10*3/uL (ref 0.7–4.0)
MCHC: 33 g/dL (ref 30.0–36.0)
MCV: 95.3 fl (ref 78.0–100.0)
Monocytes Absolute: 0.4 10*3/uL (ref 0.1–1.0)
Monocytes Relative: 8.9 % (ref 3.0–12.0)
Neutro Abs: 2.6 10*3/uL (ref 1.4–7.7)
Neutrophils Relative %: 52.4 % (ref 43.0–77.0)
Platelets: 243 10*3/uL (ref 150.0–400.0)
RBC: 4.04 Mil/uL (ref 3.87–5.11)
RDW: 13.5 % (ref 11.5–15.5)
WBC: 4.9 10*3/uL (ref 4.0–10.5)

## 2023-06-03 LAB — LIPID PANEL
Cholesterol: 212 mg/dL — ABNORMAL HIGH (ref 0–200)
HDL: 74.3 mg/dL (ref >39.00)
LDL Cholesterol: 122 mg/dL — ABNORMAL HIGH (ref 0–99)
NonHDL: 137.2
Total CHOL/HDL Ratio: 3
Triglycerides: 76 mg/dL (ref 0.0–149.0)
VLDL: 15.2 mg/dL (ref 0.0–40.0)

## 2023-06-03 LAB — COMPREHENSIVE METABOLIC PANEL
ALT: 15 U/L (ref 0–35)
AST: 22 U/L (ref 0–37)
Albumin: 4.2 g/dL (ref 3.5–5.2)
Alkaline Phosphatase: 41 U/L (ref 39–117)
BUN: 19 mg/dL (ref 6–23)
CO2: 31 mEq/L (ref 19–32)
Calcium: 9.7 mg/dL (ref 8.4–10.5)
Chloride: 102 mEq/L (ref 96–112)
Creatinine, Ser: 0.78 mg/dL (ref 0.40–1.20)
GFR: 71.84 mL/min (ref >60.00)
Glucose, Bld: 88 mg/dL (ref 70–99)
Potassium: 3.9 mEq/L (ref 3.5–5.1)
Sodium: 141 mEq/L (ref 135–145)
Total Bilirubin: 0.6 mg/dL (ref 0.2–1.2)
Total Protein: 6.7 g/dL (ref 6.0–8.3)

## 2023-06-03 LAB — HEMOGLOBIN A1C: Hgb A1c MFr Bld: 5.6 % (ref 4.6–6.5)

## 2023-06-03 LAB — TSH: TSH: 1.19 u[IU]/mL (ref 0.35–5.50)

## 2023-06-03 LAB — VITAMIN D 25 HYDROXY (VIT D DEFICIENCY, FRACTURES): VITD: 39.56 ng/mL (ref 30.00–100.00)

## 2023-06-03 LAB — LDL CHOLESTEROL, DIRECT: Direct LDL: 108 mg/dL

## 2023-06-03 LAB — B12 AND FOLATE PANEL
Folate: >23.8 ng/mL (ref >5.9)
Vitamin B-12: 609 pg/mL (ref 211–911)

## 2023-06-06 ENCOUNTER — Encounter: Payer: Self-pay | Admitting: Internal Medicine

## 2023-06-06 ENCOUNTER — Ambulatory Visit (INDEPENDENT_AMBULATORY_CARE_PROVIDER_SITE_OTHER): Payer: Medicare Other | Admitting: Internal Medicine

## 2023-06-06 VITALS — BP 134/82 | HR 71 | Temp 97.9°F | Ht 63.0 in | Wt 123.2 lb

## 2023-06-06 DIAGNOSIS — G43B0 Ophthalmoplegic migraine, not intractable: Secondary | ICD-10-CM | POA: Diagnosis not present

## 2023-06-06 DIAGNOSIS — M8580 Other specified disorders of bone density and structure, unspecified site: Secondary | ICD-10-CM | POA: Diagnosis not present

## 2023-06-06 DIAGNOSIS — G43109 Migraine with aura, not intractable, without status migrainosus: Secondary | ICD-10-CM | POA: Diagnosis not present

## 2023-06-06 DIAGNOSIS — E782 Mixed hyperlipidemia: Secondary | ICD-10-CM | POA: Diagnosis not present

## 2023-06-06 DIAGNOSIS — E034 Atrophy of thyroid (acquired): Secondary | ICD-10-CM | POA: Diagnosis not present

## 2023-06-06 DIAGNOSIS — R4589 Other symptoms and signs involving emotional state: Secondary | ICD-10-CM | POA: Diagnosis not present

## 2023-06-06 MED ORDER — SIMVASTATIN 40 MG PO TABS: 40.0000 mg | ORAL_TABLET | Freq: Every day | ORAL | 3 refills | Status: DC

## 2023-06-06 MED ORDER — LEVOTHYROXINE SODIUM 75 MCG PO TABS: 75.0000 ug | ORAL_TABLET | Freq: Every day | ORAL | 1 refills | Status: DC

## 2023-06-06 NOTE — Patient Instructions (Addendum)
  I highly recommend reading "the End of Alzheimer's: The First Program to Prevent and Reverse Cognitive Decline"  by Dorice Lamas, MD     Calcium supplements:  look for "algae cal"

## 2023-06-06 NOTE — Assessment & Plan Note (Signed)
Recent epsiode was an ocular migraine with no headache

## 2023-06-06 NOTE — Assessment & Plan Note (Signed)
She reports feeling more calm and confident about her health after attending a seminar by Dr Sherryll Burger.

## 2023-06-06 NOTE — Progress Notes (Signed)
Subjective:  Patient ID: Raven [name], female    DOB: 03/21/1941  Age: 82 y.o. MRN: [id number]  CC: The primary encounter diagnosis was Hypothyroidism due to acquired atrophy of thyroid. Diagnoses of Osteopenia, unspecified location, Hypercalcemia, Mixed hyperlipidemia, Ocular migraine, Ophthalmoplegic migraine, not intractable, and Anxiety about health were also pertinent to this visit.   HPI MELYSSA SIGNOR presents for  Chief Complaint  Patient presents with   Medical Management of Chronic Issues   Had an ocular migraine last weekend:  occurred while driving  back from church.   Flashing lights and squiggly lines.  Lasted  < 1 hour.  Was resolved by the time she spoke with the on call ophthalmologist dr Haskel Khan. Who reassured her that she was not having a retinal detachment.    First event.    Anxiety about health:  she attended a seminar at [facility] led by Dr Sherryll Burger.  Feels much better about aging and  how to live the rest of her life  .  Attending Tai Chi and Yoga.    Walking several days per week.   Outpatient Medications Prior to Visit  Medication Sig Dispense Refill   aspirin-acetaminophen-caffeine (EXCEDRIN MIGRAINE) 250-250-65 MG per tablet Take 1 tablet by mouth every 6 (six) hours as needed for headache.     calcium-vitamin D (OSCAL WITH D) 500-200 MG-UNIT per tablet Take 1 tablet by mouth daily with breakfast.     diclofenac Sodium (VOLTAREN) 1 % GEL Apply 4 g topically 4 (four) times daily. 50 g 3   Multiple Vitamin (MULTIVITAMIN WITH MINERALS) TABS tablet Take 1 tablet by mouth daily.     Biotin 2500 MCG CAPS Take by mouth.     ibuprofen (ADVIL,MOTRIN) 200 MG tablet Take 200-400 mg by mouth every 6 (six) hours as needed for mild pain or moderate pain.     levothyroxine (SYNTHROID) 75 MCG tablet Take 1 tablet (75 mcg total) by mouth daily before breakfast. 90 tablet 1   simvastatin (ZOCOR) 40 MG tablet TAKE 1 TABLET BY MOUTH AT BEDTIME 90 tablet 1   No  facility-administered medications prior to visit.    Review of Systems;  Patient denies headache, fevers, malaise, unintentional weight loss, skin rash, eye pain, sinus congestion and sinus pain, sore throat, dysphagia,  hemoptysis , cough, dyspnea, wheezing, chest pain, palpitations, orthopnea, edema, abdominal pain, nausea, melena, diarrhea, constipation, flank pain, dysuria, hematuria, urinary  Frequency, nocturia, numbness, tingling, seizures,  Focal weakness, Loss of consciousness,  Tremor, insomnia, depression, anxiety, and suicidal ideation.      Objective:  BP 134/82   Pulse 71   Temp 97.9 F (36.6 C)   Ht 5\' 3"  (1.6 m)   Wt 123 lb 3.2 oz (55.9 kg)   SpO2 98%   BMI 21.82 kg/m   BP Readings from Last 3 Encounters:  06/06/23 134/82  05/09/23 (!) 145/80  02/21/23 118/72    Wt Readings from Last 3 Encounters:  06/06/23 123 lb 3.2 oz (55.9 kg)  02/21/23 124 lb 3.2 oz (56.3 kg)  08/01/22 124 lb (56.2 kg)    Physical Exam Vitals reviewed.  Constitutional:      General: She is not in acute distress.    Appearance: Normal appearance. She is normal weight. She is not ill-appearing, toxic-appearing or diaphoretic.  HENT:     Head: Normocephalic.  Eyes:     General: No scleral icterus.       Right eye: No discharge.  Left eye: No discharge.     Conjunctiva/sclera: Conjunctivae normal.  Cardiovascular:     Rate and Rhythm: Normal rate and regular rhythm.     Heart sounds: Normal heart sounds.  Pulmonary:     Effort: Pulmonary effort is normal. No respiratory distress.     Breath sounds: Normal breath sounds.  Musculoskeletal:        General: Normal range of motion.  Skin:    General: Skin is warm and dry.  Neurological:     General: No focal deficit present.     Mental Status: She is alert and oriented to person, place, and time. Mental status is at baseline.  Psychiatric:        Mood and Affect: Mood normal.        Behavior: Behavior normal.         Thought Content: Thought content normal.        Judgment: Judgment normal.    Lab Results  Component Value Date   HGBA1C 5.6 06/03/2023   HGBA1C 5.7 02/07/2023   HGBA1C 5.7 02/16/2022    Lab Results  Component Value Date   CREATININE 0.78 06/03/2023   CREATININE 0.82 02/07/2023   CREATININE 0.78 02/16/2022    Lab Results  Component Value Date   WBC 4.9 06/03/2023   HGB 12.7 06/03/2023   HCT 38.5 06/03/2023   PLT 243.0 06/03/2023   GLUCOSE 88 06/03/2023   CHOL 212 (H) 06/03/2023   TRIG 76.0 06/03/2023   HDL 74.30 06/03/2023   LDLDIRECT 108.0 06/03/2023   LDLCALC 122 (H) 06/03/2023   ALT 15 06/03/2023   AST 22 06/03/2023   NA 141 06/03/2023   K 3.9 06/03/2023   CL 102 06/03/2023   CREATININE 0.78 06/03/2023   BUN 19 06/03/2023   CO2 31 06/03/2023   TSH 1.19 06/03/2023   INR 1.0 01/19/2015   HGBA1C 5.6 06/03/2023   MICROALBUR <0.7 07/09/2019    US SOFT TISSUE RT UPPER EXTREMITY LTD (NON-VASCULAR)  Result Date: 08/08/2022 CLINICAL DATA:  Right axillary lump for 6 weeks EXAM: ULTRASOUND RIGHT UPPER EXTREMITY LIMITED TECHNIQUE: Ultrasound examination of the upper extremity soft tissues was performed in the area of clinical concern. COMPARISON:  None Available. FINDINGS: Targeted ultrasound was performed of the soft tissues of the right axilla at site of patient's clinical concern. There is a well-defined ovoid soft tissue mass within the superficial subcutaneous soft tissues measuring 1.3 x 0.4 x 0.7 cm. No internal vascularity. Mass is isoechoic to background fat. No posterior acoustic features. No surrounding soft tissue edema or hyperemia. IMPRESSION: Nonvascular 1.3 cm superficial subcutaneous soft tissue mass in the right axilla most compatible with a lipoma. Electronically Signed   By: Duanne Guess D.O.   On: 08/08/2022 09:27    Assessment & Plan:  .Hypothyroidism due to acquired atrophy of thyroid Assessment & Plan: Thyroid function is WNL on current dose of  75 mcg daily    Lab Results  Component Value Date   TSH 1.19 06/03/2023      Osteopenia, unspecified location  Hypercalcemia  Mixed hyperlipidemia Assessment & Plan: Continue primary prevention with simvastatin given presence of renal artery and carotid artery stenoses .  / Lab Results  Component Value Date   CHOL 212 (H) 06/03/2023   HDL 74.30 06/03/2023   LDLCALC 122 (H) 06/03/2023   LDLDIRECT 108.0 06/03/2023   TRIG 76.0 06/03/2023   CHOLHDL 3 06/03/2023      Ocular migraine Assessment & Plan: First episode occurred  in July 2024 . May have been triggered by sunlight    Ophthalmoplegic migraine, not intractable Assessment & Plan: Recent epsiode was an ocular migraine with no headache    Anxiety about health Assessment & Plan: She reports feeling more calm and confident about her health after attending a seminar by Dr Sherryll Burger.     Other orders -     Levothyroxine Sodium; Take 1 tablet (75 mcg total) by mouth daily before breakfast.  Dispense: 90 tablet; Refill: 1 -     Simvastatin; Take 1 tablet (40 mg total) by mouth at bedtime.  Dispense: 90 tablet; Refill: 3     I provided 38 minutes of face-to-face time during this encounter reviewing patient's last visit with me, patient's   prior surgical and non surgical procedures, previous  labs and imaging studies, counseling on currently addressed issues,  and post visit ordering to diagnostics and therapeutics .   Follow-up: Return in about 1 year (around 06/05/2024).   Sherlene Shams, MD

## 2023-06-06 NOTE — Assessment & Plan Note (Signed)
Continue primary prevention with simvastatin given presence of renal artery and carotid artery stenoses .  / Lab Results  Component Value Date   CHOL 212 (H) 06/03/2023   HDL 74.30 06/03/2023   LDLCALC 122 (H) 06/03/2023   LDLDIRECT 108.0 06/03/2023   TRIG 76.0 06/03/2023   CHOLHDL 3 06/03/2023

## 2023-06-06 NOTE — Assessment & Plan Note (Signed)
Thyroid function is WNL on current dose of 75 mcg daily    Lab Results  Component Value Date   TSH 1.19 06/03/2023

## 2023-06-06 NOTE — Assessment & Plan Note (Signed)
First episode occurred in July 2024 . May have been triggered by sunlight

## 2023-07-16 ENCOUNTER — Ambulatory Visit
Admission: RE | Admit: 2023-07-16 | Discharge: 2023-07-16 | Disposition: A | Discharge status: Home / Self Care | Payer: Medicare Other | Source: Ambulatory Visit | Attending: Internal Medicine | Admitting: Internal Medicine

## 2023-07-16 DIAGNOSIS — Z1231 Encounter for screening mammogram for malignant neoplasm of breast: Secondary | ICD-10-CM

## 2023-07-16 DIAGNOSIS — M810 Age-related osteoporosis without current pathological fracture: Secondary | ICD-10-CM | POA: Insufficient documentation

## 2023-07-16 DIAGNOSIS — Z780 Asymptomatic menopausal state: Secondary | ICD-10-CM | POA: Diagnosis not present

## 2023-08-01 DIAGNOSIS — Z6822 Body mass index (BMI) 22.0-22.9, adult: Secondary | ICD-10-CM | POA: Diagnosis not present

## 2023-08-01 DIAGNOSIS — R519 Headache, unspecified: Secondary | ICD-10-CM | POA: Diagnosis not present

## 2023-08-23 DIAGNOSIS — Z23 Encounter for immunization: Secondary | ICD-10-CM | POA: Diagnosis not present

## 2023-09-04 DIAGNOSIS — Z961 Presence of intraocular lens: Secondary | ICD-10-CM | POA: Diagnosis not present

## 2023-09-04 DIAGNOSIS — H04123 Dry eye syndrome of bilateral lacrimal glands: Secondary | ICD-10-CM | POA: Diagnosis not present

## 2023-09-04 DIAGNOSIS — H5212 Myopia, left eye: Secondary | ICD-10-CM | POA: Diagnosis not present

## 2023-09-12 ENCOUNTER — Encounter: Payer: Self-pay | Admitting: Dermatology

## 2023-09-12 ENCOUNTER — Ambulatory Visit: Payer: Medicare Other | Admitting: Dermatology

## 2023-09-12 VITALS — BP 141/71

## 2023-09-12 DIAGNOSIS — W908XXA Exposure to other nonionizing radiation, initial encounter: Secondary | ICD-10-CM

## 2023-09-12 DIAGNOSIS — L821 Other seborrheic keratosis: Secondary | ICD-10-CM | POA: Diagnosis not present

## 2023-09-12 DIAGNOSIS — L570 Actinic keratosis: Secondary | ICD-10-CM

## 2023-09-12 DIAGNOSIS — L578 Other skin changes due to chronic exposure to nonionizing radiation: Secondary | ICD-10-CM

## 2023-09-12 DIAGNOSIS — L814 Other melanin hyperpigmentation: Secondary | ICD-10-CM

## 2023-09-12 NOTE — Progress Notes (Signed)
   Follow-Up Visit   Subjective  Raven [name] is a 82 y.o. female who presents for the following: AK 69m f/u R upper lip, L nose, L zygoma The patient has spots, moles and lesions to be evaluated, some may be new or changing and the patient may have concern these could be cancer.   The following portions of the chart were reviewed this encounter and updated as appropriate: medications, allergies, medical history  Review of Systems:  No other skin or systemic complaints except as noted in HPI or Assessment and Plan.  Objective  Well appearing patient in no apparent distress; mood and affect are within normal limits.  A focused examination was performed of the following areas: face  Relevant exam findings are noted in the Assessment and Plan.  R upper lip x 1, R jaw x 1, nose x 1 (3) Pink scaly macules    Assessment & Plan   ACTINIC DAMAGE - chronic, secondary to cumulative UV radiation exposure/sun exposure over time - diffuse scaly erythematous macules with underlying dyspigmentation - Recommend daily broad spectrum sunscreen SPF 30+ to sun-exposed areas, reapply every 2 hours as needed.  - Recommend staying in the shade or wearing long sleeves, sun glasses (UVA+UVB protection) and wide brim hats (4-inch brim around the entire circumference of the hat). - Call for new or changing lesions.   LENTIGINES Exam: scattered tan macules Due to sun exposure Treatment Plan: Benign-appearing, observe. Recommend daily broad spectrum sunscreen SPF 30+ to sun-exposed areas, reapply every 2 hours as needed.  Call for any changes  SEBORRHEIC KERATOSIS - Stuck-on, waxy, tan-brown papules and/or plaques  - Benign-appearing - Discussed benign etiology and prognosis. - Observe - Call for any changes  AK (actinic keratosis) (3) R upper lip x 1, R jaw x 1, nose x 1  Actinic keratoses are precancerous spots that appear secondary to cumulative UV radiation exposure/sun exposure over time.  They are chronic with expected duration over 1 year. A portion of actinic keratoses will progress to squamous cell carcinoma of the skin. It is not possible to reliably predict which spots will progress to skin cancer and so treatment is recommended to prevent development of skin cancer.  Recommend daily broad spectrum sunscreen SPF 30+ to sun-exposed areas, reapply every 2 hours as needed.  Recommend staying in the shade or wearing long sleeves, sun glasses (UVA+UVB protection) and wide brim hats (4-inch brim around the entire circumference of the hat). Call for new or changing lesions.  Destruction of lesion - R upper lip x 1, R jaw x 1, nose x 1 (3) Complexity: simple   Destruction method: cryotherapy   Informed consent: discussed and consent obtained   Timeout:  patient name, date of birth, surgical site, and procedure verified Lesion destroyed using liquid nitrogen: Yes   Region frozen until ice ball extended beyond lesion: Yes   Outcome: patient tolerated procedure well with no complications   Post-procedure details: wound care instructions given      Return in about 1 year (around 09/11/2024) for TBSE, Hx of AKs.  I, Ardis Rowan, RMA, am acting as scribe for Armida Sans, MD .   Documentation: I have reviewed the above documentation for accuracy and completeness, and I agree with the above.  Armida Sans, MD

## 2023-09-12 NOTE — Patient Instructions (Addendum)
Cryotherapy Aftercare  Wash gently with soap and water everyday.   Apply Vaseline and Band-Aid daily until healed.    Due to recent changes in healthcare laws, you may see results of your pathology and/or laboratory studies on MyChart before the doctors have had a chance to review them. We understand that in some cases there may be results that are confusing or concerning to you. Please understand that not all results are received at the same time and often the doctors may need to interpret multiple results in order to provide you with the best plan of care or course of treatment. Therefore, we ask that you please give [place] 2 business days to thoroughly review all your results before contacting the office for clarification. Should we see a critical lab result, you will be contacted sooner.   If You Need Anything After Your Visit  If you have any questions or concerns for your doctor, please call our main line at [phone] and press option 4 to reach your doctor's medical assistant. If no one answers, please leave a voicemail as directed and we will return your call as soon as possible. Messages left after 4 pm will be answered the following business day.   You may also send [place] a message via MyChart. We typically respond to MyChart messages within 1-2 business days.  For prescription refills, please ask your pharmacy to contact our office. Our fax number is [phone].  If you have an urgent issue when the clinic is closed that cannot wait until the next business day, you can page your doctor at the number below.    Please note that while we do our best to be available for urgent issues outside of office hours, we are not available 24/7.   If you have an urgent issue and are unable to reach [place], you may choose to seek medical care at your doctor's office, retail clinic, urgent care center, or emergency room.  If you have a medical emergency, please immediately call 911 or go to the emergency  department.  Pager Numbers  - Dr. Gwen Pounds: [phone]  - Dr. Roseanne Reno: [phone]  - Dr. Katrinka Blazing: [phone]   In the event of inclement weather, please call our main line at [phone] for an update on the status of any delays or closures.  Dermatology Medication Tips: Please keep the boxes that topical medications come in in order to help keep track of the instructions about where and how to use these. Pharmacies typically print the medication instructions only on the boxes and not directly on the medication tubes.   If your medication is too expensive, please contact our office at [phone] option 4 or send [place] a message through MyChart.   We are unable to tell what your co-pay for medications will be in advance as this is different depending on your insurance coverage. However, we may be able to find a substitute medication at lower cost or fill out paperwork to get insurance to cover a needed medication.   If a prior authorization is required to get your medication covered by your insurance company, please allow [place] 1-2 business days to complete this process.  Drug prices often vary depending on where the prescription is filled and some pharmacies may offer cheaper prices.  The website www.goodrx.com contains coupons for medications through different pharmacies. The prices here do not account for what the cost may be with help from insurance (it may be cheaper with your insurance), but the website can  give you the price if you did not use any insurance.  - You can print the associated coupon and take it with your prescription to the pharmacy.  - You may also stop by our office during regular business hours and pick up a GoodRx coupon card.  - If you need your prescription sent electronically to a different pharmacy, notify our office through [facility] or by phone at [phone] option 4.     Si Usted Necesita Algo Despus de Su Visita  Tambin puede enviarnos  un mensaje a travs de Clinical cytogeneticist. Por lo general respondemos a los mensajes de MyChart en el transcurso de 1 a 2 das hbiles.  Para renovar recetas, por favor pida a su farmacia que se ponga en contacto con nuestra oficina. Annie Sable de fax es [place] [phone].  Si tiene un asunto urgente cuando la clnica est cerrada y que no puede esperar hasta el siguiente da hbil, puede llamar/localizar a su doctor(a) al nmero que aparece a continuacin.   Por favor, tenga en cuenta que aunque hacemos todo lo posible para estar disponibles para asuntos urgentes fuera del horario de [place], no estamos disponibles las 24 horas del da, los 7 [address]  [address] de la [place].   Si tiene un problema urgente y no puede comunicarse con nosotros, puede optar por buscar atencin mdica  en el consultorio de su doctor(a), en una clnica privada, en un centro de atencin urgente o en una sala de emergencias.  Si tiene Engineer, drilling, por favor llame inmediatamente al 911 o vaya a la sala de emergencias.  Nmeros de bper  - Dr. Gwen Pounds: [phone]  - Dra. Roseanne Reno: [phone]  - Dr. Katrinka Blazing: [phone]   En caso de inclemencias del tiempo, por favor llame a Lacy Duverney principal al [phone] para una actualizacin sobre el [place] de cualquier retraso o cierre.  Consejos para la medicacin en dermatologa: Por favor, guarde las cajas en las que vienen los medicamentos de uso tpico para ayudarle a seguir las instrucciones sobre dnde y cmo usarlos. Las farmacias generalmente imprimen las instrucciones del medicamento slo en las cajas y no directamente en los tubos del [place].   Si su medicamento es muy caro, por favor, pngase en contacto con Rolm Gala llamando al [phone] y presione la opcin 4 o envenos un mensaje a travs de Clinical cytogeneticist.   No podemos decirle cul ser su copago por los medicamentos por adelantado ya que esto es diferente dependiendo de la cobertura de su seguro. Sin  embargo, es posible que podamos encontrar un medicamento sustituto a Audiological scientist un formulario para que el seguro cubra el medicamento que se considera necesario.   Si se requiere una autorizacin previa para que su compaa de seguros [place] su medicamento, por favor permtanos de 1 a 2 das hbiles para completar [address]  Los precios de los medicamentos varan con frecuencia dependiendo del Environmental consultant de dnde se surte la receta y alguna farmacias pueden ofrecer precios ms baratos.  El sitio web www.goodrx.com tiene cupones para medicamentos de Health and safety inspector. Los precios aqu no tienen en cuenta lo que podra costar con la ayuda del seguro (puede ser ms barato con su seguro), pero el sitio web puede darle el precio si no utiliz Tourist information centre manager.  - Puede imprimir el cupn correspondiente y llevarlo con su receta a la farmacia.  - Tambin puede pasar por nuestra oficina durante el horario de atencin regular y Education officer, museum una tarjeta de cupones de GoodRx.  -  Si necesita que su receta se enve electrnicamente a Psychiatrist, informe a nuestra oficina a travs de MyChart de [place] o por telfono llamando al [phone] y presione la opcin 4.

## 2023-09-19 DIAGNOSIS — Z23 Encounter for immunization: Secondary | ICD-10-CM | POA: Diagnosis not present

## 2023-09-28 ENCOUNTER — Encounter: Payer: Self-pay | Admitting: Dermatology

## 2023-11-01 DIAGNOSIS — M170 Bilateral primary osteoarthritis of knee: Secondary | ICD-10-CM | POA: Diagnosis not present

## 2023-12-17 DIAGNOSIS — M25562 Pain in left knee: Secondary | ICD-10-CM | POA: Diagnosis not present

## 2023-12-24 DIAGNOSIS — M25562 Pain in left knee: Secondary | ICD-10-CM | POA: Diagnosis not present

## 2023-12-31 DIAGNOSIS — M25562 Pain in left knee: Secondary | ICD-10-CM | POA: Diagnosis not present

## 2024-01-07 DIAGNOSIS — M25562 Pain in left knee: Secondary | ICD-10-CM | POA: Diagnosis not present

## 2024-01-10 ENCOUNTER — Telehealth: Payer: Self-pay

## 2024-01-10 DIAGNOSIS — E782 Mixed hyperlipidemia: Secondary | ICD-10-CM

## 2024-01-10 DIAGNOSIS — E034 Atrophy of thyroid (acquired): Secondary | ICD-10-CM

## 2024-01-10 NOTE — Addendum Note (Signed)
Addended by: Sandy Salaam on: 01/10/2024 04:36 PM   Modules accepted: Orders

## 2024-01-10 NOTE — Telephone Encounter (Signed)
LMTCB

## 2024-01-10 NOTE — Telephone Encounter (Signed)
Copied from CRM [id number]. Topic: Clinical - Medication Question >> Jan 10, 2024 11:46 AM Sonny Dandy B wrote: Reason for CRM: Pt called to speak with dr. Darrick Huntsman nurse regarding a medication question please give pt a call back at [phone]

## 2024-01-10 NOTE — Telephone Encounter (Signed)
Spoke with Raven [name] to let her know that she does need to keep both the lab appt and her follow up appt with Dr. Darrick Huntsman. I have pended the labs for your approval.

## 2024-01-10 NOTE — Telephone Encounter (Signed)
Copied from CRM [id number]. Topic: Clinical - Medical Advice >> Jan 10, 2024  3:46 PM Raven [name] wrote: Reason for CRM: Patient returning phone call from Shanda Bumps - pt has labs appointment on 2/21, she wants to confirm if she needs to actually see Dr.Tullo for refills of levothyroxine (SYNTHROID) 75 MCG tablet.

## 2024-01-10 NOTE — Telephone Encounter (Signed)
See previous message

## 2024-01-13 ENCOUNTER — Telehealth: Payer: Self-pay | Admitting: Internal Medicine

## 2024-01-13 DIAGNOSIS — E559 Vitamin D deficiency, unspecified: Secondary | ICD-10-CM

## 2024-01-13 DIAGNOSIS — E7849 Other hyperlipidemia: Secondary | ICD-10-CM

## 2024-01-13 DIAGNOSIS — E02 Subclinical iodine-deficiency hypothyroidism: Secondary | ICD-10-CM

## 2024-01-13 NOTE — Telephone Encounter (Signed)
 Patient need lab orders.

## 2024-01-17 ENCOUNTER — Other Ambulatory Visit (INDEPENDENT_AMBULATORY_CARE_PROVIDER_SITE_OTHER): Payer: Medicare Other

## 2024-01-17 DIAGNOSIS — E02 Subclinical iodine-deficiency hypothyroidism: Secondary | ICD-10-CM | POA: Diagnosis not present

## 2024-01-17 DIAGNOSIS — E7849 Other hyperlipidemia: Secondary | ICD-10-CM | POA: Diagnosis not present

## 2024-01-17 DIAGNOSIS — E559 Vitamin D deficiency, unspecified: Secondary | ICD-10-CM

## 2024-01-17 LAB — COMPREHENSIVE METABOLIC PANEL
ALT: 25 U/L (ref 0–35)
AST: 34 U/L (ref 0–37)
Albumin: 4.3 g/dL (ref 3.5–5.2)
Alkaline Phosphatase: 48 U/L (ref 39–117)
BUN: 24 mg/dL — ABNORMAL HIGH (ref 6–23)
CO2: 29 meq/L (ref 19–32)
Calcium: 9.5 mg/dL (ref 8.4–10.5)
Chloride: 103 meq/L (ref 96–112)
Creatinine, Ser: 0.82 mg/dL (ref 0.40–1.20)
GFR: 67.36 mL/min (ref >60.00)
Glucose, Bld: 95 mg/dL (ref 70–99)
Potassium: 3.8 meq/L (ref 3.5–5.1)
Sodium: 139 meq/L (ref 135–145)
Total Bilirubin: 0.6 mg/dL (ref 0.2–1.2)
Total Protein: 7.1 g/dL (ref 6.0–8.3)

## 2024-01-17 LAB — VITAMIN D 25 HYDROXY (VIT D DEFICIENCY, FRACTURES): VITD: 39.53 ng/mL (ref 30.00–100.00)

## 2024-01-17 LAB — TSH: TSH: 2.39 u[IU]/mL (ref 0.35–5.50)

## 2024-01-18 ENCOUNTER — Encounter: Payer: Self-pay | Admitting: Internal Medicine

## 2024-01-18 LAB — LIPID PANEL W/REFLEX DIRECT LDL
Cholesterol: 215 mg/dL — ABNORMAL HIGH (ref <200)
HDL: 90 mg/dL (ref >=50)
LDL Cholesterol (Calc): 110 mg/dL — ABNORMAL HIGH
Non-HDL Cholesterol (Calc): 125 mg/dL (ref <130)
Total CHOL/HDL Ratio: 2.4 (calc) (ref <5.0)
Triglycerides: 63 mg/dL (ref <150)

## 2024-01-29 ENCOUNTER — Encounter: Payer: Self-pay | Admitting: Internal Medicine

## 2024-01-29 ENCOUNTER — Ambulatory Visit: Payer: Federal, State, Local not specified - PPO | Admitting: Internal Medicine

## 2024-01-29 VITALS — BP 110/74 | HR 75 | Temp 98.0°F | Ht 61.75 in | Wt 123.6 lb

## 2024-01-29 DIAGNOSIS — K5900 Constipation, unspecified: Secondary | ICD-10-CM

## 2024-01-29 DIAGNOSIS — E039 Hypothyroidism, unspecified: Secondary | ICD-10-CM

## 2024-01-29 DIAGNOSIS — M150 Primary generalized (osteo)arthritis: Secondary | ICD-10-CM

## 2024-01-29 DIAGNOSIS — R419 Unspecified symptoms and signs involving cognitive functions and awareness: Secondary | ICD-10-CM

## 2024-01-29 DIAGNOSIS — M1990 Unspecified osteoarthritis, unspecified site: Secondary | ICD-10-CM | POA: Insufficient documentation

## 2024-01-29 MED ORDER — LEVOTHYROXINE SODIUM 75 MCG PO TABS: 75.0000 ug | ORAL_TABLET | Freq: Every day | ORAL | 1 refills | Status: DC

## 2024-01-29 NOTE — Patient Instructions (Addendum)
 For your joint pain :  OsteoBiFlex is perfectly safe to try as a daily supplement for joint pain  You can add up to 2000 mg of acetominophen (tylenol) every day safely  In divided doses (500 mg every 6 hours  Or 1000 mg every 12 hours.)   Use Aleve sparingly (not daily ,  every other day at the most)  You might want to try using Relaxium for insomnia  (as seen on TV commercials) . It is available through [employer] and contains all natural supplements:  Melatonin 5 mg  Chamomile 25 mg Passionflower extract 75 mg GABA 100 mg Ashwaganda extract 125 mg Magnesium citrate, glycinate, oxide (100 mg)  L tryptophan 500 mg Valerest (proprietary  ingredient ; probably valeria root extract)    Regarding your constipation:   You need  48 to 64 ounces of water daily for healthy bowels Ok to use benefiber on a daily basis  Ok to use colace daily as well (docusate)     I highly recommend reading "the End of Alzheimer's: The First Program to Prevent and Reverse Cognitive Decline"  by Dorice Lamas, MD   I have made a referral to Dr Sherryll Burger for a thorough workup of your cognitive concerns

## 2024-01-29 NOTE — Assessment & Plan Note (Signed)
 Thyroid function is WNL on current dose of 75 mcg daily    Lab Results  Component Value Date   TSH 2.39 01/17/2024

## 2024-01-29 NOTE — Assessment & Plan Note (Signed)
 DISCUSSED PHARMACOLOGIC AND NONPHARMACOLGIC METHODS OF MANAGEMENT   , INCLUDNG TYLENOL and judicious use of NSAID. Discussed trial of  Glucosamine /chondrotin sulfate

## 2024-01-29 NOTE — Assessment & Plan Note (Addendum)
 Reassurance provided.   highly recommended  reading "the End of Alzheimer's: The First Program to Prevent and Reverse Cognitive Decline"  by Raven Lamas, MD

## 2024-01-29 NOTE — Progress Notes (Signed)
 Subjective:  Patient ID: Raven [name], female    DOB: Nov 01, 1941  Age: 83 y.o. MRN: [id number]  CC: The primary encounter diagnosis was Cognitive complaints with normal exam. Diagnoses of Acquired hypothyroidism, Constipation, unspecified constipation type, Primary osteoarthritis involving multiple joints, and Cognitive complaints with normal neuropsychological exam were also pertinent to this visit.   HPI Raven [name] presents for  Chief Complaint  Patient presents with   Medication Refill   1) HLD:  taking simvastatin 40 mg   2) Hypothyroid taking levothyroxine 75 mcg   3) OA knees bilaterally: saw Deeann Saint in Dec 2024;  mobic 7.5 mg prescribed a/w/a knee brace   4) some anxiety since the election ,  worried for her grandchildren's future.   5) ALtered bowel :  has constipation 3 days per week.  Passes small hard bowels  Outpatient Medications Prior to Visit  Medication Sig Dispense Refill   aspirin-acetaminophen-caffeine (EXCEDRIN MIGRAINE) 250-250-65 MG per tablet Take 1 tablet by mouth every 6 (six) hours as needed for headache.     calcium-vitamin D (OSCAL WITH D) 500-200 MG-UNIT per tablet Take 1 tablet by mouth daily with breakfast.     diclofenac Sodium (VOLTAREN) 1 % GEL Apply 4 g topically 4 (four) times daily. 50 g 3   Multiple Vitamin (MULTIVITAMIN WITH MINERALS) TABS tablet Take 1 tablet by mouth daily.     simvastatin (ZOCOR) 40 MG tablet Take 1 tablet (40 mg total) by mouth at bedtime. 90 tablet 3   levothyroxine (SYNTHROID) 75 MCG tablet Take 1 tablet (75 mcg total) by mouth daily before breakfast. 90 tablet 1   No facility-administered medications prior to visit.    Review of Systems;  Patient denies headache, fevers, malaise, unintentional weight loss, skin rash, eye pain, sinus congestion and sinus pain, sore throat, dysphagia,  hemoptysis , cough, dyspnea, wheezing, chest pain, palpitations, orthopnea, edema, abdominal pain, nausea, melena, diarrhea,  constipation, flank pain, dysuria, hematuria, urinary  Frequency, nocturia, numbness, tingling, seizures,  Focal weakness, Loss of consciousness,  Tremor, insomnia, depression, anxiety, and suicidal ideation.      Objective:  BP 110/74   Pulse 75   Temp 98 F (36.7 C) (Oral)   Ht 5' 1.75" (1.568 m)   Wt 123 lb 9.6 oz (56.1 kg)   SpO2 99%   BMI 22.79 kg/m   BP Readings from Last 3 Encounters:  01/29/24 110/74  09/12/23 (!) 141/71  06/06/23 134/82    Wt Readings from Last 3 Encounters:  01/29/24 123 lb 9.6 oz (56.1 kg)  06/06/23 123 lb 3.2 oz (55.9 kg)  02/21/23 124 lb 3.2 oz (56.3 kg)    Physical Exam Vitals reviewed.  Constitutional:      General: She is not in acute distress.    Appearance: Normal appearance. She is normal weight. She is not ill-appearing, toxic-appearing or diaphoretic.  HENT:     Head: Normocephalic.  Eyes:     General: No scleral icterus.       Right eye: No discharge.        Left eye: No discharge.     Conjunctiva/sclera: Conjunctivae normal.  Cardiovascular:     Rate and Rhythm: Normal rate and regular rhythm.     Heart sounds: Normal heart sounds.  Pulmonary:     Effort: Pulmonary effort is normal. No respiratory distress.     Breath sounds: Normal breath sounds.  Musculoskeletal:        General: Normal range of motion.  Skin:    General: Skin is warm and dry.     Findings: Rash present.  Neurological:     General: No focal deficit present.     Mental Status: She is alert and oriented to person, place, and time. Mental status is at baseline.     Comments: MMSE :  30/30  Psychiatric:        Mood and Affect: Mood normal.        Behavior: Behavior normal.        Thought Content: Thought content normal.        Judgment: Judgment normal.    Lab Results  Component Value Date   HGBA1C 5.6 06/03/2023   HGBA1C 5.7 02/07/2023   HGBA1C 5.7 02/16/2022    Lab Results  Component Value Date   CREATININE 0.82 01/17/2024   CREATININE 0.78  06/03/2023   CREATININE 0.82 02/07/2023    Lab Results  Component Value Date   WBC 4.9 06/03/2023   HGB 12.7 06/03/2023   HCT 38.5 06/03/2023   PLT 243.0 06/03/2023   GLUCOSE 95 01/17/2024   CHOL 215 (H) 01/17/2024   TRIG 63 01/17/2024   HDL 90 01/17/2024   LDLDIRECT 108.0 06/03/2023   LDLCALC 110 (H) 01/17/2024   ALT 25 01/17/2024   AST 34 01/17/2024   NA 139 01/17/2024   K 3.8 01/17/2024   CL 103 01/17/2024   CREATININE 0.82 01/17/2024   BUN 24 (H) 01/17/2024   CO2 29 01/17/2024   TSH 2.39 01/17/2024   INR 1.0 01/19/2015   HGBA1C 5.6 06/03/2023   MICROALBUR <0.7 07/09/2019    MM 3D SCREENING MAMMOGRAM BILATERAL BREAST Result Date: 07/17/2023 CLINICAL DATA:  Screening. EXAM: DIGITAL SCREENING BILATERAL MAMMOGRAM WITH TOMOSYNTHESIS AND CAD TECHNIQUE: Bilateral screening digital craniocaudal and mediolateral oblique mammograms were obtained. Bilateral screening digital breast tomosynthesis was performed. The images were evaluated with computer-aided detection. COMPARISON:  Previous exam(s). ACR Breast Density Category d: The breasts are extremely dense, which lowers the sensitivity of mammography. FINDINGS: There are no findings suspicious for malignancy. IMPRESSION: No mammographic evidence of malignancy. A result letter of this screening mammogram will be mailed directly to the patient. RECOMMENDATION: Screening mammogram in one year. (Code:SM-B-01Y) BI-RADS CATEGORY  1: Negative. Electronically Signed   By: Edwin Cap M.D.   On: 07/17/2023 12:59   DG Bone Density Result Date: 07/16/2023 EXAM: DUAL X-RAY ABSORPTIOMETRY (DXA) FOR BONE MINERAL DENSITY IMPRESSION: Dear Dr Darrick Huntsman, Your patient Raven [name] completed a FRAX assessment on 07/16/2023 using the Lunar iDXA DXA System (analysis version: 14.10) manufactured by [employer]. The following summarizes the results of our evaluation. PATIENT BIOGRAPHICAL: Name: Raven [name] Patient ID: [id number] Birth Date: 11-23-1941  Height:    63.0 in. Gender:     Female    [age]       Weight:    123.2 lbs. Ethnicity:  White                            Exam Date: 07/16/2023 FRAX* RESULTS:  (version: 3.5) 10-year Probability of Fracture1 Major Osteoporotic Fracture2 Hip Fracture 21.8% 6.5% Population: [place] (Caucasian) Risk Factors: History of Fracture (Adult) Based on DualFemur (Left) Neck BMD 1 -The 10-year probability of fracture may be lower than reported if the patient has received treatment. 2 -Major Osteoporotic Fracture: Clinical Spine, Forearm, Hip or Shoulder *FRAX is a Armed forces logistics/support/administrative officer of the [employer] of  [employer], a World Science writer (WHO) Collaborating Centre. ASSESSMENT: The probability of a major osteoporotic fracture is 21.8% within the next ten years. The probability of a hip fracture is 6.5% within the next ten years. Your patient Bryna Razavi completed a BMD test on 07/16/2023 using the [employer] DXA System (software version: 14.10) manufactured by [employer]. The following summarizes the results of our evaluation. Technologist: MTB PATIENT BIOGRAPHICAL: Name: Deadra, Diggins Patient ID: [id number] Birth Date: 03/02/1941 Height: 63.0 in. Gender: Female Exam Date: 07/16/2023 Weight: 123.2 lbs. Indications: Postmenopausal, Hypothyroid, Caucasian, Advanced Age, History of Fracture (Adult) Fractures: Treatments: Calcium, Vitamin D DENSITOMETRY RESULTS: Site         Region     Measured Date Measured Age WHO Classification Young Adult T-score BMD         %Change vs. Previous Significant Change (*) DualFemur Neck Left 07/16/2023 80.3 Osteopenia -2.2 0.738 g/cm2 0.7% - DualFemur Neck Left 04/14/2020 77.0 Osteopenia -2.2 0.733 g/cm2 -10.1% Yes DualFemur Neck Left 06/30/2014 71.2 Osteopenia -1.6 0.815 g/cm2 0.9% - DualFemur Neck Left 07/26/2009 66.3 Osteopenia -1.7 0.808 g/cm2 -5.3% - DualFemur Neck Left 06/24/2007 64.2 Osteopenia -1.3 0.853 g/cm2 - - DualFemur  Total Mean 07/16/2023 80.3 Osteopenia -1.8 0.785 g/cm2 0.5% - DualFemur Total Mean 04/14/2020 77.0 Osteopenia -1.8 0.781 g/cm2 -4.3% Yes DualFemur Total Mean 06/30/2014 71.2 Osteopenia -1.5 0.816 g/cm2 -1.8% - DualFemur Total Mean 07/26/2009 66.3 Osteopenia -1.4 0.831 g/cm2 -4.6% Yes DualFemur Total Mean 06/24/2007 64.2 Osteopenia -1.1 0.871 g/cm2 - - Left Forearm Radius 33% 07/16/2023 80.3 Osteoporosis -2.7 0.638 g/cm2 -2.9% - Left Forearm Radius 33% 04/14/2020 77.0 Osteoporosis -2.5 0.657 g/cm2 2.3% - Left Forearm Radius 33% 06/30/2014 71.2 Osteoporosis -2.7 0.642 g/cm2 -2.0% - Left Forearm Radius 33% 07/26/2009 66.3 Osteoporosis -2.5 0.655 g/cm2 -5.8% Yes Left Forearm Radius 33% 06/24/2007 64.2 Osteopenia -2.1 0.695 g/cm2 - - ASSESSMENT: The BMD measured at Forearm Radius 33% is 0.638 g/cm2 with a T-score of -2.7. This patient is considered osteoporotic according to World Health Organization [facility]) criteria. The scan quality is good. Lumbar spine was not utilized due to advanced degenerative changes. Compared with prior study, there has been no significant change in the total hip. World Science writer [facility]) criteria for post-menopausal, Caucasian Women: Normal:                   T-score at or above -1 SD Osteopenia/low bone mass: T-score between -1 and -2.5 SD Osteoporosis:             T-score at or below -2.5 SD RECOMMENDATIONS: 1. All patients should optimize calcium and vitamin D intake. 2. Consider FDA-approved medical therapies in postmenopausal women and men aged 21 years and older, based on the following: a. A hip or vertebral(clinical or morphometric) fracture b. T-score < -2.5 at the femoral neck or spine after appropriate evaluation to exclude secondary causes c. Low bone mass (T-score between -1.0 and -2.5 at the femoral neck or spine) and a 10-year probability of a hip fracture > 3% or a 10-year probability of a major osteoporosis-related fracture > 20% based on the [place]-adapted WHO algorithm 3.  Clinician judgment and/or patient preferences may indicate treatment for people with 10-year fracture probabilities above or below these levels FOLLOW-UP: People with diagnosed cases of osteoporosis or at high risk for fracture should have regular bone mineral density tests. For patients eligible for Medicare, routine testing is allowed once every 2 years. The testing frequency  can be increased to one year for patients who have rapidly progressing disease, those who are receiving or discontinuing medical therapy to restore bone mass, or have additional risk factors. I have reviewed this report, and agree with the above findings. [facility] Electronically Signed   By: Frederico Hamman M.D.   On: 07/16/2023 10:43    Assessment & Plan:  .Cognitive complaints with normal exam -     Ambulatory referral to Neurology  Acquired hypothyroidism Assessment & Plan: Thyroid function is WNL on current dose of 75 mcg daily    Lab Results  Component Value Date   TSH 2.39 01/17/2024      Constipation, unspecified constipation type Assessment & Plan: Advised to add  a daily bulk forming laxative (Citrucel, Metamucil, Benefiber, or Fibercon)    Primary osteoarthritis involving multiple joints Assessment & Plan: DISCUSSED PHARMACOLOGIC AND NONPHARMACOLGIC METHODS OF MANAGEMENT   , INCLUDNG TYLENOL and judicious use of NSAID. Discussed trial of  Glucosamine /chondrotin sulfate    Cognitive complaints with normal neuropsychological exam Assessment & Plan: Reassurance provided.   highly recommended  reading "the End of Alzheimer's: The First Program to Prevent and Reverse Cognitive Decline"  by Dorice Lamas, MD     Other orders -     Levothyroxine Sodium; Take 1 tablet (75 mcg total) by mouth daily before breakfast.  Dispense: 90 tablet; Refill: 1     I spent 34 minutes on the day of this face to face encounter reviewing patient's  most recent visit with Orthopedics ,    prior  relevant surgical and non surgical procedures, recent  labs and imaging studies, counseling on memory ,  reviewing the assessment and plan with patient, and post visit ordering and reviewing of  diagnostics and therapeutics with patient  .   Follow-up: Return in about 6 months (around 07/31/2024).   Sherlene Shams, MD

## 2024-01-29 NOTE — Assessment & Plan Note (Signed)
Advised to add  a daily bulk forming laxative (Citrucel, Metamucil, Benefiber, or Fibercon)

## 2024-02-07 DIAGNOSIS — M170 Bilateral primary osteoarthritis of knee: Secondary | ICD-10-CM | POA: Diagnosis not present

## 2024-02-26 DIAGNOSIS — Z1152 Encounter for screening for COVID-19: Secondary | ICD-10-CM | POA: Diagnosis not present

## 2024-02-26 DIAGNOSIS — J309 Allergic rhinitis, unspecified: Secondary | ICD-10-CM | POA: Diagnosis not present

## 2024-02-26 DIAGNOSIS — Z6822 Body mass index (BMI) 22.0-22.9, adult: Secondary | ICD-10-CM | POA: Diagnosis not present

## 2024-02-26 DIAGNOSIS — R5383 Other fatigue: Secondary | ICD-10-CM | POA: Diagnosis not present

## 2024-02-26 DIAGNOSIS — R051 Acute cough: Secondary | ICD-10-CM | POA: Diagnosis not present

## 2024-02-27 ENCOUNTER — Encounter: Payer: Self-pay | Admitting: Internal Medicine

## 2024-03-04 ENCOUNTER — Encounter: Payer: Self-pay | Admitting: Internal Medicine

## 2024-03-04 MED ORDER — PREDNISONE 10 MG PO TABS: ORAL_TABLET | ORAL | 0 refills | Status: DC

## 2024-03-04 MED ORDER — LEVOFLOXACIN 500 MG PO TABS: 500.0000 mg | ORAL_TABLET | Freq: Every day | ORAL | 0 refills | Status: AC

## 2024-06-03 DIAGNOSIS — H903 Sensorineural hearing loss, bilateral: Secondary | ICD-10-CM | POA: Diagnosis not present

## 2024-06-05 ENCOUNTER — Encounter: Payer: Self-pay | Admitting: Internal Medicine

## 2024-06-05 ENCOUNTER — Ambulatory Visit: Payer: Federal, State, Local not specified - PPO | Admitting: Internal Medicine

## 2024-06-05 ENCOUNTER — Ambulatory Visit (INDEPENDENT_AMBULATORY_CARE_PROVIDER_SITE_OTHER): Payer: Federal, State, Local not specified - PPO | Admitting: Internal Medicine

## 2024-06-05 VITALS — BP 130/70 | HR 66 | Temp 97.6°F | Resp 20 | Ht 62.0 in | Wt 123.5 lb

## 2024-06-05 DIAGNOSIS — L659 Nonscarring hair loss, unspecified: Secondary | ICD-10-CM

## 2024-06-05 DIAGNOSIS — R030 Elevated blood-pressure reading, without diagnosis of hypertension: Secondary | ICD-10-CM

## 2024-06-05 DIAGNOSIS — M8580 Other specified disorders of bone density and structure, unspecified site: Secondary | ICD-10-CM | POA: Diagnosis not present

## 2024-06-05 DIAGNOSIS — E782 Mixed hyperlipidemia: Secondary | ICD-10-CM

## 2024-06-05 DIAGNOSIS — Z87898 Personal history of other specified conditions: Secondary | ICD-10-CM | POA: Diagnosis not present

## 2024-06-05 DIAGNOSIS — E034 Atrophy of thyroid (acquired): Secondary | ICD-10-CM | POA: Diagnosis not present

## 2024-06-05 DIAGNOSIS — L649 Androgenic alopecia, unspecified: Secondary | ICD-10-CM

## 2024-06-05 DIAGNOSIS — I951 Orthostatic hypotension: Secondary | ICD-10-CM | POA: Insufficient documentation

## 2024-06-05 DIAGNOSIS — E559 Vitamin D deficiency, unspecified: Secondary | ICD-10-CM | POA: Diagnosis not present

## 2024-06-05 DIAGNOSIS — I6529 Occlusion and stenosis of unspecified carotid artery: Secondary | ICD-10-CM

## 2024-06-05 MED ORDER — SIMVASTATIN 40 MG PO TABS: 40.0000 mg | ORAL_TABLET | Freq: Every day | ORAL | 3 refills | Status: AC

## 2024-06-05 MED ORDER — LEVOTHYROXINE SODIUM 75 MCG PO TABS: 75.0000 ug | ORAL_TABLET | Freq: Every day | ORAL | 1 refills | Status: DC

## 2024-06-05 NOTE — Assessment & Plan Note (Signed)
 Noted with  a drop in systolic of 19 pts and no change in pulse

## 2024-06-05 NOTE — Progress Notes (Unsigned)
 Patient ID: Raven [name], female    DOB: [birth year]  Age: 83 y.o. MRN: [id number]  The patient is here for follow up and management of other chronic and acute problems.   The risk factors are reflected in the social history.   The roster of all physicians providing medical care to patient - is listed in the Snapshot section of the chart.   Activities of daily living:  The patient is 100% independent in all ADLs: dressing, toileting, feeding as well as independent mobility   Home safety : The patient has smoke detectors in the home. They wear seatbelts.  There are no unsecured firearms at home. There is no violence in the home.    There is no risks for hepatitis, STDs or HIV. There is no   history of blood transfusion. They have no travel history to infectious disease endemic areas of the world.   The patient has seen their dentist in the last six month. They have seen their eye doctor in the last year. The patinet  denies slight hearing difficulty with regard to whispered voices and some television programs.  They have deferred audiologic testing in the last year.  They do not  have excessive sun exposure. Discussed the need for sun protection: hats, long sleeves and use of sunscreen if there is significant sun exposure.    Diet: the importance of a healthy diet is discussed. They do have a healthy diet.   The benefits of regular aerobic exercise were discussed. The patient  exercises  3 to 5 days per week  for  60 minutes.    Depression screen: there are no signs or vegative symptoms of depression- irritability, change in appetite, anhedonia, sadness/tearfullness.   The following portions of the patient's history were reviewed and updated as appropriate: allergies, current medications, past family history, past medical history,  past surgical history, past social history  and problem list.   Visual acuity was not assessed per patient preference since the patient has regular follow up with an   ophthalmologist. Hearing and body mass index were assessed and reviewed.    During the course of the visit the patient was educated and counseled about appropriate screening and preventive services including : fall prevention , diabetes screening, nutrition counseling, colorectal cancer screening, and recommended immunizations.    Chief Complaint:  1) hair loss: has been using a shampoo with minoxidil in it for year   2)   seeing [facility] for left knee pain attributed to OA  but the pain is spreading th left hip and left foot.  Stopped taking  meloxicam prescribed by [facility] due to fear   3) had a hearing test and saw Herminio this week  hearing is fine had an episode of vertigo  an hour after the hearing test.   4) had dental films this week  all  fine.   5) LIGHT HEADEDNESS OCCURRING WITH POSITION CHANGE , not vertigo has not fallen because she has good balance    2)  Recent 3 week trip to [place] and [place] on liberation day    Review of Symptoms  Patient denies headache, fevers, malaise, unintentional weight loss, skin rash, eye pain, sinus congestion and sinus pain, sore throat, dysphagia,  hemoptysis , cough, dyspnea, wheezing, chest pain, palpitations, orthopnea, edema, abdominal pain, nausea, melena, diarrhea, constipation, flank pain, dysuria, hematuria, urinary  Frequency, nocturia, numbness, tingling, seizures,  Focal weakness, Loss of consciousness,  Tremor, insomnia, depression, anxiety, and suicidal  ideation.    Physical Exam:  BP 130/70   Pulse 66   Temp 97.6 F (36.4 C)   Resp 20   Ht 5' 2 (1.575 m)   Wt 123 lb 8 oz (56 kg)   SpO2 98%   BMI 22.59 kg/m    Physical Exam  Assessment and Plan: There are no diagnoses linked to this encounter.  No follow-ups on file.  Verneita LITTIE Kettering, MD

## 2024-06-05 NOTE — Patient Instructions (Addendum)
  1) You are DUE for your tetanus-diptheria-pertussis vaccine   (TDaP)   Please get this done at your pharmacy l;  it will be PAID FOR MY MEDICARE ONLY AT YOUR PHARMACY    2) for your arthritis pain:  You can take 3000 mg of acetominophen (tylenol ) every day safely  In divided doses (750 mg  every 6 hours  Or 1000 mg every 8 hours.) as your maintenance medication; then  add the meloxicam if needed  Frequent use of any NSAIDS should warrant  prophylaxis against gastritis by taking omeprazole 20 mg daily   Please schedule an early morning lab visit and suspend your biotin for 3 days prior to lab draw  4) please make sure you are drinking 48 to 60 ounces of water daily  and liberalize your salt intake .  If the light headedness does not improve, let me know because I will have you see cardiology

## 2024-06-07 DIAGNOSIS — L659 Nonscarring hair loss, unspecified: Secondary | ICD-10-CM | POA: Insufficient documentation

## 2024-06-07 DIAGNOSIS — Z87898 Personal history of other specified conditions: Secondary | ICD-10-CM | POA: Insufficient documentation

## 2024-06-07 NOTE — Assessment & Plan Note (Signed)
 Thyroid function is WNL on current dose of 75 mcg daily    Lab Results  Component Value Date   TSH 2.39 01/17/2024

## 2024-06-07 NOTE — Assessment & Plan Note (Signed)
 Recent epiosde occurred after a hearing test.  She has no neurologic deficits and no recent headaches.  No further workup unless episodes recur

## 2024-06-07 NOTE — Assessment & Plan Note (Signed)
 She  has no prior history of hypertension. Her home readings of  blood pressure have been normal.

## 2024-06-07 NOTE — Assessment & Plan Note (Signed)
 There was no progression of mild placque formation by 2017 carotid doppler.  Continued annual  follow up is not necessary, but advised to continue statin therapy

## 2024-06-07 NOTE — Assessment & Plan Note (Addendum)
 Screening labsordered incluidng testosteorne. DHEA, cortisol   Lab Results  Component Value Date   TSH 2.39 01/17/2024  No results found for: TESTOSTERONE

## 2024-06-07 NOTE — Assessment & Plan Note (Signed)
 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  / Lab Results  Component Value Date   CHOL 215 (H) 01/17/2024   HDL 90 01/17/2024   LDLCALC 110 (H) 01/17/2024   LDLDIRECT 108.0 06/03/2023   TRIG 63 01/17/2024   CHOLHDL 2.4 01/17/2024

## 2024-06-12 ENCOUNTER — Other Ambulatory Visit (INDEPENDENT_AMBULATORY_CARE_PROVIDER_SITE_OTHER)

## 2024-06-12 ENCOUNTER — Ambulatory Visit: Payer: Federal, State, Local not specified - PPO | Admitting: Internal Medicine

## 2024-06-12 ENCOUNTER — Other Ambulatory Visit

## 2024-06-12 ENCOUNTER — Ambulatory Visit: Payer: Federal, State, Local not specified - PPO

## 2024-06-12 DIAGNOSIS — E782 Mixed hyperlipidemia: Secondary | ICD-10-CM

## 2024-06-12 DIAGNOSIS — E034 Atrophy of thyroid (acquired): Secondary | ICD-10-CM | POA: Diagnosis not present

## 2024-06-12 DIAGNOSIS — L659 Nonscarring hair loss, unspecified: Secondary | ICD-10-CM | POA: Diagnosis not present

## 2024-06-12 DIAGNOSIS — E559 Vitamin D deficiency, unspecified: Secondary | ICD-10-CM

## 2024-06-12 DIAGNOSIS — I951 Orthostatic hypotension: Secondary | ICD-10-CM

## 2024-06-12 DIAGNOSIS — L649 Androgenic alopecia, unspecified: Secondary | ICD-10-CM | POA: Diagnosis not present

## 2024-06-12 LAB — IBC PANEL
Iron: 96 ug/dL (ref 42–145)
Saturation Ratios: 30.7 % (ref 20.0–50.0)
TIBC: 312.2 ug/dL (ref 250.0–450.0)
Transferrin: 223 mg/dL (ref 212.0–360.0)

## 2024-06-12 LAB — BASIC METABOLIC PANEL WITH GFR
BUN: 16 mg/dL (ref 6–23)
CO2: 28 meq/L (ref 19–32)
Calcium: 9.7 mg/dL (ref 8.4–10.5)
Chloride: 105 meq/L (ref 96–112)
Creatinine, Ser: 0.77 mg/dL (ref 0.40–1.20)
GFR: 72.44 mL/min (ref >60.00)
Glucose, Bld: 95 mg/dL (ref 70–99)
Potassium: 4 meq/L (ref 3.5–5.1)
Sodium: 142 meq/L (ref 135–145)

## 2024-06-12 LAB — VITAMIN D 25 HYDROXY (VIT D DEFICIENCY, FRACTURES): VITD: 32.89 ng/mL (ref 30.00–100.00)

## 2024-06-12 LAB — CORTISOL: Cortisol, Plasma: 13.3 ug/dL

## 2024-06-12 LAB — TSH: TSH: 1.61 u[IU]/mL (ref 0.35–5.50)

## 2024-06-12 LAB — CBC WITH DIFFERENTIAL/PLATELET
Basophils Absolute: 0.1 K/uL (ref 0.0–0.1)
Basophils Relative: 1.9 % (ref 0.0–3.0)
Eosinophils Absolute: 0.1 K/uL (ref 0.0–0.7)
Eosinophils Relative: 1.5 % (ref 0.0–5.0)
HCT: 39.1 % (ref 36.0–46.0)
Hemoglobin: 12.9 g/dL (ref 12.0–15.0)
Lymphocytes Relative: 27.9 % (ref 12.0–46.0)
Lymphs Abs: 1.1 K/uL (ref 0.7–4.0)
MCHC: 33 g/dL (ref 30.0–36.0)
MCV: 92.5 fl (ref 78.0–100.0)
Monocytes Absolute: 0.3 K/uL (ref 0.1–1.0)
Monocytes Relative: 6.9 % (ref 3.0–12.0)
Neutro Abs: 2.5 K/uL (ref 1.4–7.7)
Neutrophils Relative %: 61.8 % (ref 43.0–77.0)
Platelets: 264 K/uL (ref 150.0–400.0)
RBC: 4.23 Mil/uL (ref 3.87–5.11)
RDW: 14.5 % (ref 11.5–15.5)
WBC: 4.1 K/uL (ref 4.0–10.5)

## 2024-06-12 LAB — LIPID PANEL
Cholesterol: 214 mg/dL — ABNORMAL HIGH (ref 0–200)
HDL: 85.1 mg/dL (ref >39.00)
LDL Cholesterol: 116 mg/dL — ABNORMAL HIGH (ref 0–99)
NonHDL: 128.71
Total CHOL/HDL Ratio: 3
Triglycerides: 62 mg/dL (ref 0.0–149.0)
VLDL: 12.4 mg/dL (ref 0.0–40.0)

## 2024-06-17 ENCOUNTER — Ambulatory Visit: Payer: Self-pay | Admitting: Internal Medicine

## 2024-06-17 DIAGNOSIS — L659 Nonscarring hair loss, unspecified: Secondary | ICD-10-CM

## 2024-06-18 LAB — VITAMIN D 1,25 DIHYDROXY
Vitamin D 1, 25 (OH)2 Total: 31 pg/mL (ref 18–72)
Vitamin D2 1, 25 (OH)2: <8 pg/mL
Vitamin D3 1, 25 (OH)2: 31 pg/mL

## 2024-06-18 LAB — DHEA-SULFATE: DHEA-SO4: 31 ug/dL (ref 4–157)

## 2024-06-18 LAB — TESTOS,TOTAL,FREE AND SHBG (FEMALE)
Free Testosterone: 6.1 pg/mL — ABNORMAL HIGH (ref 0.2–3.7)
Sex Hormone Binding: 127 nmol/L — ABNORMAL HIGH (ref 14–73)
Testosterone, Total, LC-MS-MS: 74 ng/dL — ABNORMAL HIGH (ref 2–45)

## 2024-06-18 MED ORDER — SPIRONOLACTONE 25 MG PO TABS: 25.0000 mg | ORAL_TABLET | Freq: Every day | ORAL | 3 refills | Status: DC

## 2024-06-18 MED ORDER — SPIRONOLACTONE 50 MG PO TABS: 50.0000 mg | ORAL_TABLET | Freq: Every day | ORAL | 1 refills | Status: DC

## 2024-06-18 NOTE — Assessment & Plan Note (Signed)
 Starting  spironolactone  for elevated testosterone.

## 2024-07-07 ENCOUNTER — Ambulatory Visit

## 2024-07-07 VITALS — Ht 62.0 in | Wt 123.0 lb

## 2024-07-07 DIAGNOSIS — Z0000 Encounter for general adult medical examination without abnormal findings: Secondary | ICD-10-CM
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
## 2024-07-07 NOTE — Patient Instructions (Addendum)
 Raven [name] , Thank you for taking time out of your busy schedule to complete your Annual Wellness Visit with me. I enjoyed our conversation and look forward to speaking with you again next year. I, as well as your care team,  appreciate your ongoing commitment to your health goals. Please review the following plan we discussed and let me know if I can assist you in the future. Your Game plan/ To Do List    Referrals: If you haven't heard from the office you've been referred to, please reach out to them at the phone provided.   Follow up Visits: We will see or speak with you next year for your Next Medicare AWV with our clinical staff Have you seen your provider in the last 6 months (3 months if uncontrolled diabetes)? Yes  Clinician Recommendations:  Aim for 30 minutes of exercise or brisk walking, 6-8 glasses of water, and 5 servings of fruits and vegetables each day. Educated and advised on getting the Tdap vaccine in 2025.        This is a list of the screenings recommended for you:  Health Maintenance  Topic Date Due   COVID-19 Vaccine (8 - Moderna risk 2024-25 season) 02/20/2024   DTaP/Tdap/Td vaccine (3 - Td or Tdap) 06/01/2024   Flu Shot  06/26/2024   Mammogram  07/15/2024   Medicare Annual Wellness Visit  07/07/2025   Pneumococcal Vaccine for age over 17  Completed   DEXA scan (bone density measurement)  Completed   Zoster (Shingles) Vaccine  Completed   Hepatitis B Vaccine  Aged Out   HPV Vaccine  Aged Out   Meningitis B Vaccine  Aged Out   Hepatitis C Screening  Discontinued    Advanced directives: (Copy Requested) Please bring a copy of your health care power of attorney and living will to the office to be added to your chart at your convenience. You can mail to [facility] [address] [place], [place] [address] or email to [email or web address] Advance Care Planning is important because it:  [x]  Makes sure you receive the medical care that is  consistent with your values, goals, and preferences  [x]  It provides guidance to your family and loved ones and reduces their decisional burden about whether or not they are making the right decisions based on your wishes.  Follow the link provided in your after visit summary or read over the paperwork we have mailed to you to help you started getting your Advance Directives in place. If you need assistance in completing these, please reach out to us  so that we can help you!

## 2024-07-08 DIAGNOSIS — H43813 Vitreous degeneration, bilateral: Secondary | ICD-10-CM | POA: Diagnosis not present

## 2024-07-08 DIAGNOSIS — G43109 Migraine with aura, not intractable, without status migrainosus: Secondary | ICD-10-CM | POA: Diagnosis not present

## 2024-07-08 DIAGNOSIS — H04123 Dry eye syndrome of bilateral lacrimal glands: Secondary | ICD-10-CM | POA: Diagnosis not present

## 2024-07-10 ENCOUNTER — Encounter: Payer: Self-pay | Admitting: Internal Medicine

## 2024-07-10 DIAGNOSIS — Z23 Encounter for immunization: Secondary | ICD-10-CM

## 2024-07-10 NOTE — Telephone Encounter (Signed)
 Tdap has been added to chart. Pt would like to know if she should get a covid booster?

## 2024-08-05 ENCOUNTER — Ambulatory Visit: Admitting: Internal Medicine

## 2024-08-05 ENCOUNTER — Telehealth: Payer: Self-pay

## 2024-08-05 DIAGNOSIS — Z23 Encounter for immunization: Secondary | ICD-10-CM

## 2024-08-05 NOTE — Telephone Encounter (Signed)
 Copied from CRM #[id number]. Topic: Clinical - Medication Question >> Aug 05, 2024  4:41 PM [place] J wrote: Reason for CRM: Patient is wanting to know if she can get a prescription sent for a COVID vaccination.  [facility] [address] [place], [place] [address]

## 2024-08-05 NOTE — Telephone Encounter (Signed)
 Copied from CRM #[id number]. Topic: Clinical - Medical Advice >> Aug 05, 2024  4:43 PM [place] J wrote: Reason for CRM: Patient would like to know if it's okay to take both RSV and the CCOVID vaccination on the same day.  Please call [phone], she is aware of the next day turn around time.

## 2024-08-06 NOTE — Addendum Note (Signed)
 Addended by: Delanda Bulluck on: 08/06/2024 12:20 PM   Modules accepted: Orders

## 2024-08-06 NOTE — Telephone Encounter (Signed)
 Patient is requesting a prescription to be sent to her pharmacy for her COVID vaccine. Please cal when sent.

## 2024-08-07 MED ORDER — COVID-19 MRNA VACC (MODERNA) 50 MCG/0.5ML IM SUSY: 0.5000 mL | PREFILLED_SYRINGE | Freq: Once | INTRAMUSCULAR | 0 refills | Status: AC

## 2024-08-07 MED ORDER — COVID-19 MRNA VACC (MODERNA) 50 MCG/0.5ML IM SUSY: 0.5000 mL | PREFILLED_SYRINGE | Freq: Once | INTRAMUSCULAR | 0 refills | Status: DC

## 2024-08-07 MED ORDER — COVID-19 MRNA VAC-TRIS(PFIZER) 30 MCG/0.3ML IM SUSY: 0.3000 mL | PREFILLED_SYRINGE | Freq: Once | INTRAMUSCULAR | 0 refills | Status: DC

## 2024-08-07 MED ORDER — COVID-19 MRNA VACC (MODERNA) 50 MCG/0.5ML IM SUSP: 0.5000 mL | Freq: Once | INTRAMUSCULAR | 0 refills | Status: AC

## 2024-08-07 NOTE — Telephone Encounter (Unsigned)
 Copied from CRM #[id number]. Topic: Clinical - Medication Question >> Aug 07, 2024 11:04 AM Rea ORN wrote: Reason for CRM: Pt called back to get a follow up on request from 9/10 for Covid vaccine. I advise pt of the encounters and that PCP wanted her to wait 2 weeks from getting RSV. Pt stated she has not gotten the RSV yet and is reconsidering getting it. She would like to take the Covid vaccine first.  Pt stated she would like the vaccine sent [facility] [address] (This is a Science writer from the original request).

## 2024-08-07 NOTE — Addendum Note (Signed)
 Addended by: Editha Bridgeforth on: 08/07/2024 01:08 PM   Modules accepted: Orders

## 2024-08-07 NOTE — Addendum Note (Signed)
 Addended by: HARRIETTE RAISIN on: 08/07/2024 01:28 PM   Modules accepted: Orders

## 2024-08-07 NOTE — Addendum Note (Signed)
 Addended by: MARYLYNN VERNEITA CROME on: 08/07/2024 11:46 AM   Modules accepted: Orders

## 2024-08-07 NOTE — Telephone Encounter (Signed)
 Spoke with pt to find out which vaccine she would like sent in. Pt stated that she would like the moderna sent to [facility] on [address]. Per verbal okay from Dr. Marylynn I have sent rx to the pharmacy.

## 2024-08-09 ENCOUNTER — Encounter: Payer: Self-pay | Admitting: Internal Medicine

## 2024-08-10 ENCOUNTER — Encounter (INDEPENDENT_AMBULATORY_CARE_PROVIDER_SITE_OTHER): Payer: Self-pay

## 2024-08-31 ENCOUNTER — Ambulatory Visit: Payer: Self-pay

## 2024-08-31 ENCOUNTER — Other Ambulatory Visit: Payer: Self-pay | Admitting: Internal Medicine

## 2024-08-31 DIAGNOSIS — Z1231 Encounter for screening mammogram for malignant neoplasm of breast: Secondary | ICD-10-CM

## 2024-08-31 NOTE — Telephone Encounter (Signed)
 Pt is scheduled to see Dr. Onesimo on 09/02/2024.

## 2024-08-31 NOTE — Telephone Encounter (Signed)
 FYI Only or Action Required?: FYI only for provider.  Patient was last seen in primary care on 06/05/2024 by Raven Verneita CROME, MD.  Called Nurse Triage reporting Dizziness.  Symptoms began yesterday.  Interventions attempted: Nothing.  Symptoms are: gradually improving.  Triage Disposition: See PCP When Office is Open (Within 3 Days)  Patient/caregiver understands and will follow disposition?: Yes      Copied from CRM #[id number]. Topic: Clinical - Red Word Triage >> Aug 31, 2024  8:14 AM Frederich PARAS wrote: [facility] that prompted transfer to Nurse Triage: dizziness  last evening, felt faint, laid on kitchen floor, took bp which is normally high it was low 108/66, 94/59,135/78 dizziness, had a emt come, pt still feels shaking, Reason for Disposition  [1] MILD dizziness (e.g., walking normally) AND [2] has NOT been evaluated by doctor (or NP/PA) for this  (Exception: Dizziness caused by heat exposure, sudden standing, or poor fluid intake.)  Answer Assessment - Initial Assessment Questions 1. DESCRIPTION: Describe your dizziness.     Shaky- anxious   yesterday felt wave of nausea last before episode 2. LIGHTHEADED: Do you feel lightheaded? (e.g., somewhat faint, woozy, weak upon standing)     no 3. VERTIGO: Do you feel like either you or the room is spinning or tilting? (i.e., vertigo)     no 4. SEVERITY: How bad is it?  Do you feel like you are going to faint? Can you stand and walk?     No/yes 5. ONSET:  When did the dizziness begin?     Yesterday 2100 6. AGGRAVATING FACTORS: Does anything make it worse? (e.g., standing, change in head position)     N/a 7. HEART RATE: Can you tell me your heart rate? How many beats in 15 seconds?  (Note: Not all patients can do this.)       135/78  (93/59 113/64 last night ) 8. CAUSE: What do you think is causing the dizziness? (e.g., decreased fluids or food, diarrhea, emotional distress, heat exposure, new medicine, sudden  standing, vomiting; unknown)     Anxiety- yesterday may be from dehydration 10. OTHER SYMPTOMS: Do you have any other symptoms? (e.g., fever, chest pain, vomiting, diarrhea, bleeding)       no 11. PREGNANCY: Is there any chance you are pregnant? When was your last menstrual period?       N/a  Protocols used: Dizziness - Lightheadedness-A-AH

## 2024-09-02 ENCOUNTER — Encounter: Payer: Self-pay | Admitting: Internal Medicine

## 2024-09-02 ENCOUNTER — Ambulatory Visit (INDEPENDENT_AMBULATORY_CARE_PROVIDER_SITE_OTHER): Admitting: Internal Medicine

## 2024-09-02 VITALS — BP 124/72 | HR 78 | Temp 97.8°F | Ht 62.0 in | Wt 120.6 lb

## 2024-09-02 DIAGNOSIS — R42 Dizziness and giddiness: Secondary | ICD-10-CM | POA: Diagnosis not present

## 2024-09-02 NOTE — Progress Notes (Signed)
 Acute Office Visit  Subjective:     Patient ID: Raven [name], female    DOB: 1941/11/10, 83 y.o.   MRN: [id number]  Chief Complaint  Patient presents with   Acute Visit    08/31/24 Dizzy and shakey - fell on floor When she came to BP was 98/4?, 113/??, 135/7? When the EMT arrived    HPI Patient is in today for follow-up of an episode of lightheadedness.  Patient states that on Sunday night (3 ago) she had an episode where she felt lightheaded after dinner and had to lay down on the floor.  She denies any loss of consciousness.  It was associated with nausea but no vomiting.  Patiently on the floor for a few minutes and then felt a little better.  She then checked her blood pressure and it was low (98 over 40s) and that she repeated it and it had improved with systolics in the 110s. EMT was called and patient was noted to have a normal blood pressure (130s over 70s).  She has not had any further episodes of lightheadedness or weakness since then.  She has gone to her yoga class the past 2 days without any issues.  Patient states that on the day of the episode she had not drank as much water as she wanted to and had been out most of the day.  She did have a frozen pizza and beer for dinner.  Episode occurred after dinner when she was walking back to the kitchen to get ice cream.  No chest pain, no palpitations, no syncope, no fevers or chills.  No focal weakness.  No tremors.  Review of Systems  Constitutional: Negative.   HENT: Negative.    Respiratory: Negative.    Cardiovascular: Negative.   Gastrointestinal:  Positive for nausea. Negative for abdominal pain, diarrhea and vomiting.  Musculoskeletal: Negative.   Neurological:  Positive for dizziness. Negative for tremors, focal weakness and seizures.  Psychiatric/Behavioral: Negative.          Objective:    BP 124/72   Pulse 78   Temp 97.8 F (36.6 C)   Ht 5' 2 (1.575 m)   Wt 120 lb 9.6 oz (54.7 kg)   SpO2 98%   BMI 22.06  kg/m    Physical Exam Constitutional:      Appearance: Normal appearance.  HENT:     Head: Normocephalic and atraumatic.  Cardiovascular:     Rate and Rhythm: Normal rate and regular rhythm.     Heart sounds: Normal heart sounds.  Pulmonary:     Effort: Pulmonary effort is normal.     Breath sounds: Normal breath sounds. No wheezing, rhonchi or rales.  Abdominal:     General: Bowel sounds are normal. There is no distension.     Palpations: Abdomen is soft.     Tenderness: There is no abdominal tenderness. There is no guarding or rebound.  Musculoskeletal:        General: No swelling or tenderness.     Right lower leg: No edema.     Left lower leg: No edema.  Neurological:     Mental Status: She is alert.  Psychiatric:        Mood and Affect: Mood normal.        Behavior: Behavior normal.     No results found for any visits on 09/02/24.      Assessment & Plan:   Problem List Items Addressed This Visit  Other   Dizziness   - Patient had episode approximately 3 nights ago where she had an episode of dizziness and had to lie down on the floor.  This is accompanied with nausea.  No other symptoms were noted.  Symptoms resolved within a few minutes. -She did note lower blood pressures in the 90s over 40s when she checked it which improved to the 110s systolic.  By the time EMT arrived her blood pressure improved to the 130s over 70s.  - She has not had any recurrent symptoms -I suspect that the etiology is likely multifactorial including a possible vasovagal attack given her associated nausea and initially lower blood pressures which improved as well as a component of dehydration (on spironolactone  and had not been drinking much water that day) -Will obtain EKG for further evaluation -If patient does have recurrent episode would prefer to cardiology for further evaluation and obtain a Holter monitor -No further workup at this time  Addendum: - EKG shows normal sinus  rhythm with no acute ST/T wave changes      Other Visit Diagnoses       Dizzy    -  Primary   Relevant Orders   EKG 12-Lead (Completed)       No orders of the defined types were placed in this encounter.   No follow-ups on file.  Burris Matherne, MD

## 2024-09-02 NOTE — Assessment & Plan Note (Addendum)
-   Patient had episode approximately 3 nights ago where she had an episode of dizziness and had to lie down on the floor.  This is accompanied with nausea.  No other symptoms were noted.  Symptoms resolved within a few minutes. -She did note lower blood pressures in the 90s over 40s when she checked it which improved to the 110s systolic.  By the time EMT arrived her blood pressure improved to the 130s over 70s.  - She has not had any recurrent symptoms -I suspect that the etiology is likely multifactorial including a possible vasovagal attack given her associated nausea and initially lower blood pressures which improved as well as a component of dehydration (on spironolactone  and had not been drinking much water that day) -Will obtain EKG for further evaluation -If patient does have recurrent episode would prefer to cardiology for further evaluation and obtain a Holter monitor -No further workup at this time  Addendum: - EKG shows normal sinus rhythm with no acute ST/T wave changes

## 2024-09-02 NOTE — Patient Instructions (Addendum)
-   It was a pleasure seeing you today -I suspect that the etiology behind your episode of dizziness is likely multifactorial with a component of dehydration and a possible vasovagal attack -EKG done today was within normal limits -If you do have a recurrent episode we will consider referral to cardiology as well as obtaining a heart monitor to rule out an abnormal heart rhythm as a cause for your symptoms -Please contact us  with any questions or concerns or if you have any recurrent symptoms

## 2024-09-03 ENCOUNTER — Telehealth: Payer: Self-pay | Admitting: Internal Medicine

## 2024-09-03 NOTE — Telephone Encounter (Unsigned)
 Copied from CRM [id number]. Topic: Clinical - Medication Question >> Sep 03, 2024  9:32 AM Alfonso HERO wrote: Reason for RMF:[id number] called because she was instructed to increase dosage of spironolactone  (ALDACTONE ) 50 MG tablet to 2 a day if she can tolerate it. She is needing more. Can a new Rx be sent to the pharmacy for more to be in the script because as of now its only for a 90 day supply and she needs more because she is taking more.

## 2024-09-03 NOTE — Telephone Encounter (Signed)
 Spoke with pt and she stated that she needs a new rx for the spironolactone  because in July she was advised to increase to 2 tablets daily, 100 mg total. Pt stated that she did increase to 2 tablets daily but felt that was to much so she has been alternating 1 tablet daily with 2 tablets daily. She stated that she takes 1 tablet on the odd days and 2 tablets on the even days.

## 2024-09-04 MED ORDER — SPIRONOLACTONE 50 MG PO TABS: ORAL_TABLET | ORAL | 2 refills | Status: DC

## 2024-09-04 MED ORDER — SPIRONOLACTONE 50 MG PO TABS: 100.0000 mg | ORAL_TABLET | Freq: Two times a day (BID) | ORAL | 2 refills | Status: DC

## 2024-09-04 NOTE — Telephone Encounter (Signed)
 Spoke with pt, she is taking 1 tablet daily on odd days & 2 tablets daily on even days.  I am correcting prescription & will call the pharmacy back.

## 2024-09-04 NOTE — Addendum Note (Signed)
 Addended by: BRIEN SHARENE RAMAN on: 09/04/2024 04:57 PM   Modules accepted: Orders

## 2024-09-04 NOTE — Telephone Encounter (Unsigned)
 Copied from CRM [id number]. Topic: Clinical - Medication Question >> Sep 04, 2024 10:07 AM Anairis L wrote: Reason for CRM:  Medford from [facility] is calling in for clarification on spironolactone  (ALDACTONE ) 50 MG tablet script sent in. They medication is doubling and he was a bit concern, Please call back.    [phone]

## 2024-09-04 NOTE — Telephone Encounter (Signed)
 Pharmacy notified.

## 2024-09-04 NOTE — Telephone Encounter (Signed)
 Chris from [facility] is calling in to follow up on this mornings call about medication.  Please call Medford back at [phone]

## 2024-09-18 DIAGNOSIS — Z23 Encounter for immunization: Secondary | ICD-10-CM | POA: Diagnosis not present

## 2024-09-21 ENCOUNTER — Encounter: Payer: Self-pay | Admitting: Internal Medicine

## 2024-09-22 ENCOUNTER — Ambulatory Visit (INDEPENDENT_AMBULATORY_CARE_PROVIDER_SITE_OTHER): Payer: Federal, State, Local not specified - PPO | Admitting: Dermatology

## 2024-09-22 DIAGNOSIS — L570 Actinic keratosis: Secondary | ICD-10-CM

## 2024-09-22 DIAGNOSIS — D229 Melanocytic nevi, unspecified: Secondary | ICD-10-CM

## 2024-09-22 DIAGNOSIS — Z1283 Encounter for screening for malignant neoplasm of skin: Secondary | ICD-10-CM

## 2024-09-22 DIAGNOSIS — Z7189 Other specified counseling: Secondary | ICD-10-CM

## 2024-09-22 DIAGNOSIS — L578 Other skin changes due to chronic exposure to nonionizing radiation: Secondary | ICD-10-CM | POA: Diagnosis not present

## 2024-09-22 DIAGNOSIS — L821 Other seborrheic keratosis: Secondary | ICD-10-CM | POA: Diagnosis not present

## 2024-09-22 DIAGNOSIS — L649 Androgenic alopecia, unspecified: Secondary | ICD-10-CM

## 2024-09-22 DIAGNOSIS — L820 Inflamed seborrheic keratosis: Secondary | ICD-10-CM

## 2024-09-22 DIAGNOSIS — L814 Other melanin hyperpigmentation: Secondary | ICD-10-CM

## 2024-09-22 DIAGNOSIS — D692 Other nonthrombocytopenic purpura: Secondary | ICD-10-CM

## 2024-09-22 NOTE — Patient Instructions (Addendum)

## 2024-09-22 NOTE — Progress Notes (Unsigned)
 Follow-Up Visit   Subjective  Raven [name] is a 83 y.o. female who presents for the following: Skin Cancer Screening and Full Body Skin Exam hx of Aks, check spots bil dorsum hands, L cheek, both scaly, hx of hair loss, currently taking Spironolactone   The patient presents for Total-Body Skin Exam (TBSE) for skin cancer screening and mole check. The patient has spots, moles and lesions to be evaluated, some may be new or changing and the patient may have concern these could be cancer.  The following portions of the chart were reviewed this encounter and updated as appropriate: medications, allergies, medical history  Review of Systems:  No other skin or systemic complaints except as noted in HPI or Assessment and Plan.  Objective  Well appearing patient in no apparent distress; mood and affect are within normal limits.  A full examination was performed including scalp, head, eyes, ears, nose, lips, neck, chest, axillae, abdomen, back, buttocks, bilateral upper extremities, bilateral lower extremities, hands, feet, fingers, toes, fingernails, and toenails. All findings within normal limits unless otherwise noted below.   Relevant physical exam findings are noted in the Assessment and Plan.  L dorsum hand x 1, R dorsum hand x 1, L cheek x 2 (4) Stuck on waxy paps with erythema L upper lip x 1 Pink scaly macules  Assessment & Plan   SKIN CANCER SCREENING PERFORMED TODAY.  ACTINIC DAMAGE - Chronic condition, secondary to cumulative UV/sun exposure - diffuse scaly erythematous macules with underlying dyspigmentation - Recommend daily broad spectrum sunscreen SPF 30+ to sun-exposed areas, reapply every 2 hours as needed.  - Staying in the shade or wearing long sleeves, sun glasses (UVA+UVB protection) and wide brim hats (4-inch brim around the entire circumference of the hat) are also recommended for sun protection.  - Call for new or changing lesions.  LENTIGINES, SEBORRHEIC  KERATOSES, HEMANGIOMAS - Benign normal skin lesions - Benign-appearing - Call for any changes  MELANOCYTIC NEVI - Tan-brown and/or pink-flesh-colored symmetric macules and papules - Benign appearing on exam today - Observation - Call clinic for new or changing moles - Recommend daily use of broad spectrum spf 30+ sunscreen to sun-exposed areas.   Purpura - Chronic; persistent and recurrent.  Treatable, but not curable. hands - Violaceous macules and patches - Benign - Related to trauma, age, sun damage and/or use of blood thinners, chronic use of topical and/or oral steroids - Observe - Can use OTC arnica containing moisturizer such as Dermend Bruise Formula if desired - Call for worsening or other concerns  ANDROGENETIC ALOPECIA (FEMALE PATTERN HAIR LOSS) Exam: Diffuse thinning of the crown and widening of the midline part with retention of the frontal hairline Chronic and persistent condition with duration or expected duration over one year. Condition is symptomatic / bothersome to patient. Not to goal. Female Androgenic Alopecia is a chronic condition related to genetics and/or hormonal changes.  In women androgenetic alopecia is commonly associated with menopause but may occur any time after puberty.  It causes hair thinning primarily on the crown with widening of the part and temporal hairline recession.  Can use OTC Rogaine (minoxidil) 5% solution/foam as directed.  Oral treatments in female patients who have no contraindication may include : - Low dose oral minoxidil 1.25 - 5mg  daily - Spironolactone  50 - 100mg  bid - Finasteride 2.5 - 5 mg daily Adjunctive therapies include: - Low Level Laser Light Therapy (LLLT) - Platelet-rich plasma injections (PRP) - Hair Transplants or scalp reduction  Treatment Plan: Discussed oral Minoxidil, pt declines Cont Spironolactone  50mg  as prescribed by Dr. Verneita Kettering.  Spironolactone  can cause increased urination and cause blood pressure  to decrease. Please watch for signs of lightheadedness and be cautious when changing position. It can sometimes cause breast tenderness or an irregular period in premenopausal women. It can also increase potassium. The increase in potassium usually is not a concern unless you are taking other medicines that also increase potassium, so please be sure your doctor knows all of the other medications you are taking. This medication should not be taken by pregnant women.  This medicine should also not be taken together with sulfa drugs like Bactrim (trimethoprim/sulfamethexazole).    INFLAMED SEBORRHEIC KERATOSIS (4) L dorsum hand x 1, R dorsum hand x 1, L cheek x 2 (4) Symptomatic, irritating, patient would like treated. Destruction of lesion - L dorsum hand x 1, R dorsum hand x 1, L cheek x 2 (4) Complexity: simple   Destruction method: cryotherapy   Informed consent: discussed and consent obtained   Timeout:  patient name, date of birth, surgical site, and procedure verified Lesion destroyed using liquid nitrogen: Yes   Region frozen until ice ball extended beyond lesion: Yes   Outcome: patient tolerated procedure well with no complications   Post-procedure details: wound care instructions given    AK (ACTINIC KERATOSIS) L upper lip x 1 Actinic keratoses are precancerous spots that appear secondary to cumulative UV radiation exposure/sun exposure over time. They are chronic with expected duration over 1 year. A portion of actinic keratoses will progress to squamous cell carcinoma of the skin. It is not possible to reliably predict which spots will progress to skin cancer and so treatment is recommended to prevent development of skin cancer.  Recommend daily broad spectrum sunscreen SPF 30+ to sun-exposed areas, reapply every 2 hours as needed.  Recommend staying in the shade or wearing long sleeves, sun glasses (UVA+UVB protection) and wide brim hats (4-inch brim around the entire circumference of  the hat). Call for new or changing lesions. Destruction of lesion - L upper lip x 1 Complexity: simple   Destruction method: cryotherapy   Informed consent: discussed and consent obtained   Timeout:  patient name, date of birth, surgical site, and procedure verified Lesion destroyed using liquid nitrogen: Yes   Region frozen until ice ball extended beyond lesion: Yes   Outcome: patient tolerated procedure well with no complications   Post-procedure details: wound care instructions given    Return in about 1 year (around 09/22/2025) for TBSE, Hx of AKs.  I, Grayce Saunas, RMA, am acting as scribe for Alm Rhyme, MD .   Documentation: I have reviewed the above documentation for accuracy and completeness, and I agree with the above.  Alm Rhyme, MD

## 2024-09-23 ENCOUNTER — Encounter: Payer: Self-pay | Admitting: Dermatology

## 2024-09-28 ENCOUNTER — Inpatient Hospital Stay: Admission: RE | Admit: 2024-09-28 | Source: Ambulatory Visit

## 2024-11-10 ENCOUNTER — Ambulatory Visit
Admission: RE | Admit: 2024-11-10 | Discharge: 2024-11-10 | Disposition: A | Source: Ambulatory Visit | Attending: Internal Medicine | Admitting: Internal Medicine

## 2024-11-10 DIAGNOSIS — Z1231 Encounter for screening mammogram for malignant neoplasm of breast: Secondary | ICD-10-CM | POA: Insufficient documentation

## 2024-12-07 ENCOUNTER — Ambulatory Visit: Admitting: Internal Medicine

## 2024-12-07 ENCOUNTER — Encounter: Payer: Self-pay | Admitting: Internal Medicine

## 2024-12-07 VITALS — BP 136/84 | HR 83 | Ht 62.0 in | Wt 121.0 lb

## 2024-12-07 DIAGNOSIS — I951 Orthostatic hypotension: Secondary | ICD-10-CM

## 2024-12-07 DIAGNOSIS — L659 Nonscarring hair loss, unspecified: Secondary | ICD-10-CM

## 2024-12-07 DIAGNOSIS — E782 Mixed hyperlipidemia: Secondary | ICD-10-CM

## 2024-12-07 DIAGNOSIS — R030 Elevated blood-pressure reading, without diagnosis of hypertension: Secondary | ICD-10-CM | POA: Diagnosis not present

## 2024-12-07 DIAGNOSIS — M25562 Pain in left knee: Secondary | ICD-10-CM | POA: Diagnosis not present

## 2024-12-07 DIAGNOSIS — M19111 Post-traumatic osteoarthritis, right shoulder: Secondary | ICD-10-CM

## 2024-12-07 DIAGNOSIS — E034 Atrophy of thyroid (acquired): Secondary | ICD-10-CM | POA: Diagnosis not present

## 2024-12-07 DIAGNOSIS — R419 Unspecified symptoms and signs involving cognitive functions and awareness: Secondary | ICD-10-CM

## 2024-12-07 DIAGNOSIS — G8929 Other chronic pain: Secondary | ICD-10-CM

## 2024-12-07 LAB — COMPREHENSIVE METABOLIC PANEL WITH GFR
ALT: 19 U/L (ref 3–35)
AST: 26 U/L (ref 5–37)
Albumin: 4.3 g/dL (ref 3.5–5.2)
Alkaline Phosphatase: 48 U/L (ref 39–117)
BUN: 17 mg/dL (ref 6–23)
CO2: 28 meq/L (ref 19–32)
Calcium: 9.9 mg/dL (ref 8.4–10.5)
Chloride: 101 meq/L (ref 96–112)
Creatinine, Ser: 0.82 mg/dL (ref 0.40–1.20)
GFR: 66.94 mL/min
Glucose, Bld: 87 mg/dL (ref 70–99)
Potassium: 4.5 meq/L (ref 3.5–5.1)
Sodium: 136 meq/L (ref 135–145)
Total Bilirubin: 0.4 mg/dL (ref 0.2–1.2)
Total Protein: 7 g/dL (ref 6.0–8.3)

## 2024-12-07 LAB — LIPID PANEL
Cholesterol: 210 mg/dL — ABNORMAL HIGH (ref 28–200)
HDL: 79.3 mg/dL
LDL Cholesterol: 112 mg/dL — ABNORMAL HIGH (ref 10–99)
NonHDL: 130.48
Total CHOL/HDL Ratio: 3
Triglycerides: 91 mg/dL (ref 10.0–149.0)
VLDL: 18.2 mg/dL (ref 0.0–40.0)

## 2024-12-07 LAB — TSH: TSH: 1.91 u[IU]/mL (ref 0.35–5.50)

## 2024-12-07 LAB — LDL CHOLESTEROL, DIRECT: Direct LDL: 110 mg/dL

## 2024-12-07 MED ORDER — LEVOTHYROXINE SODIUM 75 MCG PO TABS: 75.0000 ug | ORAL_TABLET | Freq: Every day | ORAL | 1 refills | Status: AC

## 2024-12-07 NOTE — Assessment & Plan Note (Signed)
 Referring to Dr Maree.  Previous referral was made but patient did not follow up on letter of notification

## 2024-12-07 NOTE — Assessment & Plan Note (Signed)
 She was seeing an improvement with use of spironolactone  but stopped it due to hypotension.  She is considering subscribing to Nutrafol

## 2024-12-07 NOTE — Assessment & Plan Note (Signed)
 Unclear etiology: arthritis per orthopedics  but pain occasionally starts in hip and radiates to ankle.  Has epsioes of catching feeling in the knee suggestive of meniscal tear. Will order MRI if acupuncture fails

## 2024-12-07 NOTE — Progress Notes (Signed)
 "  Subjective:  Patient ID: Raven [name], female    DOB: Jul 17, 1941  Age: 84 y.o. MRN: [id number]  CC: The primary encounter diagnosis was Hypothyroidism due to acquired atrophy of thyroid . Diagnoses of Moderate mixed hyperlipidemia not requiring statin therapy, Hypercalcemia, Elevated blood pressure reading without diagnosis of hypertension, Cognitive complaints with normal neuropsychological exam, Chronic pain of left knee, Thinning hair, Post-traumatic osteoarthritis of right shoulder, and Orthostasis were also pertinent to this visit.   HPI TIONNA GIGANTE presents for  Chief Complaint  Patient presents with   Medical Management of Chronic Issues    6 month follow up    Hair loss: stopped spironolactone  due to recurrent hypotension . She felt that the medication was helping. Wondering if Nutrafol would be helpful   Left leg pain :  told she had arthritis by [facility] back in Dec 2024  and given rx for meloxicam which she took for a while.  Went to PT for 5 or 6 sessions,  has continued doing the exercises similar to Tai chi and Yoga which initially provided relief but with time has become less effective despite continuing the exercises.  The pain is intermittent and starts in the buttock and  occasionally radiates to the ankle.   The pain is aggravated by walking up a slight grade. She is scheduling an appointment with an  acupuncturist   History of right shoulder surgery over 10 yrs  ago,  now having pain in same shoulder for the past year.   Concerns about neurocognitive decline: she continues to exercise her brain  with games and is requesting repeat referral to Dr Maree .   Outpatient Medications Prior to Visit  Medication Sig Dispense Refill   aspirin-acetaminophen -caffeine (EXCEDRIN MIGRAINE) 250-250-65 MG per tablet Take 1 tablet by mouth every 6 (six) hours as needed for headache.     BIOTIN PO Take 1 tablet by mouth daily.     calcium-vitamin D  (OSCAL WITH D) 500-200  MG-UNIT per tablet Take 1 tablet by mouth daily with breakfast.     Collagen Hydrolysate POWD by Does not apply route.     diclofenac  Sodium (VOLTAREN ) 1 % GEL Apply 4 g topically 4 (four) times daily. 50 g 3   Multiple Vitamin (MULTIVITAMIN WITH MINERALS) TABS tablet Take 1 tablet by mouth daily.     Omega-3 Fatty Acids (OMEGA 3 PO) Take 1 capsule by mouth daily.     simvastatin  (ZOCOR ) 40 MG tablet Take 1 tablet (40 mg total) by mouth at bedtime. 90 tablet 3   levothyroxine  (SYNTHROID ) 75 MCG tablet Take 1 tablet (75 mcg total) by mouth daily before breakfast. 90 tablet 1   predniSONE  (DELTASONE ) 10 MG tablet 6 tablets on Day 1 , then reduce by 1 tablet daily until gone 21 tablet 0   spironolactone  (ALDACTONE ) 50 MG tablet Take 1 tablets (50 mg total) by mouth daily on odd days & 2 tablets (100 mg total) by mouth daily on even days. (Patient not taking: Reported on 12/07/2024) 135 tablet 2   No facility-administered medications prior to visit.    Review of Systems;  Patient denies headache, fevers, malaise, unintentional weight loss, skin rash, eye pain, sinus congestion and sinus pain, sore throat, dysphagia,  hemoptysis , cough, dyspnea, wheezing, chest pain, palpitations, orthopnea, edema, abdominal pain, nausea, melena, diarrhea, constipation, flank pain, dysuria, hematuria, urinary  Frequency, nocturia, numbness, tingling, seizures,  Focal weakness, Loss of consciousness,  Tremor, insomnia, depression, anxiety, and suicidal ideation.  Objective:  BP 136/84   Pulse 83   Ht 5' 2 (1.575 m)   Wt 121 lb (54.9 kg)   SpO2 99%   BMI 22.13 kg/m   BP Readings from Last 3 Encounters:  12/07/24 136/84  09/02/24 124/72  06/05/24 130/70    Wt Readings from Last 3 Encounters:  12/07/24 121 lb (54.9 kg)  09/02/24 120 lb 9.6 oz (54.7 kg)  07/07/24 123 lb (55.8 kg)    Physical Exam Vitals reviewed.  Constitutional:      General: She is not in acute distress.    Appearance: Normal  appearance. She is normal weight. She is not ill-appearing, toxic-appearing or diaphoretic.  HENT:     Head: Normocephalic.  Eyes:     General: No scleral icterus.       Right eye: No discharge.        Left eye: No discharge.     Conjunctiva/sclera: Conjunctivae normal.  Cardiovascular:     Rate and Rhythm: Normal rate and regular rhythm.     Heart sounds: Normal heart sounds.  Pulmonary:     Effort: Pulmonary effort is normal. No respiratory distress.     Breath sounds: Normal breath sounds.  Musculoskeletal:        General: Normal range of motion.  Skin:    General: Skin is warm and dry.  Neurological:     General: No focal deficit present.     Mental Status: She is alert and oriented to person, place, and time. Mental status is at baseline.  Psychiatric:        Mood and Affect: Mood normal.        Behavior: Behavior normal.        Thought Content: Thought content normal.        Judgment: Judgment normal.     Lab Results  Component Value Date   HGBA1C 5.6 06/03/2023   HGBA1C 5.7 02/07/2023   HGBA1C 5.7 02/16/2022    Lab Results  Component Value Date   CREATININE 0.82 12/07/2024   CREATININE 0.77 06/12/2024   CREATININE 0.82 01/17/2024    Lab Results  Component Value Date   WBC 4.1 06/12/2024   HGB 12.9 06/12/2024   HCT 39.1 06/12/2024   PLT 264.0 06/12/2024   GLUCOSE 87 12/07/2024   CHOL 210 (H) 12/07/2024   TRIG 91.0 12/07/2024   HDL 79.30 12/07/2024   LDLDIRECT 110.0 12/07/2024   LDLCALC 112 (H) 12/07/2024   ALT 19 12/07/2024   AST 26 12/07/2024   NA 136 12/07/2024   K 4.5 12/07/2024   CL 101 12/07/2024   CREATININE 0.82 12/07/2024   BUN 17 12/07/2024   CO2 28 12/07/2024   TSH 1.91 12/07/2024   INR 1.0 01/19/2015   HGBA1C 5.6 06/03/2023    MM 3D SCREENING MAMMOGRAM BILATERAL BREAST Result Date: 11/13/2024 CLINICAL DATA:  Screening. EXAM: DIGITAL SCREENING BILATERAL MAMMOGRAM WITH TOMOSYNTHESIS AND CAD TECHNIQUE: Bilateral screening digital  craniocaudal and mediolateral oblique mammograms were obtained. Bilateral screening digital breast tomosynthesis was performed. The images were evaluated with computer-aided detection. COMPARISON:  Previous exam(s). ACR Breast Density Category d: The breasts are extremely dense, which lowers the sensitivity of mammography. FINDINGS: There are no findings suspicious for malignancy. IMPRESSION: No mammographic evidence of malignancy. A result letter of this screening mammogram will be mailed directly to the patient. RECOMMENDATION: Screening mammogram in one year. (Code:SM-B-01Y) BI-RADS CATEGORY  1: Negative. Electronically Signed   By: Toribio Agreste M.D.   On: 11/13/2024  11:39    Assessment & Plan:  .Hypothyroidism due to acquired atrophy of thyroid  -     TSH  Moderate mixed hyperlipidemia not requiring statin therapy -     Lipid panel -     LDL cholesterol, direct  Hypercalcemia -     Comprehensive metabolic panel with GFR  Elevated blood pressure reading without diagnosis of hypertension Assessment & Plan: She  has no prior history of hypertension. Her home readings of  blood pressure have been normal.     Orders: -     Comprehensive metabolic panel with GFR  Cognitive complaints with normal neuropsychological exam Assessment & Plan: Referring to Dr Maree.  Previous referral was made but patient did not follow up on letter of notification  Orders: -     Ambulatory referral to Neurology  Chronic pain of left knee Assessment & Plan: Unclear etiology: arthritis per orthopedics  but pain occasionally starts in hip and radiates to ankle.  Has epsioes of catching feeling in the knee suggestive of meniscal tear. Will order MRI if acupuncture fails    Thinning hair Assessment & Plan: She was seeing an improvement with use of spironolactone  but stopped it due to hypotension.  She is considering subscribing to [facility]   Post-traumatic osteoarthritis of right shoulder Assessment &  Plan: DISCUSSED PHARMACOLOGIC AND NONPHARMACOLOGIC METHODS OF MANAGEMENT   , INCLUDNG TYLENOL  and judicious use of NSAID. Discussed trial of  Glucosamine /chondrotin sulfate    Orthostasis Assessment & Plan: Noted with  a drop in systolic of 19 pts and no change in pulse .resolved with spironolactone  cessation    Other orders -     Levothyroxine  Sodium; Take 1 tablet (75 mcg total) by mouth daily before breakfast.  Dispense: 90 tablet; Refill: 1     I spent 40 minutes on the day of this face to face encounter reviewing patient's  most recent visit with orthopedics , prior relevant surgical and non surgical procedures, recent  labs and imaging studies, counseling on memory management,  reviewing the assessment and plan with patient, and post visit ordering and reviewing of  diagnostics and therapeutics with patient  .   Follow-up: Return in about 6 months (around 06/06/2025) for physical.   Verneita LITTIE Kettering, MD "

## 2024-12-07 NOTE — Assessment & Plan Note (Signed)
 DISCUSSED PHARMACOLOGIC AND NONPHARMACOLOGIC METHODS OF MANAGEMENT   , INCLUDNG TYLENOL  and judicious use of NSAID. Discussed trial of  Glucosamine /chondrotin sulfate

## 2024-12-07 NOTE — Patient Instructions (Signed)
 I have repeated the referral to Dr Maree  If acupuncture does not help resolve your  left knee pain,  let me know and I will order an MRI

## 2024-12-07 NOTE — Assessment & Plan Note (Signed)
 She  has no prior history of hypertension. Her home readings of  blood pressure have been normal.

## 2024-12-07 NOTE — Assessment & Plan Note (Signed)
 Noted with  a drop in systolic of 19 pts and no change in pulse .resolved with spironolactone  cessation

## 2024-12-09 ENCOUNTER — Ambulatory Visit: Payer: Self-pay | Admitting: Internal Medicine

## 2024-12-10 ENCOUNTER — Ambulatory Visit: Admitting: Dermatology

## 2024-12-10 ENCOUNTER — Encounter: Payer: Self-pay | Admitting: Dermatology

## 2024-12-10 DIAGNOSIS — W908XXA Exposure to other nonionizing radiation, initial encounter: Secondary | ICD-10-CM

## 2024-12-10 DIAGNOSIS — L821 Other seborrheic keratosis: Secondary | ICD-10-CM

## 2024-12-10 DIAGNOSIS — L820 Inflamed seborrheic keratosis: Secondary | ICD-10-CM | POA: Diagnosis not present

## 2024-12-10 DIAGNOSIS — L570 Actinic keratosis: Secondary | ICD-10-CM

## 2024-12-10 DIAGNOSIS — L578 Other skin changes due to chronic exposure to nonionizing radiation: Secondary | ICD-10-CM | POA: Diagnosis not present

## 2024-12-10 DIAGNOSIS — L814 Other melanin hyperpigmentation: Secondary | ICD-10-CM

## 2024-12-10 NOTE — Progress Notes (Signed)
 "  Follow-Up Visit   Subjective  Raven [name] is a 84 y.o. female who presents for the following:  here concerning a rough spot at left cheek  Hx of isk and aks   The patient has spots, moles and lesions to be evaluated, some may be new or changing and the patient may have concern these could be cancer.   The following portions of the chart were reviewed this encounter and updated as appropriate: medications, allergies, medical history  Review of Systems:  No other skin or systemic complaints except as noted in HPI or Assessment and Plan.  Objective  Well appearing patient in no apparent distress; mood and affect are within normal limits.   A focused examination was performed of the following areas: Face , hands  Relevant exam findings are noted in the Assessment and Plan.  left inferior cheek x 2, right cheek zygoma x 3, left hand x 1 (6) Erythematous stuck-on, waxy papule or plaque left cheek x 1 Erythematous thin papules/macules with gritty scale.   Assessment & Plan    ACTINIC DAMAGE - chronic, secondary to cumulative UV radiation exposure/sun exposure over time - diffuse scaly erythematous macules with underlying dyspigmentation - Recommend daily broad spectrum sunscreen SPF 30+ to sun-exposed areas, reapply every 2 hours as needed.  - Recommend staying in the shade or wearing long sleeves, sun glasses (UVA+UVB protection) and wide brim hats (4-inch brim around the entire circumference of the hat). - Call for new or changing lesions.  SEBORRHEIC KERATOSIS - Stuck-on, waxy, tan-brown papules and/or plaques  - Benign-appearing - Discussed benign etiology and prognosis. - Observe - Call for any changes  LENTIGINES Exam: scattered tan macules Due to sun exposure Treatment Plan: Benign-appearing, observe. Recommend daily broad spectrum sunscreen SPF 30+ to sun-exposed areas, reapply every 2 hours as needed.  Call for any changes    INFLAMED SEBORRHEIC KERATOSIS  (6) left inferior cheek x 2, right cheek zygoma x 3, left hand x 1 (6) Symptomatic, irritating, patient would like treated.  Discussed if not resolved after 2 months to call or send mychart will recheck areas  Otherwise will recheck at next followup - Destruction of lesion - left inferior cheek x 2, right cheek zygoma x 3, left hand x 1 (6) Complexity: simple   Destruction method: cryotherapy   Informed consent: discussed and consent obtained   Timeout:  patient name, date of birth, surgical site, and procedure verified Lesion destroyed using liquid nitrogen: Yes   Region frozen until ice ball extended beyond lesion: Yes   Outcome: patient tolerated procedure well with no complications   Post-procedure details: wound care instructions given    ACTINIC KERATOSIS left cheek x 1 Actinic keratoses are precancerous spots that appear secondary to cumulative UV radiation exposure/sun exposure over time. They are chronic with expected duration over 1 year. A portion of actinic keratoses will progress to squamous cell carcinoma of the skin. It is not possible to reliably predict which spots will progress to skin cancer and so treatment is recommended to prevent development of skin cancer.  Recommend daily broad spectrum sunscreen SPF 30+ to sun-exposed areas, reapply every 2 hours as needed.  Recommend staying in the shade or wearing long sleeves, sun glasses (UVA+UVB protection) and wide brim hats (4-inch brim around the entire circumference of the hat). Call for new or changing lesions. - Destruction of lesion - left cheek x 1 Complexity: simple   Destruction method: cryotherapy   Informed consent: discussed  and consent obtained   Timeout:  patient name, date of birth, surgical site, and procedure verified Lesion destroyed using liquid nitrogen: Yes   Region frozen until ice ball extended beyond lesion: Yes   Outcome: patient tolerated procedure well with no complications   Post-procedure  details: wound care instructions given     Return for keep follow up as scheduled .  IEleanor Blush, CMA, am acting as scribe for Alm Rhyme, MD.   Documentation: I have reviewed the above documentation for accuracy and completeness, and I agree with the above.  Alm Rhyme, MD    "

## 2024-12-10 NOTE — Patient Instructions (Addendum)
 Discussed if spots treated today at right and left cheek or other areas are not resolved after 2 months to call or send mychart    Actinic keratoses are precancerous spots that appear secondary to cumulative UV radiation exposure/sun exposure over time. They are chronic with expected duration over 1 year. A portion of actinic keratoses will progress to squamous cell carcinoma of the skin. It is not possible to reliably predict which spots will progress to skin cancer and so treatment is recommended to prevent development of skin cancer.  Recommend daily broad spectrum sunscreen SPF 30+ to sun-exposed areas, reapply every 2 hours as needed.  Recommend staying in the shade or wearing long sleeves, sun glasses (UVA+UVB protection) and wide brim hats (4-inch brim around the entire circumference of the hat). Call for new or changing lesions.    Cryotherapy Aftercare  Wash gently with soap and water everyday.   Apply Vaseline and Band-Aid daily until healed.   Seborrheic Keratosis  What causes seborrheic keratoses? Seborrheic keratoses are harmless, common skin growths that first appear during adult life.  As time goes by, more growths appear.  Some people may develop a large number of them.  Seborrheic keratoses appear on both covered and uncovered body parts.  They are not caused by sunlight.  The tendency to develop seborrheic keratoses can be inherited.  They vary in color from skin-colored to gray, brown, or even black.  They can be either smooth or have a rough, warty surface.   Seborrheic keratoses are superficial and look as if they were stuck on the skin.  Under the microscope this type of keratosis looks like layers upon layers of skin.  That is why at times the top layer may seem to fall off, but the rest of the growth remains and re-grows.    Treatment Seborrheic keratoses do not need to be treated, but can easily be removed in the office.  Seborrheic keratoses often cause symptoms when  they rub on clothing or jewelry.  Lesions can be in the way of shaving.  If they become inflamed, they can cause itching, soreness, or burning.  Removal of a seborrheic keratosis can be accomplished by freezing, burning, or surgery. If any spot bleeds, scabs, or grows rapidly, please return to have it checked, as these can be an indication of a skin cancer.    Due to recent changes in healthcare laws, you may see results of your pathology and/or laboratory studies on MyChart before the doctors have had a chance to review them. We understand that in some cases there may be results that are confusing or concerning to you. Please understand that not all results are received at the same time and often the doctors may need to interpret multiple results in order to provide you with the best plan of care or course of treatment. Therefore, we ask that you please give us  2 business days to thoroughly review all your results before contacting the office for clarification. Should we see a critical lab result, you will be contacted sooner.   If You Need Anything After Your Visit  If you have any questions or concerns for your doctor, please call our main line at [phone] and press option 4 to reach your doctor's medical assistant. If no one answers, please leave a voicemail as directed and we will return your call as soon as possible. Messages left after 4 pm will be answered the following business day.   You may also send us   a message via MyChart. We typically respond to MyChart messages within 1-2 business days.  For prescription refills, please ask your pharmacy to contact our office. Our fax number is [phone].  If you have an urgent issue when the clinic is closed that cannot wait until the next business day, you can page your doctor at the number below.    Please note that while we do our best to be available for urgent issues outside of office hours, we are not available 24/7.   If you have an  urgent issue and are unable to reach us , you may choose to seek medical care at your doctor's office, retail clinic, urgent care center, or emergency room.  If you have a medical emergency, please immediately call 911 or go to the emergency department.  Pager Numbers  - Dr. Hester: [phone]  - Dr. Jackquline: [phone]  - Dr. Claudene: [phone]   - Dr. Raymund: [phone]  In the event of inclement weather, please call our main line at [phone] for an update on the status of any delays or closures.  Dermatology Medication Tips: Please keep the boxes that topical medications come in in order to help keep track of the instructions about where and how to use these. Pharmacies typically print the medication instructions only on the boxes and not directly on the medication tubes.   If your medication is too expensive, please contact our office at [phone] option 4 or send us  a message through MyChart.   We are unable to tell what your co-pay for medications will be in advance as this is different depending on your insurance coverage. However, we may be able to find a substitute medication at lower cost or fill out paperwork to get insurance to cover a needed medication.   If a prior authorization is required to get your medication covered by your insurance company, please allow us  1-2 business days to complete this process.  Drug prices often vary depending on where the prescription is filled and some pharmacies may offer cheaper prices.  The website www.goodrx.com contains coupons for medications through different pharmacies. The prices here do not account for what the cost may be with help from insurance (it may be cheaper with your insurance), but the website can give you the price if you did not use any insurance.  - You can print the associated coupon and take it with your prescription to the pharmacy.  - You may also stop by our office during regular business hours and  pick up a GoodRx coupon card.  - If you need your prescription sent electronically to a different pharmacy, notify our office through [facility] or by phone at [phone] option 4.     Si Usted Necesita Algo Despus de Su Visita  Tambin puede enviarnos un mensaje a travs de Clinical Cytogeneticist. Por lo general respondemos a los mensajes de MyChart en el transcurso de 1 a 2 das hbiles.  Para renovar recetas, por favor pida a su farmacia que se ponga en contacto con nuestra oficina. Randi lakes de fax es [place] [phone].  Si tiene un asunto urgente cuando la clnica est cerrada y que no puede esperar hasta el siguiente da hbil, puede llamar/localizar a su doctor(a) al nmero que aparece a continuacin.   Por favor, tenga en cuenta que aunque hacemos todo lo posible para estar disponibles para asuntos urgentes fuera del horario de [place], no estamos disponibles las 24 horas del da, los 7 [address]  [address] de la [place].  Si tiene un problema urgente y no puede comunicarse con nosotros, puede optar por buscar atencin mdica  en el consultorio de su doctor(a), en una clnica privada, en un centro de atencin urgente o en una sala de emergencias.  Si tiene engineer, drilling, por favor llame inmediatamente al 911 o vaya a la sala de emergencias.  Nmeros de bper  - Dr. Hester: [phone]  - Dra. Jackquline: [phone]  - Dr. Claudene: [phone]  - Dra. Kitts: [phone]  En caso de inclemencias del [place], por favor llame a nuestra lnea principal al [phone] para una actualizacin sobre el estado de cualquier retraso o cierre.  Consejos para la medicacin en dermatologa: Por favor, guarde las cajas en las que vienen los medicamentos de uso tpico para ayudarle a seguir las instrucciones sobre dnde y cmo usarlos. Las farmacias generalmente imprimen las instrucciones del medicamento slo en las cajas y no directamente en los tubos del [place].   Si su medicamento es muy  caro, por favor, pngase en contacto con landry rieger llamando al [phone] y presione la opcin 4 o envenos un mensaje a travs de Clinical Cytogeneticist.   No podemos decirle cul ser su copago por los medicamentos por adelantado ya que esto es diferente dependiendo de la cobertura de su seguro. Sin embargo, es posible que podamos encontrar un medicamento sustituto a audiological scientist un formulario para que el seguro cubra el medicamento que se considera necesario.   Si se requiere una autorizacin previa para que su compaa de seguros cubra su medicamento, por favor permtanos de 1 a 2 das hbiles para completar este proceso.  Los precios de los medicamentos varan con frecuencia dependiendo del environmental consultant de dnde se surte la receta y alguna farmacias pueden ofrecer precios ms baratos.  El sitio web www.goodrx.com tiene cupones para medicamentos de health and safety inspector. Los precios aqu no tienen en cuenta lo que podra costar con la ayuda del seguro (puede ser ms barato con su seguro), pero el sitio web puede darle el precio si no utiliz tourist information centre manager.  - Puede imprimir el cupn correspondiente y llevarlo con su receta a la farmacia.  - Tambin puede pasar por nuestra oficina durante el horario de atencin regular y education officer, museum una tarjeta de cupones de GoodRx.  - Si necesita que su receta se enve electrnicamente a una farmacia diferente, informe a nuestra oficina a travs de MyChart de [place] o por telfono llamando al [phone] y presione la opcin 4.

## 2025-06-10 ENCOUNTER — Ambulatory Visit: Admitting: Internal Medicine

## 2025-07-09 ENCOUNTER — Ambulatory Visit

## 2025-09-22 ENCOUNTER — Ambulatory Visit: Admitting: Dermatology
